# Patient Record
Sex: Female | Born: 1980 | Race: Black or African American | Hispanic: No | Marital: Single | Smoking: Current every day smoker
Health system: Southern US, Community
[De-identification: ages and names within clinical notes are randomized; demographics above are authoritative.]

## PROBLEM LIST (undated history)

## (undated) ENCOUNTER — Inpatient Hospital Stay (HOSPITAL_COMMUNITY): Payer: Self-pay

## (undated) DIAGNOSIS — A549 Gonococcal infection, unspecified: Secondary | ICD-10-CM

## (undated) DIAGNOSIS — D649 Anemia, unspecified: Secondary | ICD-10-CM

## (undated) DIAGNOSIS — F419 Anxiety disorder, unspecified: Secondary | ICD-10-CM

## (undated) DIAGNOSIS — F32A Depression, unspecified: Secondary | ICD-10-CM

## (undated) DIAGNOSIS — R0602 Shortness of breath: Secondary | ICD-10-CM

## (undated) DIAGNOSIS — L989 Disorder of the skin and subcutaneous tissue, unspecified: Secondary | ICD-10-CM

## (undated) DIAGNOSIS — K439 Ventral hernia without obstruction or gangrene: Secondary | ICD-10-CM

## (undated) DIAGNOSIS — K029 Dental caries, unspecified: Secondary | ICD-10-CM

## (undated) DIAGNOSIS — F329 Major depressive disorder, single episode, unspecified: Secondary | ICD-10-CM

## (undated) DIAGNOSIS — IMO0002 Reserved for concepts with insufficient information to code with codable children: Secondary | ICD-10-CM

## (undated) HISTORY — PX: DILATION AND CURETTAGE OF UTERUS: SHX78

## (undated) HISTORY — DX: Disorder of the skin and subcutaneous tissue, unspecified: L98.9

## (undated) HISTORY — PX: INDUCED ABORTION: SHX677

## (undated) HISTORY — DX: Dental caries, unspecified: K02.9

## (undated) HISTORY — PX: TUBAL LIGATION: SHX77

---

## 2011-02-01 NOTE — L&D Delivery Note (Signed)
Delivery Note At 8:10 AM a viable female was delivered via Vaginal, Spontaneous Delivery (Presentation: Left Occiput Anterior).  APGAR: 9, 9; weight 8 lb 6.9 oz (3825 g).   Placenta status: Intact, Spontaneous.  Cord: 3 vessels with the following complications: None.    Anesthesia: Epidural  Episiotomy: None  Lacerations: None Suture Repair: None Est. Blood Loss (mL):   Mom to postpartum.  Baby to stable and to mothers skin.Marland Kitchen  Kuneff, Renee 11/09/2011, 9:02 AM

## 2011-02-01 NOTE — L&D Delivery Note (Signed)
I have seen and examined this patient and I agree with the above. Elizabeth Mendoza 9:15 AM 11/11/2011

## 2011-03-15 ENCOUNTER — Encounter (HOSPITAL_BASED_OUTPATIENT_CLINIC_OR_DEPARTMENT_OTHER): Payer: Self-pay | Admitting: *Deleted

## 2011-03-15 ENCOUNTER — Emergency Department (HOSPITAL_BASED_OUTPATIENT_CLINIC_OR_DEPARTMENT_OTHER)
Admission: EM | Admit: 2011-03-15 | Discharge: 2011-03-15 | Disposition: A | Discharge status: Home / Self Care | Payer: Medicaid Other | Attending: Emergency Medicine | Admitting: Emergency Medicine

## 2011-03-15 DIAGNOSIS — F17200 Nicotine dependence, unspecified, uncomplicated: Secondary | ICD-10-CM | POA: Insufficient documentation

## 2011-03-15 DIAGNOSIS — O210 Mild hyperemesis gravidarum: Secondary | ICD-10-CM | POA: Insufficient documentation

## 2011-03-15 DIAGNOSIS — R109 Unspecified abdominal pain: Secondary | ICD-10-CM | POA: Insufficient documentation

## 2011-03-15 DIAGNOSIS — Z3201 Encounter for pregnancy test, result positive: Secondary | ICD-10-CM

## 2011-03-15 DIAGNOSIS — R110 Nausea: Secondary | ICD-10-CM

## 2011-03-15 LAB — URINALYSIS, ROUTINE W REFLEX MICROSCOPIC
Bilirubin Urine: NEGATIVE
Nitrite: NEGATIVE
Specific Gravity, Urine: 1.031 — ABNORMAL HIGH (ref 1.005–1.030)
Urobilinogen, UA: 1 mg/dL (ref 0.0–1.0)

## 2011-03-15 MED ORDER — PRENATAL COMPLETE 14-0.4 MG PO TABS: 1.0000 | ORAL_TABLET | Freq: Every morning | ORAL | Status: DC

## 2011-03-15 NOTE — ED Notes (Signed)
Karen Sofia PA-C at bedside 

## 2011-03-15 NOTE — ED Notes (Signed)
Pt c/o generalized abd pain x 2 weeks

## 2011-03-15 NOTE — Discharge Instructions (Signed)
Pregnancy If you are planning on getting pregnant, it is a good idea to make a preconception appointment with your care- giver to discuss having a healthy lifestyle before getting pregnant. Such as, diet, weight, exercise, taking prenatal vitamins especially folic acid (it helps prevent brain and spinal cord defects), avoiding alcohol, smoking and illegal drugs, medical problems (diabetes, convulsions), family history of genetic problems, working conditions and immunizations. It is better to have knowledge of these things and do something about them before getting pregnant. In your pregnancy, it is important to follow certain guidelines to have a healthy baby. It is very important to get good prenatal care and follow your caregiver's instructions. Prenatal care includes all the medical care you receive before your baby's birth. This helps to prevent problems during the pregnancy and childbirth. HOME CARE INSTRUCTIONS   Start your prenatal visits by the 12th week of pregnancy or before when possible. They are usually scheduled monthly at first. They are more often in the last 2 months before delivery. It is important that you keep your caregiver's appointments and follow your caregiver's instructions regarding medication use, exercise, and diet.   During pregnancy, you are providing food for you and your baby. Eat a regular, well-balanced diet. Choose foods such as meat, fish, milk and other dairy products, vegetables, fruits, whole-grain breads and cereals. Your caregiver will inform you of the ideal weight gain depending on your current height and weight. Drink lots of liquids. Try to drink 8 glasses of water a day.   Alcohol is associated with a number of birth defects including fetal alcohol syndrome. It is best to avoid alcohol completely. Smoking will cause low birth rate and prematurity. Use of alcohol and nicotine during your pregnancy also increases the chances that your child will be chemically  dependent later in their life and may contribute to SIDS (Sudden Infant Death Syndrome).   Do not use illegal drugs.   Only take prescription or over-the-counter medications that are recommended by your caregiver. Other medications can cause genetic and physical problems in the baby.   Morning sickness can often be helped by keeping soda crackers at the bedside. Eat a couple before arising in the morning.   A sexual relationship may be continued until near the end of pregnancy if there are no other problems such as early (premature) leaking of amniotic fluid from the membranes, vaginal bleeding, painful intercourse or belly (abdominal) pain.   Exercise regularly. Check with your caregiver if you are unsure of the safety of some of your exercises.   Do not use hot tubs, steam rooms or saunas. These increase the risk of fainting or passing out and hurting yourself and the baby. Swimming is OK for exercise. Get plenty of rest, including afternoon naps when possible especially in the third trimester.   Avoid toxic odors and chemicals.   Do not wear high heels. They may cause you to lose your balance and fall.   Do not lift over 5 pounds. If you do lift anything, lift with your legs and thighs, not your back.   Avoid long trips, especially in the third trimester.   If you have to travel out of the city or state, take a copy of your medical records with you.  SEEK IMMEDIATE MEDICAL CARE IF:   You develop an unexplained oral temperature above 102 F (38.9 C), or as your caregiver suggests.   You have leaking of fluid from the vagina. If leaking membranes are suspected, take   your temperature and inform your caregiver of this when you call.   There is vaginal spotting or bleeding. Notify your caregiver of the amount and how many pads are used.   You continue to feel sick to your stomach (nauseous) and have no relief from remedies suggested, or you throw up (vomit) blood or coffee ground like  materials.   You develop upper abdominal pain.   You have round ligament discomfort in the lower abdominal area. This still must be evaluated by your caregiver.   You feel contractions of the uterus.   You do not feel the baby move, or there is less movement than before.   You have painful urination.   You have abnormal vaginal discharge.   You have persistent diarrhea.   You get a severe headache.   You have problems with your vision.   You develop muscle weakness.   You feel dizzy and faint.   You develop shortness of breath.   You develop chest pain.   You have back pain that travels down to your leg and feet.   You feel irregular or a very fast heartbeat.   You develop excessive weight gain in a short period of time (5 pounds in 3 to 5 days).   You are involved with a domestic violence situation.  Document Released: 01/17/2005 Document Revised: 09/29/2010 Document Reviewed: 07/11/2008 [employer] Patient Information 2012 [employer]. 

## 2011-03-15 NOTE — ED Provider Notes (Signed)
Medical screening examination/treatment/procedure(s) were performed by non-physician practitioner and as supervising physician I was immediately available for consultation/collaboration.  Ethelda Chick, MD 03/15/11 [phone]

## 2011-03-15 NOTE — ED Provider Notes (Signed)
History     CSN: [id number]  Arrival date & time 03/15/11  1816   First MD Initiated Contact with Patient 03/15/11 1911      Chief Complaint  Patient presents with  . Abdominal Pain    (Consider location/radiation/quality/duration/timing/severity/associated sxs/prior treatment) Patient is a 31 y.o. female presenting with abdominal pain. The history is provided by the patient. No language interpreter was used.  Abdominal Pain The primary symptoms of the illness include abdominal pain and nausea. The primary symptoms of the illness do not include vomiting. Episode onset: 2 weeks. The onset of the illness was gradual. The problem has not changed since onset. Associated with: possible pregnancy. The patient states that she believes she is currently pregnant. The patient has not had a change in bowel habit. Symptoms associated with the illness do not include chills.  Pt reports she has been nauseated for 2 weeks.  Pt concerned that she could be pregnant.  Pt reports her boyfriend has been taking a supplement.  Pt concerned this could effect her.  History reviewed. No pertinent past medical history.  Past Surgical History  Procedure Date  . Induced abortion     History reviewed. No pertinent family history.  History  Substance Use Topics  . Smoking status: Current Everyday Smoker  . Smokeless tobacco: Not on file  . Alcohol Use: No    OB History    Grav Para Term Preterm Abortions TAB SAB Ect Mult Living                  Review of Systems  Constitutional: Negative for chills.  Gastrointestinal: Positive for nausea and abdominal pain. Negative for vomiting.  All other systems reviewed and are negative.    Allergies  Review of patient's allergies indicates no known allergies.  Home Medications  No current outpatient prescriptions on file.  BP 132/79  Pulse 101  Temp(Src) 98.7 F (37.1 C) (Oral)  Resp 16  Ht 5\' 4"  (1.626 m)  Wt 200 lb (90.719 kg)  BMI 34.33 kg/m2   SpO2 100%  LMP 02/15/2011  Physical Exam  Nursing note and vitals reviewed. Constitutional: She is oriented to person, place, and time. She appears well-developed and well-nourished.  HENT:  Head: Normocephalic and atraumatic.  Right Ear: External ear normal.  Left Ear: External ear normal.  Nose: Nose normal.  Mouth/Throat: Oropharynx is clear and moist.  Eyes: Conjunctivae and EOM are normal. Pupils are equal, round, and reactive to light.  Neck: Normal range of motion. Neck supple.  Cardiovascular: Normal rate and normal heart sounds.   Pulmonary/Chest: Effort normal and breath sounds normal.  Abdominal: Soft.  Musculoskeletal: Normal range of motion.  Neurological: She is alert and oriented to person, place, and time. She has normal reflexes.  Skin: Skin is warm.  Psychiatric: She has a normal mood and affect.    ED Course  Procedures (including critical care time)  Labs Reviewed  URINALYSIS, ROUTINE W REFLEX MICROSCOPIC - Abnormal; Notable for the following:    Specific Gravity, Urine 1.031 (*)    Ketones, ur 15 (*)    All other components within normal limits  PREGNANCY, URINE - Abnormal; Notable for the following:    Preg Test, Ur POSITIVE (*)    All other components within normal limits   No results found.   No diagnosis found.    MDM   Results for orders placed during the hospital encounter of 03/15/11  URINALYSIS, ROUTINE W REFLEX MICROSCOPIC  Component Value Range   Color, Urine YELLOW  YELLOW    APPearance CLEAR  CLEAR    Specific Gravity, Urine 1.031 (*) 1.005 - 1.030    pH 6.0  5.0 - 8.0    Glucose, UA NEGATIVE  NEGATIVE (mg/dL)   Hgb urine dipstick NEGATIVE  NEGATIVE    Bilirubin Urine NEGATIVE  NEGATIVE    Ketones, ur 15 (*) NEGATIVE (mg/dL)   Protein, ur NEGATIVE  NEGATIVE (mg/dL)   Urobilinogen, UA 1.0  0.0 - 1.0 (mg/dL)   Nitrite NEGATIVE  NEGATIVE    Leukocytes, UA NEGATIVE  NEGATIVE   PREGNANCY, URINE      Component Value Range    Preg Test, Ur POSITIVE (*) NEGATIVE    No results found.     Pt advised to go to [facility] hospital if any problems.   Pt started on prenatal vitamins    Langston Masker, [place] 03/15/11 1945  Langston Masker, [place] 03/15/11 [phone]

## 2011-04-11 ENCOUNTER — Emergency Department (HOSPITAL_COMMUNITY): Payer: Medicaid Other

## 2011-04-11 ENCOUNTER — Encounter (HOSPITAL_COMMUNITY): Payer: Self-pay | Admitting: *Deleted

## 2011-04-11 ENCOUNTER — Emergency Department (HOSPITAL_COMMUNITY)
Admission: EM | Admit: 2011-04-11 | Discharge: 2011-04-11 | Disposition: A | Discharge status: Home / Self Care | Payer: Medicaid Other | Attending: Emergency Medicine | Admitting: Emergency Medicine

## 2011-04-11 DIAGNOSIS — Z1389 Encounter for screening for other disorder: Secondary | ICD-10-CM | POA: Insufficient documentation

## 2011-04-11 DIAGNOSIS — N898 Other specified noninflammatory disorders of vagina: Secondary | ICD-10-CM | POA: Insufficient documentation

## 2011-04-11 DIAGNOSIS — Z3490 Encounter for supervision of normal pregnancy, unspecified, unspecified trimester: Secondary | ICD-10-CM

## 2011-04-11 DIAGNOSIS — N949 Unspecified condition associated with female genital organs and menstrual cycle: Secondary | ICD-10-CM | POA: Insufficient documentation

## 2011-04-11 DIAGNOSIS — R109 Unspecified abdominal pain: Secondary | ICD-10-CM | POA: Insufficient documentation

## 2011-04-11 DIAGNOSIS — Z363 Encounter for antenatal screening for malformations: Secondary | ICD-10-CM | POA: Insufficient documentation

## 2011-04-11 LAB — URINALYSIS, ROUTINE W REFLEX MICROSCOPIC
Bilirubin Urine: NEGATIVE
Glucose, UA: NEGATIVE mg/dL
Ketones, ur: NEGATIVE mg/dL
Leukocytes, UA: NEGATIVE
Protein, ur: NEGATIVE mg/dL

## 2011-04-11 LAB — WET PREP, GENITAL: Clue Cells Wet Prep HPF POC: NONE SEEN

## 2011-04-11 LAB — HCG, QUANTITATIVE, PREGNANCY: hCG, Beta Chain, Quant, S: 151329 m[IU]/mL — ABNORMAL HIGH (ref <5)

## 2011-04-11 NOTE — ED Notes (Signed)
Pt alert and oriented x4. Respirations even and unlabored, bilateral symmetrical rise and fall of chest. Skin warm and dry. In no acute distress. Denies needs.   

## 2011-04-11 NOTE — ED Notes (Addendum)
Pt reports abdominal pain that started approximately 1 week ago. States is pregnant, but has not seen OBGYN and unsure how far along she is. States LMP in January. Has had 7 pregnancies, 3 abortions, 3 living children. Reports foul smell/white vaginal discharge.

## 2011-04-11 NOTE — ED Provider Notes (Signed)
History     CSN: [id number]  Arrival date & time 04/11/11  1050   First MD Initiated Contact with Patient 04/11/11 1152      Chief Complaint  Patient presents with  . Abdominal Pain    (Consider location/radiation/quality/duration/timing/severity/associated sxs/prior treatment) HPI  Patient presents to ER complaining of lower abdominal cramping, leg cramping and pregnancy. Patient states she was seen in ER last month for nausea and "belly not feeling right" and was told she was pregnant. Patient states she is "currently working on getting set up with an ObGyn" but has not seen one for this pregnancy. Patient is [id number]. Patient states that last night she had acute onset lower abdominal cramping and cramping in thighs that lasted a few minutes and then resolved. Since onset mild intermittent return of lower abdominal cramping. She denies vaginal bleeding but states she has a thickened foul smelling vaginal d/c. Patient states her last "really normal" LMP was in December but that in January she did have 2 days of heavy bleeding on the 17th and 18th but states no period in February or March as of today's date.   History reviewed. No pertinent past medical history.  Past Surgical History  Procedure Date  . Induced abortion     No family history on file.  History  Substance Use Topics  . Smoking status: Former Smoker    Types: Cigarettes  . Smokeless tobacco: Not on file  . Alcohol Use: No    OB History    Grav Para Term Preterm Abortions TAB SAB Ect Mult Living   1               Review of Systems  All other systems reviewed and are negative.    Allergies  Review of patient's allergies indicates no known allergies.  Home Medications   Current Outpatient Rx  Name Route Sig Dispense Refill  . PRENATAL COMPLETE 14-0.4 MG PO TABS Oral Take 1 tablet by mouth every morning. 60 each 0    BP 105/56  Pulse 93  Temp(Src) 98 F (36.7 C) (Oral)  Resp 16  Ht 5\' 4"  (1.626  m)  Wt 216 lb (97.977 kg)  BMI 37.08 kg/m2  SpO2 100%  LMP 02/15/2011  Physical Exam  Nursing note and vitals reviewed. Constitutional: She is oriented to person, place, and time. She appears well-developed and well-nourished. No distress.  HENT:  Head: Normocephalic and atraumatic.  Eyes: Conjunctivae are normal.  Neck: Normal range of motion. Neck supple.  Cardiovascular: Normal rate, regular rhythm, normal heart sounds and intact distal pulses.  Exam reveals no gallop and no friction rub.   No murmur heard. Pulmonary/Chest: Effort normal and breath sounds normal. No respiratory distress. She has no wheezes. She has no rales. She exhibits no tenderness.  Abdominal: Soft. Bowel sounds are normal. She exhibits no distension and no mass. There is no tenderness. There is no rebound and no guarding.  Genitourinary: Vagina normal. There is no rash, tenderness or lesion on the right labia. There is no rash, tenderness or lesion on the left labia. Cervix exhibits discharge. Cervix exhibits no motion tenderness and no friability. Right adnexum displays no mass, no tenderness and no fullness. Left adnexum displays no mass, no tenderness and no fullness. No tenderness around the vagina.       Cervical os closed. Scant white d/c. Uterus non tender   Musculoskeletal: Normal range of motion. She exhibits no edema and no tenderness.  Neurological: She is alert  and oriented to person, place, and time.  Skin: Skin is warm and dry. No rash noted. She is not diaphoretic. No erythema.  Psychiatric: She has a normal mood and affect.    ED Course  Procedures (including critical care time)  Labs Reviewed  URINALYSIS, ROUTINE W REFLEX MICROSCOPIC - Abnormal; Notable for the following:    Color, Urine AMBER (*) BIOCHEMICALS MAY BE AFFECTED BY COLOR   APPearance CLOUDY (*)    All other components within normal limits  POCT PREGNANCY, URINE - Abnormal; Notable for the following:    Preg Test, Ur POSITIVE  (*)    All other components within normal limits  WET PREP, GENITAL  GC/CHLAMYDIA PROBE AMP, GENITAL  HCG, QUANTITATIVE, PREGNANCY   US Ob Comp Less 14 Wks  04/11/2011  *RADIOLOGY REPORT*  Clinical Data: Pelvic pain with positive pregnancy test.  OBSTETRIC <14 WK [place] AND TRANSVAGINAL OB US  Technique:  Both transabdominal and transvaginal ultrasound examinations were performed for complete evaluation of the gestation as well as the maternal uterus, adnexal regions, and pelvic cul-de-sac.  Transvaginal technique was performed to assess early pregnancy.  Comparison:  None.  Intrauterine gestational sac:  Single gestational sac. Visualized/normal in shape. Yolk sac: Visualized Embryo: Visualized Cardiac Activity: Visualized Heart Rate: 162 bpm  CRL: 17   mm  8   w  1   d          [place] EDC: 11/20/2011  Maternal uterus/adnexae: No evidence for subchorionic hemorrhage. Two posterior intramural fibroids are identified, measuring up to about the 4.5 cm in maximum diameter. Corpus luteum cyst identified in the right ovary. Left ovary is unremarkable.  IMPRESSION: Single living intrauterine gestation at estimated 8-week-1-day gestational age by crown-rump length.  Original Report Authenticated By: ERIC A. MANSELL, M.D.   US Ob Transvaginal  04/11/2011  *RADIOLOGY REPORT*  Clinical Data: Pelvic pain with positive pregnancy test.  OBSTETRIC <14 WK [place] AND TRANSVAGINAL OB US  Technique:  Both transabdominal and transvaginal ultrasound examinations were performed for complete evaluation of the gestation as well as the maternal uterus, adnexal regions, and pelvic cul-de-sac.  Transvaginal technique was performed to assess early pregnancy.  Comparison:  None.  Intrauterine gestational sac:  Single gestational sac. Visualized/normal in shape. Yolk sac: Visualized Embryo: Visualized Cardiac Activity: Visualized Heart Rate: 162 bpm  CRL: 17   mm  8   w  1   d          [place] EDC: 11/20/2011  Maternal uterus/adnexae: No evidence for  subchorionic hemorrhage. Two posterior intramural fibroids are identified, measuring up to about the 4.5 cm in maximum diameter. Corpus luteum cyst identified in the right ovary. Left ovary is unremarkable.  IMPRESSION: Single living intrauterine gestation at estimated 8-week-1-day gestational age by crown-rump length.  Original Report Authenticated By: ERIC A. MANSELL, M.D.     1. Normal IUP (intrauterine pregnancy) on prenatal ultrasound       MDM  Single living IUP at 8 weeks with abdomen soft and non tender and no CMT or adnexal TTP. Intermittent cramping to be followed up with prenatal visit with Ob[facility]yn at [facility] or acute changes with abdominal pain seen either at [facility] or [facility] ER. Patient is agreeable to plan.         Jenness Corner, [place] 04/11/11 1423

## 2011-04-11 NOTE — ED Provider Notes (Signed)
Medical screening examination/treatment/procedure(s) were performed by non-physician practitioner and as supervising physician I was immediately available for consultation/collaboration.   Glynn Octave, MD 04/11/11 1932

## 2011-04-11 NOTE — Discharge Instructions (Signed)
Start a pre natal vitamin. You may alternate between low dose ibuprofen and tylenol for abdominal cramping and pain. Call [facility] today to schedule follow up pre natal visits but go to [facility] hospital for increasing abdominal cramping for further evaluation.   Birth Defects, Prevention A birth defect is an abnormal condition present at birth (congenital). Only 2% to 3% of babies may have a serious birth defect. Some birth defects can be treated with medications. Others can be treated with diet or surgery. Many birth defects can be prevented but some cannot. Death from a birth defect is rare.  Many birth defects can be found through screening during pregnancy. However, screening is not 100% accurate:  A test may show positive results for a defect when the child is normal (false positive).   A test result may show there is no problem when there really is a defect (false negative).  Some of the screening tests include:  An ultrasound.   Blood tests for the mother.   A test of the chromosomes (chorionic villus sampling).   Amniotic fluid testing (amniocentesis).   A test of blood taken from the umbilical cord of the fetus (cordocentesis).  CAUSES  Most birth defects have unknown causes. However, some defects have been traced to specific causes, such as:  Genetic defects can be caused by abnormal genes or chromosomes. These include:   Cystic fibrosis.   Sickle cell disease.   Tay-Sachs Disease.   Fragile X syndrome.   Down syndrome.   Thalassemia.   Infectious diseases. These include:   [place] measles.   Cytomegalovirus.   Toxoplasmosis.   Parvovirus.   Chickenpox.   Syphilis.   Herpes virus.   Uncontrolled diabetes before and during pregnancy.   Reoccurring convulsions (epilepsy).   Chemical agents such as:   Alcohol, which can cause lifelong physical and mental disabilities in the baby. This is called fetal alcohol syndrome (FAS).   Mercury, which  is found in certain fish (shark, swordfish, and tilefish).   Lead, which is usually found in paint.   Illegal drugs.   Cigarette smoke.   Certain prescription, over-the-counter, and herbal medications. If you are taking a prescription medication, talk to your caregiver before stopping it.   Radiation.   Chemotherapy.   Lack of folic acid, a B vitamin, before and during pregnancy.   Lack of iodine before and during your pregnancy.  RISK FACTORS  Age of mother is 35 years or older.   Age of father is 50 years or older.   Mother with a history of diabetes.   Mother with a history of epilepsy.   A history of a birth defect in the parents.   A previous baby with a birth defect.   A family history of a birth defect.  HOME CARE INSTRUCTIONS AND PREVENTION  See your caregiver as soon as you think you are pregnant.   Do not drink alcohol before or during pregnancy.   Do not take illegal drugs.   Do not smoke before (when possible) and during the pregnancy.   Before getting pregnant, begin taking a multivitamin as directed by your caregiver.   Take folic acid (0.4 mg) before and during pregnancy. It helps prevent spina bifida and a condition where the brain is undeveloped (anencephaly). Multivitamins usually contain folic acid. It is also found in most breakfast cereals.   Follow the directions from your caregiver about taking iodine. Your caregiver may suggest that you take 150 micrograms of iodine  before and during your pregnancy. Multivitamins usually contain iodine.   Do not take more than 5,000 international units of vitamin A a day.   Eat a balanced and nutritious diet.   Avoid fish that contain mercury (shark, swordfish, and tilefish).   Avoid eating soft cheeses before and during the pregnancy.   Talk to your doctor before taking any prescription, over-the-counter, or herbal medications.   If there is a history of birth defects in the mother, father, previously  born children, or family members, you may wish to have genetic counseling before getting pregnant.   If you are diabetic, make sure your diabetes is controlled before and during your pregnancy.   Get immunizations before becoming pregnant. You may need to be immunized for [place] measles, mumps, red measles, chickenpox, hepatitis, and other diseases. Immunizations received when you are pregnant may be harmful to the baby.   If you are going to have a baby with a birth defect that will need treatment, try to have your baby at a hospital. That way, doctors, intensive care facilities, and equipment to treat and care for the baby's problem will be available.  MAKE SURE YOU:   Understand these instructions.   Will watch your condition.   Will get help right away if you are not doing well or get worse.  Document Released: 11/14/2006 Document Revised: 01/06/2011 Document Reviewed: 05/02/2008 [facility] Patient Information 2012 [place], [place].

## 2011-04-11 NOTE — ED Notes (Signed)
Ultrasound at bedside

## 2011-04-12 LAB — GC/CHLAMYDIA PROBE AMP, GENITAL
Chlamydia, DNA Probe: NEGATIVE
GC Probe Amp, Genital: NEGATIVE

## 2011-05-24 ENCOUNTER — Encounter (HOSPITAL_COMMUNITY): Payer: Self-pay | Admitting: *Deleted

## 2011-05-24 ENCOUNTER — Emergency Department (HOSPITAL_COMMUNITY)
Admission: EM | Admit: 2011-05-24 | Discharge: 2011-05-24 | Disposition: A | Discharge status: Home / Self Care | Payer: Medicaid Other | Attending: Emergency Medicine | Admitting: Emergency Medicine

## 2011-05-24 DIAGNOSIS — R109 Unspecified abdominal pain: Secondary | ICD-10-CM | POA: Insufficient documentation

## 2011-05-24 DIAGNOSIS — O26899 Other specified pregnancy related conditions, unspecified trimester: Secondary | ICD-10-CM

## 2011-05-24 DIAGNOSIS — M549 Dorsalgia, unspecified: Secondary | ICD-10-CM | POA: Insufficient documentation

## 2011-05-24 DIAGNOSIS — O99891 Other specified diseases and conditions complicating pregnancy: Secondary | ICD-10-CM | POA: Insufficient documentation

## 2011-05-24 DIAGNOSIS — N898 Other specified noninflammatory disorders of vagina: Secondary | ICD-10-CM | POA: Insufficient documentation

## 2011-05-24 DIAGNOSIS — R10819 Abdominal tenderness, unspecified site: Secondary | ICD-10-CM | POA: Insufficient documentation

## 2011-05-24 LAB — URINALYSIS, ROUTINE W REFLEX MICROSCOPIC
Bilirubin Urine: NEGATIVE
Nitrite: NEGATIVE
Protein, ur: NEGATIVE mg/dL
Specific Gravity, Urine: 1.025 (ref 1.005–1.030)
Urobilinogen, UA: 1 mg/dL (ref 0.0–1.0)

## 2011-05-24 LAB — WET PREP, GENITAL
Trich, Wet Prep: NONE SEEN
Yeast Wet Prep HPF POC: NONE SEEN

## 2011-05-24 MED ORDER — ACETAMINOPHEN 325 MG PO TABS
650.0000 mg | ORAL_TABLET | Freq: Once | ORAL | Status: AC
  Administered 2011-05-24: 650 mg via ORAL

## 2011-05-24 MED ORDER — ACETAMINOPHEN 325 MG PO TABS
ORAL_TABLET | ORAL | Status: AC
  Filled 2011-05-24: qty 2

## 2011-05-24 NOTE — ED Provider Notes (Signed)
History     CSN: [id number]  Arrival date & time 05/24/11  0714   First MD Initiated Contact with Patient 05/24/11 0719      Chief Complaint  Patient presents with  . Abdominal Pain    (Consider location/radiation/quality/duration/timing/severity/associated sxs/prior treatment) HPI Comments: Elizabeth Mendoza is a 31 y.o. female presents with 2 days of lower abdominal cramping with pain radiating to the back. She denies dysuria, urinary frequency, vaginal discharge or vaginal bleeding. She is approximately [redacted] weeks pregnant based on a ultrasound done in the emergency department on 04/11/11. She has not gotten prenatal care yet. She does take prenatal vitamins. She is gravida 7, with 3 living children, and 3 abortions-spontaneous. She denies fever, chills, nausea, vomiting.  The history is provided by the patient.    History reviewed. No pertinent past medical history.  Past Surgical History  Procedure Date  . Induced abortion     No family history on file.  History  Substance Use Topics  . Smoking status: Former Smoker    Types: Cigarettes  . Smokeless tobacco: Not on file  . Alcohol Use: No    OB History    Grav Para Term Preterm Abortions TAB SAB Ect Mult Living   1               Review of Systems  All other systems reviewed and are negative.    Allergies  Review of patient's allergies indicates no known allergies.  Home Medications   No current outpatient prescriptions on file.  BP 115/68  Pulse 93  Temp(Src) 98.4 F (36.9 C) (Oral)  Resp 18  SpO2 99%  LMP 02/15/2011  Physical Exam  Nursing note and vitals reviewed. Constitutional: She is oriented to person, place, and time. She appears well-developed and well-nourished.  HENT:  Head: Normocephalic and atraumatic.  Eyes: Conjunctivae and EOM are normal. Pupils are equal, round, and reactive to light.  Neck: Normal range of motion and phonation normal. Neck supple.  Cardiovascular: Normal rate, regular  rhythm and intact distal pulses.   Pulmonary/Chest: Effort normal and breath sounds normal. She exhibits no tenderness.  Abdominal: Soft. She exhibits no distension. There is tenderness (Mild suprapubic tenderness). There is no guarding.  Genitourinary:       Pelvic examination: Normal external female genitalia. No rash. Trivial vaginal discharge, white color. Cervix thick and closed. No cervicitis or cervical motion tenderness. Uterus gravid, consistent with an 18 week pregnancy. No uterine tenderness, adnexal mass or adnexal tenderness  Musculoskeletal: Normal range of motion.  Neurological: She is alert and oriented to person, place, and time. She has normal strength. She exhibits normal muscle tone.  Skin: Skin is warm and dry.  Psychiatric: She has a normal mood and affect. Her behavior is normal. Judgment and thought content normal.    ED Course  Procedures (including critical care time)  Patient complains of headache during ED course and was treated with Tylenol.   Labs Reviewed  URINALYSIS, ROUTINE W REFLEX MICROSCOPIC - Abnormal; Notable for the following:    APPearance CLOUDY (*)    Ketones, ur TRACE (*)    All other components within normal limits  WET PREP, GENITAL - Abnormal; Notable for the following:    Clue Cells Wet Prep HPF POC FEW (*)    WBC, Wet Prep HPF POC FEW (*)    All other components within normal limits  URINE CULTURE  GC/CHLAMYDIA PROBE AMP, GENITAL   No results found.   1. Abdominal  pain in pregnancy       MDM  Nonspecific pelvic pain with incidental pregnancy. No apparent pregnancy complication. Doubt UTI, metabolic instability or dehydration. She is stable for discharge with outpatient management. She will need referral for obstetric care.  Plan: Home Medications- oral vitamin; Home Treatments- Tylenol prn pain; Recommended follow up- OB asap        Flint Melter, MD 05/24/11 1110

## 2011-05-24 NOTE — ED Notes (Signed)
Pt refused I&O cath-states "im already in pain, im not gonna have that".  Explained to pt the reason for I&O cath.  Pt still refused.  Dr. Effie Shy notified.  Pt instructed to provide clean catch urine.  Verbalizes understanding of the importance of providing clean catch urine.  Pt ambulated to the BR with steady gait.  No difficulties.

## 2011-05-24 NOTE — ED Notes (Signed)
Pt reports lower abd pain x 2 days.  Pt is ~ 4 mos pregnant.  Pt reports at times pain radiates down to her legs.  Pt also reports lower back pain.  Pt denies any n/v/d or any prenatal care at this time.  Pt was seen here for vaginal discharge last month, was given rx for prenatal vitamins but states that she cannot take them d/t nausea.

## 2011-05-24 NOTE — Discharge Instructions (Signed)
Use the attached resource guide to help you find an obstetrician to see for pregnancy care. For pain. You can take Tylenol for pain. Start taking a daily multivitamin. Drink plenty of fluids. Your pregnancy is approximately 18 weeks today.  Abdominal Pain Abdominal pain can be caused by many things. Your caregiver decides the seriousness of your pain by an examination and possibly blood tests and X-rays. Many cases can be observed and treated at home. Most abdominal pain is not caused by a disease and will probably improve without treatment. However, in many cases, more time must pass before a clear cause of the pain can be found. Before that point, it may not be known if you need more testing, or if hospitalization or surgery is needed. HOME CARE INSTRUCTIONS   Do not take laxatives unless directed by your caregiver.   Take pain medicine only as directed by your caregiver.   Only take over-the-counter or prescription medicines for pain, discomfort, or fever as directed by your caregiver.   Try a clear liquid diet (broth, tea, or water) for as long as directed by your caregiver. Slowly move to a bland diet as tolerated.  SEEK IMMEDIATE MEDICAL CARE IF:   The pain does not go away.   You have a fever.   You keep throwing up (vomiting).   The pain is felt only in portions of the abdomen. Pain in the right side could possibly be appendicitis. In an adult, pain in the left lower portion of the abdomen could be colitis or diverticulitis.   You pass bloody or black tarry stools.  MAKE SURE YOU:   Understand these instructions.   Will watch your condition.   Will get help right away if you are not doing well or get worse.  Document Released: 10/27/2004 Document Revised: 01/06/2011 Document Reviewed: 09/05/2007 [facility] Patient Information 2012 [place], [place].Abdominal Pain During Pregnancy Belly (abdominal) pain is common during pregnancy. Most of the time, it is not a serious problem.  Other times, it can be a sign that something is wrong with the pregnancy. Always tell your doctor if you have belly pain. HOME CARE For mild pain:  Do not have sex (intercourse) or put anything in your vagina until you feel better.   Rest until your pain stops. If your pain lasts longer than 1 hour, call your doctor.   Drink clear fluids if you feel sick to your stomach (nauseous).   Do not eat solid food until you feel better.   Only take medicine as told by your doctor.   Keep all doctor visits as told.  GET HELP RIGHT AWAY IF:   You are bleeding, leaking fluid, or pieces of tissue come out of your vagina.   You have more pain or cramping.   You keep throwing up (vomiting).   You have pain when you pee (urinate) or have blood in your pee.   You have a fever.   You do not feel your baby moving as much.   You feel very weak or feel like passing out.   You have trouble breathing, with or without belly pain.   You have a very bad headache and belly pain.   You have fluid leaking from your vagina and belly pain.   You keep having watery poop (diarrhea).   Your belly pain does not go away after resting, or the pain gets worse.  MAKE SURE YOU:   Understand these instructions.   Will watch your condition.  Will get help right away if you are not doing well or get worse.  Document Released: 01/05/2009 Document Revised: 01/06/2011 Document Reviewed: 08/13/2010 [facility] Patient Information 2012 [place], [place].  RESOURCE GUIDE  Dental Problems  Patients with Medicaid: [facility] [phone] [address]                                           [phone] [address] Phone:  [phone]                                                  Phone:  [phone]  If unable to pay or uninsured, contact:  [facility] or [facility]. to become qualified for the adult dental clinic.  Chronic Pain Problems Contact  [facility]  [phone] Patients need to be referred by their primary care doctor.  Insufficient Money for Medicine Contact United Way:  call "211" or [facility] [phone].  No Primary Care Doctor Call [facility]  [phone] Other agencies that provide inexpensive medical care    [facility]  [phone]    [facility]  [phone]    [facility]  [phone]    [facility]  [phone]    [facility]  [phone]    [facility]  [phone]  Psychological Services [facility]  [phone] [facility] Services  [phone] [facility] Mental Health   [phone] (emergency services [phone])  Substance Abuse Resources [facility]  [phone] [facility] [phone] [facility] [phone] Floydene Flock [phone] Residential & Outpatient Substance Abuse Program  [phone]  Abuse/Neglect [facility] Child Abuse Hotline 207-862-3946 [facility] Child Abuse Hotline [phone] (After Hours)  Emergency Shelter [facility] 8720128548  Maternity Homes [facility] [place] [facility] [phone] [facility] Services [phone]  MRSA Hotline #:   310-350-8433    [facility] Resources  [facility]     [employer]                          [facility] Dept. [address] [place]                       [address]      [address]  [place]                                                Cristobal Goldmann Phone:  [phone]  Phone:  [phone]                 Phone:  [phone]  [facility] Mental Health Phone:  [phone]  [facility] Child Abuse Hotline 408-407-6341 [phone] (After Hours)

## 2011-05-25 LAB — URINE CULTURE

## 2011-05-25 LAB — GC/CHLAMYDIA PROBE AMP, GENITAL
Chlamydia, DNA Probe: NEGATIVE
GC Probe Amp, Genital: NEGATIVE

## 2011-06-15 ENCOUNTER — Encounter (HOSPITAL_COMMUNITY): Payer: Self-pay

## 2011-06-15 ENCOUNTER — Inpatient Hospital Stay (HOSPITAL_COMMUNITY)
Admission: AD | Admit: 2011-06-15 | Discharge: 2011-06-15 | Disposition: A | Discharge status: Home / Self Care | Payer: Medicaid Other | Source: Ambulatory Visit | Attending: Obstetrics & Gynecology | Admitting: Obstetrics & Gynecology

## 2011-06-15 DIAGNOSIS — M549 Dorsalgia, unspecified: Secondary | ICD-10-CM

## 2011-06-15 DIAGNOSIS — O99891 Other specified diseases and conditions complicating pregnancy: Secondary | ICD-10-CM | POA: Insufficient documentation

## 2011-06-15 DIAGNOSIS — IMO0002 Reserved for concepts with insufficient information to code with codable children: Secondary | ICD-10-CM | POA: Insufficient documentation

## 2011-06-15 DIAGNOSIS — O26899 Other specified pregnancy related conditions, unspecified trimester: Secondary | ICD-10-CM

## 2011-06-15 LAB — URINALYSIS, ROUTINE W REFLEX MICROSCOPIC
Glucose, UA: NEGATIVE mg/dL
Hgb urine dipstick: NEGATIVE
Ketones, ur: NEGATIVE mg/dL
Protein, ur: NEGATIVE mg/dL
pH: 6.5 (ref 5.0–8.0)

## 2011-06-15 NOTE — MAU Note (Signed)
Patient states she has had no prenatal care. Has been having right lower back pain for several days. Denies any bleeding or unusual vaginal discharge. Wants to know sex of baby.

## 2011-06-15 NOTE — Discharge Instructions (Signed)
Take Tylenol 325 mg 2 tablets by mouth every 4 hours if needed for pain. Can use an ice pack to your back for 10 minutes twice a day.  Do not place ice pack directly on your skin. Do not carry a heavy backpack.  Prenatal Care [facility]    [facility]  [facility]  [phone]     Phone: [phone]          [facility]                      [facility]  @[place]     Phone: [phone]  Phone: [phone]         [facility]     Phone: [phone]  Phone: [phone]           [facility] for [facility] @ [place]                hone: [phone]  Phone: [phone]         [facility] [address] Francoise Ceo      Phone: [phone]  Phone: [phone]         [facility] Dept.                Phone: [phone]  Women's Health   Phone:[phone]    Family [address] [place])          Phone: [phone] [facility] Physicians [facility] &Infertility   Phone: [phone]

## 2011-06-15 NOTE — MAU Provider Note (Signed)
History     CSN: [id number]  Arrival date and time: 06/15/11 0841   First Provider Initiated Contact with Patient 06/15/11 1020      Chief Complaint  Patient presents with  . Back Pain   HPI Elizabeth Mendoza 31 y.o. [redacted]w[redacted]d  Comes to MAU with right sided back pain and waking up sweating.  Is worried about the pregnancy.  Wants to know sex of the baby.  Has not yet started prenatal care.  OB History    Grav Para Term Preterm Abortions TAB SAB Ect Mult Living   7 3 3  0 3 3 0 0 0 3      Past Medical History  Diagnosis Date  . No pertinent past medical history     Past Surgical History  Procedure Date  . Induced abortion   . Dilation and curettage of uterus     Family History  Problem Relation Age of Onset  . Anesthesia problems Neg Hx     History  Substance Use Topics  . Smoking status: Former Smoker    Types: Cigarettes  . Smokeless tobacco: Not on file  . Alcohol Use: No    Allergies: No Known Allergies  Prescriptions prior to admission  Medication Sig Dispense Refill  . acetaminophen (TYLENOL) 325 MG tablet Take 650 mg by mouth every 6 (six) hours as needed. For migraine.      . hydrocortisone cream 0.5 % Apply 1 application topically daily. For eczema.      . Prenatal Vit-Fe Fumarate-FA (PRENATAL MULTIVITAMIN) TABS Take 1 tablet by mouth daily.        Review of Systems  Constitutional:       Awakens sweating  Gastrointestinal: Negative for nausea, vomiting and abdominal pain.  Genitourinary: Negative for dysuria.  Musculoskeletal: Positive for back pain.   Physical Exam   Blood pressure 110/68, pulse 96, temperature 97.5 F (36.4 C), temperature source Oral, resp. rate 20, height 5' 3.5" (1.613 m), weight 206 lb 9.6 oz (93.713 kg), last menstrual period 02/15/2011, SpO2 100.00%.  Physical Exam  Nursing note and vitals reviewed. Constitutional: She is oriented to person, place, and time. She appears well-developed and well-nourished.       Moves well  without assistance.  HENT:  Head: Normocephalic.  Eyes: EOM are normal.  Neck: Neck supple.  GI: Soft. There is no tenderness.       Fundus one FB below umbilicus FHT heard with doppler  Musculoskeletal: Normal range of motion.       Back pain in mid back on right side with palpation.  No pain in other areas.  Neurological: She is alert and oriented to person, place, and time.  Skin: Skin is warm and dry.  Psychiatric: She has a normal mood and affect.    MAU Course  Procedures  MDM Results for orders placed during the hospital encounter of 06/15/11 (from the past 24 hour(s))  URINALYSIS, ROUTINE W REFLEX MICROSCOPIC     Status: Abnormal   Collection Time   06/15/11  9:00 AM      Component Value Range   Color, Urine YELLOW  YELLOW    APPearance CLEAR  CLEAR    Specific Gravity, Urine 1.025  1.005 - 1.030    pH 6.5  5.0 - 8.0    Glucose, UA NEGATIVE  NEGATIVE (mg/dL)   Hgb urine dipstick NEGATIVE  NEGATIVE    Bilirubin Urine NEGATIVE  NEGATIVE    Ketones, ur NEGATIVE  NEGATIVE (mg/dL)  Protein, ur NEGATIVE  NEGATIVE (mg/dL)   Urobilinogen, UA 2.0 (*) 0.0 - 1.0 (mg/dL)   Nitrite NEGATIVE  NEGATIVE    Leukocytes, UA NEGATIVE  NEGATIVE    Discussed at length with client - not indicated for ultrasound today.  Likely back pain is due to heavy lifting.   Discussed getting a muscle relaxer, but client did not want prescription or medications today.  Will take tylenol and use an ice pack at home.  Ready for discharge.  Was worried about sweating when she wakes up.  Worried about low blood but did not want to have blood drawn today.  Taking a prenatal vitamin.   Assessment and Plan  Back strain in pregnancy  Plan Ice pack to back bid Take Tylenol 325 mg 2 tablets by mouth every 4 hours if needed for pain. Begin prenatal care as soon as possible.  Anticipates having medicaid later this week.   Eli Adami 06/15/2011, 10:21 AM

## 2011-06-15 NOTE — MAU Note (Signed)
Pt in c/o lower right sided back pain that started a few days ago.  States she goes to school and carries a heavy backpack on right side of back.  Reports an occasional sharp pain down right side worse with movement.  Denies any bleeding or discharge.

## 2011-11-09 ENCOUNTER — Inpatient Hospital Stay (HOSPITAL_COMMUNITY)
Admission: AD | Admit: 2011-11-09 | Discharge: 2011-11-10 | DRG: 775 | Disposition: A | Discharge status: Home / Self Care | Payer: Medicaid Other | Source: Ambulatory Visit | Attending: Obstetrics & Gynecology | Admitting: Obstetrics & Gynecology

## 2011-11-09 ENCOUNTER — Encounter (HOSPITAL_COMMUNITY): Payer: Self-pay

## 2011-11-09 ENCOUNTER — Inpatient Hospital Stay (HOSPITAL_COMMUNITY): Payer: Medicaid Other | Admitting: Anesthesiology

## 2011-11-09 ENCOUNTER — Encounter (HOSPITAL_COMMUNITY): Payer: Self-pay | Admitting: Anesthesiology

## 2011-11-09 DIAGNOSIS — IMO0001 Reserved for inherently not codable concepts without codable children: Secondary | ICD-10-CM

## 2011-11-09 HISTORY — DX: Shortness of breath: R06.02

## 2011-11-09 LAB — RPR: RPR Ser Ql: NONREACTIVE

## 2011-11-09 LAB — CBC
MCH: 29.5 pg (ref 26.0–34.0)
MCHC: 34.1 g/dL (ref 30.0–36.0)
MCV: 86.7 fL (ref 78.0–100.0)
Platelets: 285 10*3/uL (ref 150–400)
RBC: 3.08 MIL/uL — ABNORMAL LOW (ref 3.87–5.11)
RDW: 14.9 % (ref 11.5–15.5)

## 2011-11-09 LAB — OB RESULTS CONSOLE HIV ANTIBODY (ROUTINE TESTING): HIV: NONREACTIVE

## 2011-11-09 LAB — GROUP B STREP BY PCR: Group B strep by PCR: NEGATIVE

## 2011-11-09 MED ORDER — OXYTOCIN BOLUS FROM INFUSION
500.0000 mL | Freq: Once | INTRAVENOUS | Status: DC
  Filled 2011-11-09: qty 500

## 2011-11-09 MED ORDER — WITCH HAZEL-GLYCERIN EX PADS
1.0000 "application " | MEDICATED_PAD | CUTANEOUS | Status: DC | PRN
  Administered 2011-11-09: 1 via TOPICAL

## 2011-11-09 MED ORDER — EPHEDRINE 5 MG/ML INJ
10.0000 mg | INTRAVENOUS | Status: DC | PRN
  Filled 2011-11-09: qty 4

## 2011-11-09 MED ORDER — DIPHENHYDRAMINE HCL 50 MG/ML IJ SOLN: 12.5000 mg | INTRAMUSCULAR | Status: DC | PRN

## 2011-11-09 MED ORDER — FENTANYL 2.5 MCG/ML BUPIVACAINE 1/10 % EPIDURAL INFUSION (WH - ANES)
14.0000 mL/h | INTRAMUSCULAR | Status: DC
  Administered 2011-11-09: 14 mL/h via EPIDURAL
  Filled 2011-11-09: qty 125

## 2011-11-09 MED ORDER — ONDANSETRON HCL 4 MG/2ML IJ SOLN: 4.0000 mg | Freq: Four times a day (QID) | INTRAMUSCULAR | Status: DC | PRN

## 2011-11-09 MED ORDER — ONDANSETRON HCL 4 MG PO TABS: 4.0000 mg | ORAL_TABLET | ORAL | Status: DC | PRN

## 2011-11-09 MED ORDER — NALBUPHINE SYRINGE 5 MG/0.5 ML
5.0000 mg | INJECTION | INTRAMUSCULAR | Status: DC | PRN
  Filled 2011-11-09: qty 0.5

## 2011-11-09 MED ORDER — SENNOSIDES-DOCUSATE SODIUM 8.6-50 MG PO TABS
2.0000 | ORAL_TABLET | Freq: Every day | ORAL | Status: DC
  Administered 2011-11-09: 2 via ORAL

## 2011-11-09 MED ORDER — LACTATED RINGERS IV SOLN
500.0000 mL | Freq: Once | INTRAVENOUS | Status: AC
  Administered 2011-11-09: 500 mL via INTRAVENOUS

## 2011-11-09 MED ORDER — IBUPROFEN 600 MG PO TABS: 600.0000 mg | ORAL_TABLET | Freq: Four times a day (QID) | ORAL | Status: DC | PRN

## 2011-11-09 MED ORDER — LACTATED RINGERS IV SOLN
INTRAVENOUS | Status: DC
Start: 2011-11-09 — End: 2011-11-09
  Administered 2011-11-09 (×2): via INTRAVENOUS

## 2011-11-09 MED ORDER — ZOLPIDEM TARTRATE 5 MG PO TABS: 5.0000 mg | ORAL_TABLET | Freq: Every evening | ORAL | Status: DC | PRN

## 2011-11-09 MED ORDER — ONDANSETRON HCL 4 MG/2ML IJ SOLN: 4.0000 mg | INTRAMUSCULAR | Status: DC | PRN

## 2011-11-09 MED ORDER — BENZOCAINE-MENTHOL 20-0.5 % EX AERO
1.0000 "application " | INHALATION_SPRAY | CUTANEOUS | Status: DC | PRN
  Filled 2011-11-09: qty 56

## 2011-11-09 MED ORDER — LANOLIN HYDROUS EX OINT: TOPICAL_OINTMENT | CUTANEOUS | Status: DC | PRN

## 2011-11-09 MED ORDER — DIBUCAINE 1 % RE OINT
1.0000 "application " | TOPICAL_OINTMENT | RECTAL | Status: DC | PRN
  Administered 2011-11-09: 1 via RECTAL
  Filled 2011-11-09 (×2): qty 28

## 2011-11-09 MED ORDER — LACTATED RINGERS IV SOLN: 500.0000 mL | INTRAVENOUS | Status: DC | PRN

## 2011-11-09 MED ORDER — OXYCODONE-ACETAMINOPHEN 5-325 MG PO TABS
1.0000 | ORAL_TABLET | ORAL | Status: DC | PRN
  Administered 2011-11-09: 2 via ORAL
  Administered 2011-11-09: 1 via ORAL
  Filled 2011-11-09: qty 2
  Filled 2011-11-09: qty 1

## 2011-11-09 MED ORDER — CITRIC ACID-SODIUM CITRATE 334-500 MG/5ML PO SOLN: 30.0000 mL | ORAL | Status: DC | PRN

## 2011-11-09 MED ORDER — PRENATAL MULTIVITAMIN CH
1.0000 | ORAL_TABLET | Freq: Every day | ORAL | Status: DC
  Administered 2011-11-09 – 2011-11-10 (×2): 1 via ORAL
  Filled 2011-11-09 (×2): qty 1

## 2011-11-09 MED ORDER — PHENYLEPHRINE 40 MCG/ML (10ML) SYRINGE FOR IV PUSH (FOR BLOOD PRESSURE SUPPORT): 80.0000 ug | PREFILLED_SYRINGE | INTRAVENOUS | Status: DC | PRN

## 2011-11-09 MED ORDER — LIDOCAINE HCL (PF) 1 % IJ SOLN: 30.0000 mL | INTRAMUSCULAR | Status: DC | PRN

## 2011-11-09 MED ORDER — OXYTOCIN 40 UNITS IN LACTATED RINGERS INFUSION - SIMPLE MED
62.5000 mL/h | Freq: Once | INTRAVENOUS | Status: AC
  Administered 2011-11-09: 62.5 mL/h via INTRAVENOUS
  Filled 2011-11-09: qty 1000

## 2011-11-09 MED ORDER — PHENYLEPHRINE 40 MCG/ML (10ML) SYRINGE FOR IV PUSH (FOR BLOOD PRESSURE SUPPORT)
80.0000 ug | PREFILLED_SYRINGE | INTRAVENOUS | Status: DC | PRN
  Filled 2011-11-09: qty 5

## 2011-11-09 MED ORDER — LIDOCAINE HCL (PF) 1 % IJ SOLN
INTRAMUSCULAR | Status: DC | PRN
  Administered 2011-11-09: 4 mL
  Administered 2011-11-09: 2 mL
  Administered 2011-11-09 (×2): 4 mL

## 2011-11-09 MED ORDER — EPHEDRINE 5 MG/ML INJ: 10.0000 mg | INTRAVENOUS | Status: DC | PRN

## 2011-11-09 MED ORDER — IBUPROFEN 600 MG PO TABS
600.0000 mg | ORAL_TABLET | Freq: Four times a day (QID) | ORAL | Status: DC
  Administered 2011-11-09 – 2011-11-10 (×5): 600 mg via ORAL
  Filled 2011-11-09 (×5): qty 1

## 2011-11-09 MED ORDER — SIMETHICONE 80 MG PO CHEW: 80.0000 mg | CHEWABLE_TABLET | ORAL | Status: DC | PRN

## 2011-11-09 MED ORDER — OXYCODONE-ACETAMINOPHEN 5-325 MG PO TABS: 1.0000 | ORAL_TABLET | ORAL | Status: DC | PRN

## 2011-11-09 MED ORDER — TETANUS-DIPHTH-ACELL PERTUSSIS 5-2.5-18.5 LF-MCG/0.5 IM SUSP
0.5000 mL | Freq: Once | INTRAMUSCULAR | Status: AC
Start: 2011-11-10 — End: 2011-11-10
  Administered 2011-11-10: 0.5 mL via INTRAMUSCULAR
  Filled 2011-11-09: qty 0.5

## 2011-11-09 MED ORDER — DIPHENHYDRAMINE HCL 25 MG PO CAPS: 25.0000 mg | ORAL_CAPSULE | Freq: Four times a day (QID) | ORAL | Status: DC | PRN

## 2011-11-09 MED ORDER — ACETAMINOPHEN 325 MG PO TABS: 650.0000 mg | ORAL_TABLET | ORAL | Status: DC | PRN

## 2011-11-09 NOTE — Anesthesia Preprocedure Evaluation (Signed)
Anesthesia Evaluation  Patient identified by MRN, date of birth, ID band Patient awake    Reviewed: Allergy & Precautions, H&P , NPO status , Patient's Chart, lab work & pertinent test results, reviewed documented beta blocker date and time   History of Anesthesia Complications Negative for: history of anesthetic complications  Airway Mallampati: III TM Distance: >3 FB Neck ROM: full    Dental  (+) Teeth Intact   Pulmonary shortness of breath (always - no medical evaluation), Current Smoker,  breath sounds clear to auscultation        Cardiovascular negative cardio ROS  Rhythm:regular Rate:Normal     Neuro/Psych negative neurological ROS  negative psych ROS   GI/Hepatic negative GI ROS, Neg liver ROS,   Endo/Other  negative endocrine ROS  Renal/GU negative Renal ROS     Musculoskeletal   Abdominal   Peds  Hematology  (+) anemia ,   Anesthesia Other Findings   Reproductive/Obstetrics (+) Pregnancy                           Anesthesia Physical Anesthesia Plan  ASA: II  Anesthesia Plan: Epidural   Post-op Pain Management:    Induction:   Airway Management Planned:   Additional Equipment:   Intra-op Plan:   Post-operative Plan:   Informed Consent: I have reviewed the patients History and Physical, chart, labs and discussed the procedure including the risks, benefits and alternatives for the proposed anesthesia with the patient or authorized representative who has indicated his/her understanding and acceptance.     Plan Discussed with:   Anesthesia Plan Comments:         Anesthesia Quick Evaluation

## 2011-11-09 NOTE — H&P (Signed)
Elizabeth Mendoza is a 31 y.o. female [id number] [redacted]w[redacted]d  presenting for contractions. She is a patient of [facility] and claims she follow up prenatally with them; however she did not bring any of her records with her. She reports no leaking or gush of fluids, no vaginal bleeding. She has felt her baby today. She is feeling contractions every 5 minutes. She reports no complications with this pregnancy. It has been two years since her last delivery and her largest baby was about 8 pounds.  Maternal Medical History:  Reason for admission: Reason for Admission:   nausea  OB History    Grav Para Term Preterm Abortions TAB SAB Ect Mult Living   7 3 3  0 3 3 0 0 0 3     Past Medical History  Diagnosis Date  . No pertinent past medical history    Past Surgical History  Procedure Date  . Induced abortion   . Dilation and curettage of uterus    Family History: family history is negative for Anesthesia problems. Social History:  reports that she has quit smoking. Her smoking use included Cigarettes. She does not have any smokeless tobacco history on file. She reports that she does not drink alcohol or use illicit drugs.   Prenatal Transfer Tool  Maternal Diabetes: No Genetic Screening: Declined Maternal Ultrasounds/Referrals: Declined Fetal Ultrasounds or other Referrals:  None Maternal Substance Abuse:  No Significant Maternal Medications:  None Significant Maternal Lab Results:  None Other Comments:  [facility]; with out records  Review of Systems  Constitutional: Negative for fever and chills.  Eyes: Negative for blurred vision and double vision.  Respiratory: Negative for cough, shortness of breath and wheezing.   Cardiovascular: Negative for chest pain and palpitations.  Gastrointestinal: Negative for heartburn, nausea, vomiting, diarrhea and constipation.  Genitourinary: Negative for dysuria, urgency and frequency.  Musculoskeletal: Negative.   Skin: Negative for rash.    Neurological: Negative for dizziness, tingling, weakness and headaches.     Last menstrual period 02/15/2011. Maternal Exam:  Uterine Assessment: Contraction strength is firm.  Contraction duration is 5 minutes. Contraction frequency is irregular.   Abdomen: Fetal presentation: vertex  Introitus: Vagina is negative for discharge.    Fetal Exam Fetal Monitor Review: Mode: ultrasound.   Variability: moderate (6-25 bpm).   Pattern: accelerations present and no decelerations.    Fetal State Assessment: Category I - tracings are normal.    BP 110/78  Pulse 108  Temp 98.6 F (37 C) (Oral)  Resp 16  Ht 5\' 4"  (1.626 m)  Wt 97.977 kg (216 lb)  BMI 37.08 kg/m2  SpO2 99%  LMP 02/15/2011  Physical Exam  Constitutional: She is oriented to person, place, and time. She appears well-developed and well-nourished. No distress.  HENT:  Head: Normocephalic and atraumatic.  Nose: Nose normal.  Mouth/Throat: No oropharyngeal exudate.  Eyes: Conjunctivae normal and EOM are normal. Pupils are equal, round, and reactive to light. Right eye exhibits no discharge. Left eye exhibits no discharge. No scleral icterus.  Neck: Neck supple.  Cardiovascular: Normal rate and regular rhythm.   No murmur heard. Respiratory: Effort normal and breath sounds normal. No respiratory distress. She has no wheezes. She has no rales.  GI: Bowel sounds are normal.       Gravid   Genitourinary: Vagina normal and uterus normal. No vaginal discharge found.  Musculoskeletal: She exhibits no edema and no tenderness.  Neurological: She is alert and oriented to person, place, and  time.  Skin: Skin is warm and dry. No rash noted. She is not diaphoretic.  Psychiatric: She has a normal mood and affect.   Dilation: 7 Effacement (%): 90 Station: -1 Presentation: Vertex Exam by:: Dr. Claiborne Billings Results for orders placed during the hospital encounter of 11/09/11 (from the past 24 hour(s))  CBC     Status: Abnormal    Collection Time   11/09/11  2:20 AM      Component Value Range   WBC 10.3  4.0 - 10.5 K/uL   RBC 3.08 (*) 3.87 - 5.11 MIL/uL   Hemoglobin 9.1 (*) 12.0 - 15.0 g/dL   HCT 16.1 (*) 09.6 - 04.5 %   MCV 86.7  78.0 - 100.0 fL   MCH 29.5  26.0 - 34.0 pg   MCHC 34.1  30.0 - 36.0 g/dL   RDW 40.9  81.1 - 91.4 %   Platelets 285  150 - 400 K/uL  ABO/RH     Status: Normal (Preliminary result)   Collection Time   11/09/11  2:20 AM      Component Value Range   ABO/RH(D) B POS      Prenatal labs: Other labs pending ABO, Rh:  B POSITIVE Antibody:   Rubella:   RPR:    HBsAg:    HIV:    GBS:     Assessment/Plan: 1. Active labor at term 2. Transfer to LD 3. Prenatal labs drawn; GBS PCR 4. Anticipate SVD  Felix Pacini 11/09/2011, 1:59 AM  I have seen and examined this patient and I agree with the above. Cam Hai 7:58 AM 11/09/2011

## 2011-11-09 NOTE — Anesthesia Postprocedure Evaluation (Signed)
  Anesthesia Post-op Note  Patient: Elizabeth Mendoza  Procedure(s) Performed: * No procedures listed *  Patient Location: Mother/Baby  Anesthesia Type: Epidural  Level of Consciousness: awake  Airway and Oxygen Therapy: Patient Spontanous Breathing  Post-op Pain: mild  Post-op Assessment: Patient's Cardiovascular Status Stable and Respiratory Function Stable  Post-op Vital Signs: stable  Complications: No apparent anesthesia complications

## 2011-11-09 NOTE — MAU Note (Signed)
contractions 

## 2011-11-09 NOTE — Anesthesia Procedure Notes (Signed)
Epidural Patient location during procedure: OB Start time: 11/09/2011 2:56 AM  Staffing Performed by: anesthesiologist   Preanesthetic Checklist Completed: patient identified, site marked, surgical consent, pre-op evaluation, timeout performed, IV checked, risks and benefits discussed and monitors and equipment checked  Epidural Patient position: sitting Prep: site prepped and draped and DuraPrep Patient monitoring: continuous pulse ox and blood pressure Approach: midline Injection technique: LOR air  Needle:  Needle type: Tuohy  Needle gauge: 17 G Needle length: 9 cm and 9 Needle insertion depth: 6 cm Catheter type: closed end flexible Catheter size: 19 Gauge Catheter at skin depth: 11 cm Test dose: negative  Assessment Events: blood not aspirated, injection not painful, no injection resistance, negative IV test and no paresthesia  Additional Notes Discussed risk of headache, infection, bleeding, nerve injury and failed or incomplete block.  Patient voices understanding and wishes to proceed. Reason for block:procedure for pain

## 2011-11-10 ENCOUNTER — Ambulatory Visit (INDEPENDENT_AMBULATORY_CARE_PROVIDER_SITE_OTHER): Payer: Medicaid Other | Admitting: Medical

## 2011-11-10 VITALS — BP 127/84 | HR 107 | Resp 12

## 2011-11-10 DIAGNOSIS — Z309 Encounter for contraceptive management, unspecified: Secondary | ICD-10-CM

## 2011-11-10 DIAGNOSIS — Z3049 Encounter for surveillance of other contraceptives: Secondary | ICD-10-CM

## 2011-11-10 MED ORDER — IBUPROFEN 600 MG PO TABS: 600.0000 mg | ORAL_TABLET | Freq: Four times a day (QID) | ORAL | Status: DC

## 2011-11-10 MED ORDER — MEDROXYPROGESTERONE ACETATE 150 MG/ML IM SUSP
150.0000 mg | INTRAMUSCULAR | Status: DC
  Administered 2011-11-10: 150 mg via INTRAMUSCULAR

## 2011-11-10 MED ORDER — PNEUMOCOCCAL VAC POLYVALENT 25 MCG/0.5ML IJ INJ
0.5000 mL | INJECTION | INTRAMUSCULAR | Status: AC
  Administered 2011-11-10: 0.5 mL via INTRAMUSCULAR
  Filled 2011-11-10: qty 0.5

## 2011-11-10 NOTE — Progress Notes (Signed)
UR chart review completed.  

## 2011-11-10 NOTE — Discharge Summary (Signed)
Obstetric Discharge Summary Reason for Admission: onset of labor Prenatal Procedures: none, epidural Intrapartum Procedures: spontaneous vaginal delivery Postpartum Procedures: none Complications-Operative and Postpartum: none  Pt is a 31 y.o. [id number] who presented with spontaneous onset of labor at [redacted]w[redacted]d.  She went on to have a normal SVD on 11/09/11 with no complications.   Subjective: Patient reports that she is doing well. She is still having some abdominal cramping. She is ambulating, eating/drinking, voiding, +flatus, no BM. She has had no CP, SOB, LE edema.  Baby is being bottle fed with formula. She plans to have the circumcision as OP when she has enough money. She is planning to get Depo-provera for contraception.     Hemoglobin  Date Value Range Status  11/09/2011 9.1* 12.0 - 15.0 g/dL Final     HCT  Date Value Range Status  11/09/2011 26.7* 36.0 - 46.0 % Final    Physical Exam:  General: alert, cooperative and no distress Cardio: normal rate Pulm: normal rate, no distress Lochia: appropriate Uterine Fundus: firm DVT Evaluation: No evidence of DVT seen on physical exam. Negative Homan's sign. No cords or calf tenderness. No significant calf/ankle edema.  Discharge Diagnoses: Term Pregnancy-delivered  Discharge Information: Date: 11/10/2011 Activity: pelvic rest Diet: routine Medications: Ibuprofen and Colace Condition: stable Instructions: refer to practice specific booklet Discharge to: home Contraception: Depo-Provera Circumcision: To be done as OP   Newborn Data: Live born female  Birth Weight: 8 lb 6.9 oz (3825 g) APGAR: 9, 9  Home with mother.  Winfield Cunas 11/10/2011, 7:35 AM  I saw and examined patient and agree with above. She is to follow up at [facility] in 6 weeks for postpartum check. She was planning to go to [facility] on day of discharge for depoProvera shot.  Napoleon Form, MD

## 2011-11-13 LAB — TYPE AND SCREEN
ABO/RH(D): B POS
Antibody Screen: NEGATIVE
Unit division: 0

## 2011-11-15 ENCOUNTER — Encounter (HOSPITAL_BASED_OUTPATIENT_CLINIC_OR_DEPARTMENT_OTHER): Payer: Self-pay | Admitting: Family Medicine

## 2011-11-15 ENCOUNTER — Emergency Department (HOSPITAL_BASED_OUTPATIENT_CLINIC_OR_DEPARTMENT_OTHER)
Admission: EM | Admit: 2011-11-15 | Discharge: 2011-11-15 | Disposition: A | Discharge status: Home / Self Care | Payer: Medicaid Other | Attending: Emergency Medicine | Admitting: Emergency Medicine

## 2011-11-15 DIAGNOSIS — L02419 Cutaneous abscess of limb, unspecified: Secondary | ICD-10-CM

## 2011-11-15 DIAGNOSIS — Z87891 Personal history of nicotine dependence: Secondary | ICD-10-CM | POA: Insufficient documentation

## 2011-11-15 DIAGNOSIS — IMO0002 Reserved for concepts with insufficient information to code with codable children: Secondary | ICD-10-CM | POA: Insufficient documentation

## 2011-11-15 MED ORDER — OXYCODONE-ACETAMINOPHEN 5-325 MG PO TABS: 1.0000 | ORAL_TABLET | ORAL | Status: DC | PRN

## 2011-11-15 MED ORDER — DOXYCYCLINE HYCLATE 100 MG PO CAPS: 100.0000 mg | ORAL_CAPSULE | Freq: Two times a day (BID) | ORAL | Status: DC

## 2011-11-15 NOTE — ED Notes (Signed)
Pt c/o "knot under left arm" x 10 days. Pt denies drainage, fever.

## 2011-11-15 NOTE — ED Provider Notes (Signed)
History     CSN: [id number]  Arrival date & time 11/15/11  8413   First MD Initiated Contact with Patient 11/15/11 [phone]      Chief Complaint  Patient presents with  . Mass     Patient is a 31 y.o. female presenting with abscess. The history is provided by the patient.  Abscess  This is a recurrent problem. The current episode started more than one week ago. The onset was gradual. The problem occurs continuously. The problem has been gradually worsening. Affected Location: left axilla. The abscess first occurred at home. Pertinent negatives include no fever.  pt reports "mass" to left axilla for past 10 days.  No trauma.  No cp/sob.  No fever.  Reports h/o this in past and required drainage.  She is postpartum (delivered on 10/9) No complications.  She is not breastfeeding.  She had symptoms prior to delivery  Past Medical History  Diagnosis Date  . No pertinent past medical history   . Shortness of breath     Past Surgical History  Procedure Date  . Induced abortion   . Dilation and curettage of uterus     Family History  Problem Relation Age of Onset  . Anesthesia problems Neg Hx     History  Substance Use Topics  . Smoking status: Former Smoker    Types: Cigarettes  . Smokeless tobacco: Not on file  . Alcohol Use: No    OB History    Grav Para Term Preterm Abortions TAB SAB Ect Mult Living   7 4 4  0 3 3 0 0 0 4      Review of Systems  Constitutional: Negative for fever.  Neurological: Negative for weakness.    Allergies  Review of patient's allergies indicates no known allergies.  Home Medications   Current Outpatient Rx  Name Route Sig Dispense Refill  . DOXYCYCLINE HYCLATE 100 MG PO CAPS Oral Take 1 capsule (100 mg total) by mouth 2 (two) times daily. 14 capsule 0  . HYDROCORTISONE 0.5 % EX CREA Topical Apply 1 application topically daily. For eczema.    . IBUPROFEN 600 MG PO TABS Oral Take 1 tablet (600 mg total) by mouth every 6 (six) hours.  30 tablet 1  . OXYCODONE-ACETAMINOPHEN 5-325 MG PO TABS Oral Take 1 tablet by mouth every 4 (four) hours as needed for pain. 5 tablet 0  . PRENATAL MULTIVITAMIN CH Oral Take 1 tablet by mouth daily.      BP 128/85  Pulse 90  Temp 98.4 F (36.9 C) (Oral)  Resp 20  Ht 5\' 4"  (1.626 m)  Wt 216 lb (97.977 kg)  BMI 37.08 kg/m2  SpO2 99%  Breastfeeding? No  Physical Exam CONSTITUTIONAL: Well developed/well nourished HEAD AND FACE: Normocephalic/atraumatic EYES: EOMI ENMT: Mucous membranes moist NECK: supple no meningeal signs CV: S1/S2 noted, no murmurs/rubs/gallops noted LUNGS: Lungs are clear to auscultation bilaterally, no apparent distress ABDOMEN: soft, nontender, no rebound or guarding GU:no cva tenderness NEURO: Pt is awake/alert, moves all extremitiesx4 EXTREMITIES: pulses normal, full ROM.  Induration and mild erythema noted to left axilla.  No crepitance/drainage/fluctuance.  No masses noted.  Chaperone present SKIN: warm, color normal PSYCH: no abnormalities of mood noted  ED Course  Procedures   1. Axillary abscess    Pt with likely early abscess not amenable to drainage, will defer drainage, start abx, and referred to surgery as she has had this previously.  She is well appearing/nontoxic and stable for d/c  MDM  Nursing notes including past medical history and social history reviewed and considered in documentation Previous records reviewed and considered         Joya Gaskins, MD 11/15/11 1012

## 2011-12-31 ENCOUNTER — Encounter (HOSPITAL_COMMUNITY): Payer: Self-pay | Admitting: Emergency Medicine

## 2011-12-31 ENCOUNTER — Emergency Department (HOSPITAL_COMMUNITY)
Admission: EM | Admit: 2011-12-31 | Discharge: 2011-12-31 | Disposition: A | Discharge status: Home / Self Care | Payer: Medicaid Other | Attending: Emergency Medicine | Admitting: Emergency Medicine

## 2011-12-31 DIAGNOSIS — Z8709 Personal history of other diseases of the respiratory system: Secondary | ICD-10-CM | POA: Insufficient documentation

## 2011-12-31 DIAGNOSIS — Z8711 Personal history of peptic ulcer disease: Secondary | ICD-10-CM | POA: Insufficient documentation

## 2011-12-31 DIAGNOSIS — R109 Unspecified abdominal pain: Secondary | ICD-10-CM | POA: Insufficient documentation

## 2011-12-31 DIAGNOSIS — Z87891 Personal history of nicotine dependence: Secondary | ICD-10-CM | POA: Insufficient documentation

## 2011-12-31 DIAGNOSIS — Z3202 Encounter for pregnancy test, result negative: Secondary | ICD-10-CM | POA: Insufficient documentation

## 2011-12-31 HISTORY — DX: Reserved for concepts with insufficient information to code with codable children: IMO0002

## 2011-12-31 LAB — CBC WITH DIFFERENTIAL/PLATELET
Basophils Absolute: 0 10*3/uL (ref 0.0–0.1)
Basophils Relative: 0 % (ref 0–1)
Eosinophils Absolute: 0.1 10*3/uL (ref 0.0–0.7)
Eosinophils Relative: 1 % (ref 0–5)
Lymphs Abs: 2.7 10*3/uL (ref 0.7–4.0)
MCH: 28.7 pg (ref 26.0–34.0)
MCHC: 34.1 g/dL (ref 30.0–36.0)
MCV: 84.3 fL (ref 78.0–100.0)
Neutrophils Relative %: 59 % (ref 43–77)
Platelets: 315 10*3/uL (ref 150–400)
RDW: 14.3 % (ref 11.5–15.5)

## 2011-12-31 LAB — COMPREHENSIVE METABOLIC PANEL
ALT: 15 U/L (ref 0–35)
Alkaline Phosphatase: 83 U/L (ref 39–117)
BUN: 11 mg/dL (ref 6–23)
CO2: 24 mEq/L (ref 19–32)
Calcium: 9.3 mg/dL (ref 8.4–10.5)
GFR calc Af Amer: >90 mL/min (ref >90)
GFR calc non Af Amer: >90 mL/min (ref >90)
Glucose, Bld: 99 mg/dL (ref 70–99)
Potassium: 3.8 mEq/L (ref 3.5–5.1)
Sodium: 137 mEq/L (ref 135–145)
Total Protein: 7.2 g/dL (ref 6.0–8.3)

## 2011-12-31 LAB — POCT PREGNANCY, URINE: Preg Test, Ur: NEGATIVE

## 2011-12-31 LAB — URINALYSIS, ROUTINE W REFLEX MICROSCOPIC
Bilirubin Urine: NEGATIVE
Ketones, ur: NEGATIVE mg/dL
Nitrite: NEGATIVE
Protein, ur: NEGATIVE mg/dL
Urobilinogen, UA: 1 mg/dL (ref 0.0–1.0)

## 2011-12-31 LAB — LIPASE, BLOOD: Lipase: 34 U/L (ref 11–59)

## 2011-12-31 MED ORDER — OMEPRAZOLE 40 MG PO CPDR: 40.0000 mg | DELAYED_RELEASE_CAPSULE | Freq: Every day | ORAL | Status: DC

## 2011-12-31 NOTE — ED Provider Notes (Signed)
History    CSN: [id number] Arrival date & time 12/31/11  7846 First MD Initiated Contact with Patient 12/31/11 [phone]    Chief Complaint  Patient presents with  . Abdominal Pain    HPI The patient presents emergent with complaints of a burning type abdominal pain ongoing for the last 2 weeks. She feels like it is a burning sensation around her umbilical area and in her upper abdomen. It is associated with eating certain foods. She has not had any trouble with nausea or vomiting. Her appetite has been unaffected. The patient does have a history of a recent delivery in the early part of October. She has not noted any significant complications since that time but has not followed up with her OB doctor because her skin is very busy. She has noticed over the last few days that she started having a small amount of vaginal spotting.  She did get the double shot after her pregnancy. She has never had that medication before.  She denies any chest pain or shortness of breath. She's not having any vaginal discharge otherwise. She denies any lower abdominal tenderness. Patient states her symptoms feels similar to when she had ulcer type problems before. Past Medical History  Diagnosis Date  . No pertinent past medical history   . Shortness of breath   . Ulcer     Past Surgical History  Procedure Date  . Induced abortion   . Dilation and curettage of uterus     Family History  Problem Relation Age of Onset  . Anesthesia problems Neg Hx     History  Substance Use Topics  . Smoking status: Former Smoker    Types: Cigarettes  . Smokeless tobacco: Not on file  . Alcohol Use: Yes    OB History    Grav Para Term Preterm Abortions TAB SAB Ect Mult Living   7 4 4  0 3 3 0 0 0 4      Review of Systems  All other systems reviewed and are negative.    Allergies  Review of patient's allergies indicates no known allergies.  Home Medications   Current Outpatient Rx  Name  Route  Sig   Dispense  Refill  . DOXYCYCLINE HYCLATE 100 MG PO CAPS   Oral   Take 1 capsule (100 mg total) by mouth 2 (two) times daily.   14 capsule   0   . HYDROCORTISONE 0.5 % EX CREA   Topical   Apply 1 application topically daily. For eczema.         . IBUPROFEN 600 MG PO TABS   Oral   Take 1 tablet (600 mg total) by mouth every 6 (six) hours.   30 tablet   1   . OXYCODONE-ACETAMINOPHEN 5-325 MG PO TABS   Oral   Take 1 tablet by mouth every 4 (four) hours as needed for pain.   5 tablet   0   . PRENATAL MULTIVITAMIN CH   Oral   Take 1 tablet by mouth daily.           BP 128/85  Pulse 90  Temp 97.5 F (36.4 C) (Oral)  Resp 18  LMP 12/08/2011  Breastfeeding? No  Physical Exam  Nursing note and vitals reviewed. Constitutional: She appears well-developed and well-nourished. No distress.  HENT:  Head: Normocephalic and atraumatic.  Right Ear: External ear normal.  Left Ear: External ear normal.  Eyes: Conjunctivae normal are normal. Right eye exhibits no discharge. Left  eye exhibits no discharge. No scleral icterus.  Neck: Neck supple. No tracheal deviation present.  Cardiovascular: Normal rate, regular rhythm and intact distal pulses.   Pulmonary/Chest: Effort normal and breath sounds normal. No stridor. No respiratory distress. She has no wheezes. She has no rales.  Abdominal: Soft. Bowel sounds are normal. She exhibits no distension. There is no tenderness. There is no rebound and no guarding.  Musculoskeletal: She exhibits no edema and no tenderness.  Neurological: She is alert. She has normal strength. No sensory deficit. Cranial nerve deficit:  no gross defecits noted. She exhibits normal muscle tone. She displays no seizure activity. Coordination normal.  Skin: Skin is warm and dry. No rash noted.  Psychiatric: She has a normal mood and affect.    ED Course  Procedures (including critical care time)  Labs Reviewed  COMPREHENSIVE METABOLIC PANEL - Abnormal;  Notable for the following:    Albumin 3.4 (*)     All other components within normal limits  URINALYSIS, ROUTINE W REFLEX MICROSCOPIC - Abnormal; Notable for the following:    APPearance CLOUDY (*)     Hgb urine dipstick LARGE (*)     All other components within normal limits  CBC WITH DIFFERENTIAL - Abnormal; Notable for the following:    RBC 3.62 (*)     Hemoglobin 10.4 (*)     HCT 30.5 (*)     All other components within normal limits  LIPASE, BLOOD  POCT PREGNANCY, URINE  URINE MICROSCOPIC-ADD ON   No results found.   1. Abdominal pain       MDM  Abdominal pain Symptoms suggest possible GERD, gastritis.  She has noticed a burning sensation.  Doubt biliary colic, pancreatitis, lower abdominal etiology.  Will try oral antacids.  Vaginal bleeding Pt has had some mild spotting on depo.  Explained to her this is to be expected with this medication.  Encouraged her to follow up with her Gynecologist to be rechecked.  Doubt UTI.  Clean catch specimen was obtained        Celene Kras, MD 12/31/11 1116

## 2011-12-31 NOTE — ED Notes (Signed)
Pt states that she has been having abdominal pain times 2 weeks and c/o diarrhea times 1 week. Pt states she had a baby in October  Got the depo shot and had vaginal bleeding from beginning of Nov through Nov 27 that started off spotting then got heavy.

## 2012-01-30 ENCOUNTER — Ambulatory Visit: Payer: Medicaid Other

## 2012-02-17 ENCOUNTER — Encounter (HOSPITAL_COMMUNITY): Payer: Self-pay | Admitting: Emergency Medicine

## 2012-02-17 ENCOUNTER — Emergency Department (HOSPITAL_COMMUNITY)
Admission: EM | Admit: 2012-02-17 | Discharge: 2012-02-17 | Disposition: A | Discharge status: Home / Self Care | Payer: Medicaid Other | Attending: Emergency Medicine | Admitting: Emergency Medicine

## 2012-02-17 ENCOUNTER — Emergency Department (HOSPITAL_COMMUNITY): Payer: Medicaid Other

## 2012-02-17 DIAGNOSIS — Z3202 Encounter for pregnancy test, result negative: Secondary | ICD-10-CM | POA: Insufficient documentation

## 2012-02-17 DIAGNOSIS — R109 Unspecified abdominal pain: Secondary | ICD-10-CM

## 2012-02-17 DIAGNOSIS — R110 Nausea: Secondary | ICD-10-CM | POA: Insufficient documentation

## 2012-02-17 DIAGNOSIS — Z87891 Personal history of nicotine dependence: Secondary | ICD-10-CM | POA: Insufficient documentation

## 2012-02-17 DIAGNOSIS — R1013 Epigastric pain: Secondary | ICD-10-CM | POA: Insufficient documentation

## 2012-02-17 DIAGNOSIS — Z79899 Other long term (current) drug therapy: Secondary | ICD-10-CM | POA: Insufficient documentation

## 2012-02-17 DIAGNOSIS — R238 Other skin changes: Secondary | ICD-10-CM | POA: Insufficient documentation

## 2012-02-17 DIAGNOSIS — Z8719 Personal history of other diseases of the digestive system: Secondary | ICD-10-CM | POA: Insufficient documentation

## 2012-02-17 LAB — URINALYSIS, ROUTINE W REFLEX MICROSCOPIC
Glucose, UA: NEGATIVE mg/dL
Hgb urine dipstick: NEGATIVE
Ketones, ur: NEGATIVE mg/dL
Leukocytes, UA: NEGATIVE
pH: 6 (ref 5.0–8.0)

## 2012-02-17 LAB — CBC
MCH: 28.9 pg (ref 26.0–34.0)
MCHC: 33.6 g/dL (ref 30.0–36.0)
Platelets: 344 10*3/uL (ref 150–400)
RBC: 3.57 MIL/uL — ABNORMAL LOW (ref 3.87–5.11)

## 2012-02-17 LAB — COMPREHENSIVE METABOLIC PANEL
ALT: 15 U/L (ref 0–35)
AST: 13 U/L (ref 0–37)
CO2: 27 mEq/L (ref 19–32)
Chloride: 103 mEq/L (ref 96–112)
Creatinine, Ser: 0.65 mg/dL (ref 0.50–1.10)
GFR calc non Af Amer: >90 mL/min (ref >90)
Glucose, Bld: 89 mg/dL (ref 70–99)
Total Bilirubin: 0.3 mg/dL (ref 0.3–1.2)

## 2012-02-17 LAB — LIPASE, BLOOD: Lipase: 21 U/L (ref 11–59)

## 2012-02-17 MED ORDER — DOXYCYCLINE HYCLATE 100 MG PO CAPS: 100.0000 mg | ORAL_CAPSULE | Freq: Two times a day (BID) | ORAL | Status: DC

## 2012-02-17 MED ORDER — PANTOPRAZOLE SODIUM 40 MG PO TBEC: 40.0000 mg | DELAYED_RELEASE_TABLET | Freq: Every day | ORAL | Status: DC

## 2012-02-17 MED ORDER — FAMOTIDINE 20 MG PO TABS
20.0000 mg | ORAL_TABLET | Freq: Once | ORAL | Status: AC
  Administered 2012-02-17: 20 mg via ORAL
  Filled 2012-02-17: qty 1

## 2012-02-17 MED ORDER — GI COCKTAIL ~~LOC~~
30.0000 mL | Freq: Once | ORAL | Status: AC
  Administered 2012-02-17: 30 mL via ORAL
  Filled 2012-02-17: qty 30

## 2012-02-17 NOTE — ED Notes (Signed)
Pt complains of abdominal pain x 1 month with nausea at this time.

## 2012-02-17 NOTE — ED Notes (Signed)
MD at bedside. 

## 2012-02-17 NOTE — ED Notes (Signed)
[place] in room, test complete, pt reports that she has to leave, informed pt that it would be AMA. Pt also asked to speak with Dr. Denton Lank whom was made aware of pt's request.

## 2012-02-17 NOTE — ED Provider Notes (Addendum)
History     CSN: [id number]  Arrival date & time 02/17/12  1010   First MD Initiated Contact with Patient 02/17/12 1031      Chief Complaint  Patient presents with  . Abdominal Pain    (Consider location/radiation/quality/duration/timing/severity/associated sxs/prior treatment) Patient is a 32 y.o. female presenting with abdominal pain. The history is provided by the patient.  Abdominal Pain The primary symptoms of the illness include abdominal pain. The primary symptoms of the illness do not include fever, shortness of breath, vomiting or diarrhea.  Symptoms associated with the illness do not include chills, constipation or back pain.  pt c/o epigastric pain intermittently for past couple months. Occasionally worse w eating, no other exacerbating or alleviating factors. No fever or chills. No nv. No abd distension or prior surgery. Having normal bms. No acute or abrupt change in pain today. Denies hx pud, gallstones or pancreatitis. No cp or sob. No back or flank pain. No gu c/o. No mid to lower abd or pelvic pain.     Past Medical History  Diagnosis Date  . No pertinent past medical history   . Shortness of breath   . Ulcer     Past Surgical History  Procedure Date  . Induced abortion   . Dilation and curettage of uterus     Family History  Problem Relation Age of Onset  . Anesthesia problems Neg Hx     History  Substance Use Topics  . Smoking status: Former Smoker    Types: Cigarettes  . Smokeless tobacco: Not on file  . Alcohol Use: Yes    OB History    Grav Para Term Preterm Abortions TAB SAB Ect Mult Living   7 4 4  0 3 3 0 0 0 4      Review of Systems  Constitutional: Negative for fever and chills.  HENT: Negative for neck pain.   Eyes: Negative for redness.  Respiratory: Negative for shortness of breath.   Cardiovascular: Negative for chest pain.  Gastrointestinal: Positive for abdominal pain. Negative for vomiting, diarrhea and constipation.    Genitourinary: Negative for flank pain and pelvic pain.  Musculoskeletal: Negative for back pain.       Pt notes sore areas under bil axilla. Hx boils?hidradenitis.  No single/discrete abscess.  Skin: Negative for rash.  Neurological: Negative for headaches.  Hematological: Does not bruise/bleed easily.  Psychiatric/Behavioral: Negative for confusion.    Allergies  Review of patient's allergies indicates no known allergies.  Home Medications   Current Outpatient Rx  Name  Route  Sig  Dispense  Refill  . MEDROXYPROGESTERONE ACETATE 150 MG/ML IM SUSP   Intramuscular   Inject 150 mg into the muscle every 3 (three) months.         . OMEPRAZOLE 40 MG PO CPDR   Oral   Take 1 capsule (40 mg total) by mouth daily.   14 capsule   1     BP 128/85  Pulse 83  Temp 97.6 F (36.4 C) (Oral)  Resp 18  SpO2 100%  LMP 12/11/2011  Breastfeeding? No  Physical Exam  Nursing note and vitals reviewed. Constitutional: She appears well-developed and well-nourished. No distress.  Eyes: Conjunctivae normal are normal. No scleral icterus.  Neck: Neck supple. No tracheal deviation present.  Cardiovascular: Normal rate, regular rhythm, normal heart sounds and intact distal pulses.   Pulmonary/Chest: Effort normal and breath sounds normal. No respiratory distress.  Abdominal: Soft. Normal appearance and bowel sounds are normal.  She exhibits no distension and no mass. There is tenderness. There is no rebound and no guarding.  Genitourinary:       No cva tenderness  Musculoskeletal: She exhibits no edema and no tenderness.  Neurological: She is alert.  Skin: Skin is warm and dry. No rash noted.       Induration/tenderness to skin bil axilla. No discrete abscess/fluctuance noted. ?early infection.   Psychiatric: She has a normal mood and affect.    ED Course  Procedures (including critical care time)   Results for orders placed during the hospital encounter of 02/17/12  URINALYSIS,  ROUTINE W REFLEX MICROSCOPIC      Component Value Range   Color, Urine YELLOW  YELLOW   APPearance CLEAR  CLEAR   Specific Gravity, Urine 1.031 (*) 1.005 - 1.030   pH 6.0  5.0 - 8.0   Glucose, UA NEGATIVE  NEGATIVE mg/dL   Hgb urine dipstick NEGATIVE  NEGATIVE   Bilirubin Urine NEGATIVE  NEGATIVE   Ketones, ur NEGATIVE  NEGATIVE mg/dL   Protein, ur NEGATIVE  NEGATIVE mg/dL   Urobilinogen, UA 1.0  0.0 - 1.0 mg/dL   Nitrite NEGATIVE  NEGATIVE   Leukocytes, UA NEGATIVE  NEGATIVE  COMPREHENSIVE METABOLIC PANEL      Component Value Range   Sodium 140  135 - 145 mEq/L   Potassium 4.1  3.5 - 5.1 mEq/L   Chloride 103  96 - 112 mEq/L   CO2 27  19 - 32 mEq/L   Glucose, Bld 89  70 - 99 mg/dL   BUN 13  6 - 23 mg/dL   Creatinine, Ser 2.13  0.50 - 1.10 mg/dL   Calcium 9.4  8.4 - 08.6 mg/dL   Total Protein 7.3  6.0 - 8.3 g/dL   Albumin 3.5  3.5 - 5.2 g/dL   AST 13  0 - 37 U/L   ALT 15  0 - 35 U/L   Alkaline Phosphatase 87  39 - 117 U/L   Total Bilirubin 0.3  0.3 - 1.2 mg/dL   GFR calc non Af Amer >90  >90 mL/min   GFR calc Af Amer >90  >90 mL/min  CBC      Component Value Range   WBC 7.1  4.0 - 10.5 K/uL   RBC 3.57 (*) 3.87 - 5.11 MIL/uL   Hemoglobin 10.3 (*) 12.0 - 15.0 g/dL   HCT 57.8 (*) 46.9 - 62.9 %   MCV 86.0  78.0 - 100.0 fL   MCH 28.9  26.0 - 34.0 pg   MCHC 33.6  30.0 - 36.0 g/dL   RDW 52.8  41.3 - 24.4 %   Platelets 344  150 - 400 K/uL  LIPASE, BLOOD      Component Value Range   Lipase 21  11 - 59 U/L  POCT PREGNANCY, URINE      Component Value Range   Preg Test, Ur NEGATIVE  NEGATIVE   US Abdomen Complete  02/17/2012  *RADIOLOGY REPORT*  Clinical Data:  Pain.  Question gallstones.  COMPLETE ABDOMINAL ULTRASOUND  Comparison:  None.  Findings:  Gallbladder:  No gallstones, gallbladder wall thickening, or pericholecystic fluid.  Common bile duct:   Normal caliber, 3 mm.  Liver:  No focal lesion identified.  Within normal limits in parenchymal echogenicity.  IVC:  Appears  normal.  Pancreas:  No focal abnormality seen.  Spleen:  Within normal limits in size and echotexture.  Right Kidney:   Normal in size and  parenchymal echogenicity.  No evidence of mass or hydronephrosis.  Left Kidney:  Normal in size and parenchymal echogenicity.  No evidence of mass or hydronephrosis.  Abdominal aorta:  No aneurysm identified.  IMPRESSION: Negative abdominal ultrasound.   Original Report Authenticated By: Charlett Nose, M.D.      MDM  Labs. Ultrasound.  Reviewed nursing notes and prior charts for additional history.   Recheck pt comfortable. abd soft nt.  Pt stable for d/c. Will rx protonix.    Pt requests rx for axilla pain ?early infection rx doxy       Suzi Roots, MD 02/17/12 1304  Suzi Roots, MD 02/17/12 [phone]

## 2012-04-27 ENCOUNTER — Encounter (HOSPITAL_COMMUNITY): Payer: Self-pay | Admitting: Emergency Medicine

## 2012-04-27 ENCOUNTER — Emergency Department (HOSPITAL_COMMUNITY): Payer: Medicaid Other

## 2012-04-27 ENCOUNTER — Emergency Department (HOSPITAL_COMMUNITY)
Admission: EM | Admit: 2012-04-27 | Discharge: 2012-04-27 | Disposition: A | Discharge status: Home / Self Care | Payer: Medicaid Other | Attending: Emergency Medicine | Admitting: Emergency Medicine

## 2012-04-27 DIAGNOSIS — R5381 Other malaise: Secondary | ICD-10-CM | POA: Insufficient documentation

## 2012-04-27 DIAGNOSIS — G479 Sleep disorder, unspecified: Secondary | ICD-10-CM | POA: Insufficient documentation

## 2012-04-27 DIAGNOSIS — R0789 Other chest pain: Secondary | ICD-10-CM

## 2012-04-27 DIAGNOSIS — R5383 Other fatigue: Secondary | ICD-10-CM

## 2012-04-27 DIAGNOSIS — Z872 Personal history of diseases of the skin and subcutaneous tissue: Secondary | ICD-10-CM | POA: Insufficient documentation

## 2012-04-27 DIAGNOSIS — R071 Chest pain on breathing: Secondary | ICD-10-CM | POA: Insufficient documentation

## 2012-04-27 DIAGNOSIS — Z3202 Encounter for pregnancy test, result negative: Secondary | ICD-10-CM | POA: Insufficient documentation

## 2012-04-27 DIAGNOSIS — Z8709 Personal history of other diseases of the respiratory system: Secondary | ICD-10-CM | POA: Insufficient documentation

## 2012-04-27 DIAGNOSIS — E669 Obesity, unspecified: Secondary | ICD-10-CM | POA: Insufficient documentation

## 2012-04-27 DIAGNOSIS — Z87891 Personal history of nicotine dependence: Secondary | ICD-10-CM | POA: Insufficient documentation

## 2012-04-27 LAB — LIPASE, BLOOD: Lipase: 28 U/L (ref 11–59)

## 2012-04-27 LAB — CBC
HCT: 31.9 % — ABNORMAL LOW (ref 36.0–46.0)
Hemoglobin: 10.8 g/dL — ABNORMAL LOW (ref 12.0–15.0)
MCH: 28.6 pg (ref 26.0–34.0)
MCHC: 33.9 g/dL (ref 30.0–36.0)
MCV: 84.6 fL (ref 78.0–100.0)
Platelets: 361 K/uL (ref 150–400)
RBC: 3.77 MIL/uL — ABNORMAL LOW (ref 3.87–5.11)
RDW: 13.9 % (ref 11.5–15.5)
WBC: 6.5 10*3/uL (ref 4.0–10.5)

## 2012-04-27 LAB — BASIC METABOLIC PANEL WITH GFR
BUN: 14 mg/dL (ref 6–23)
CO2: 28 meq/L (ref 19–32)
Calcium: 9.2 mg/dL (ref 8.4–10.5)
Chloride: 102 meq/L (ref 96–112)
Creatinine, Ser: 0.54 mg/dL (ref 0.50–1.10)

## 2012-04-27 LAB — BASIC METABOLIC PANEL
GFR calc Af Amer: >90 mL/min (ref >90)
GFR calc non Af Amer: >90 mL/min (ref >90)
Glucose, Bld: 94 mg/dL (ref 70–99)
Potassium: 3.9 mEq/L (ref 3.5–5.1)
Sodium: 138 mEq/L (ref 135–145)

## 2012-04-27 LAB — URINALYSIS, ROUTINE W REFLEX MICROSCOPIC
Leukocytes, UA: NEGATIVE
Protein, ur: NEGATIVE mg/dL
Urobilinogen, UA: 1 mg/dL (ref 0.0–1.0)

## 2012-04-27 LAB — POCT I-STAT TROPONIN I: Troponin i, poc: 0 ng/mL (ref 0.00–0.08)

## 2012-04-27 LAB — POCT PREGNANCY, URINE: Preg Test, Ur: NEGATIVE

## 2012-04-27 MED ORDER — KETOROLAC TROMETHAMINE 30 MG/ML IJ SOLN
30.0000 mg | Freq: Once | INTRAMUSCULAR | Status: AC
  Administered 2012-04-27: 30 mg via INTRAVENOUS
  Filled 2012-04-27: qty 1

## 2012-04-27 MED ORDER — NAPROXEN 500 MG PO TABS: 500.0000 mg | ORAL_TABLET | Freq: Two times a day (BID) | ORAL | Status: DC

## 2012-04-27 MED ORDER — SODIUM CHLORIDE 0.9 % IV BOLUS (SEPSIS)
1000.0000 mL | Freq: Once | INTRAVENOUS | Status: AC
  Administered 2012-04-27: 1000 mL via INTRAVENOUS

## 2012-04-27 NOTE — ED Notes (Signed)
Per patient, feels like her body is sick-feels like she has an infection-states she has had a cold-has abcess under left arm-states epigastric pain-like gas in chest

## 2012-04-27 NOTE — ED Provider Notes (Signed)
History     CSN: [id number]  Arrival date & time 04/27/12  1052   First MD Initiated Contact with Patient 04/27/12 1247      Chief Complaint  Patient presents with  . Chest Pain    (Consider location/radiation/quality/duration/timing/severity/associated sxs/prior treatment) HPI Comments: Elizabeth Mendoza is a 32 y.o. female with no significant past medical history presents emergency department complaining of "generalized sickness."  Patient reports that she just recently recovered from an upper respiratory type infection and since recovery has been feeling fatigued.  In addition patient states that she's been having difficulty sleeping at night and her spouse reports snoring.  Lastly patient states that she's had chest tightness ever sense for cold resolved but denies any significant shortness of breath, chest pain radiation, pleurisy, leg swelling, cough, hemoptysis, fever, night sweats, chills.  No other complaints this time.  The history is provided by the patient.    Past Medical History  Diagnosis Date  . No pertinent past medical history   . Shortness of breath   . Ulcer     Past Surgical History  Procedure Laterality Date  . Induced abortion    . Dilation and curettage of uterus      Family History  Problem Relation Age of Onset  . Anesthesia problems Neg Hx     History  Substance Use Topics  . Smoking status: Former Smoker    Types: Cigarettes  . Smokeless tobacco: Not on file  . Alcohol Use: Yes    OB History   Grav Para Term Preterm Abortions TAB SAB Ect Mult Living   7 4 4  0 3 3 0 0 0 4      Review of Systems  All other systems reviewed and are negative.    Allergies  Review of patient's allergies indicates no known allergies.  Home Medications  No current outpatient prescriptions on file.  BP 113/82  Pulse 93  Temp(Src) 97.5 F (36.4 C) (Oral)  Resp 14  SpO2 99%  LMP 04/20/2012  Physical Exam  Nursing note and vitals  reviewed. Constitutional: She is oriented to person, place, and time. She appears well-developed and well-nourished. No distress.  HENT:  Head: Normocephalic and atraumatic.  Eyes: Conjunctivae and EOM are normal.  Neck: Normal range of motion.  No nuchal rigidity  Cardiovascular:  Regular rate rhythm.  Intact distal pulses.  No pitting edema.  Pulmonary/Chest: Effort normal.  Lungs good auscultation bilaterally.  Normal breathing effort.  No chest wall tenderness.  Abdominal:  Soft obese abdomen without tenderness or peritoneal signs  Musculoskeletal: Normal range of motion.  Neurological: She is alert and oriented to person, place, and time.  Skin: Skin is warm and dry. No rash noted. She is not diaphoretic.  Psychiatric: She has a normal mood and affect. Her behavior is normal.    ED Course  Procedures (including critical care time)  Labs Reviewed  CBC - Abnormal; Notable for the following:    RBC 3.77 (*)    Hemoglobin 10.8 (*)    HCT 31.9 (*)    All other components within normal limits  BASIC METABOLIC PANEL  LIPASE, BLOOD  URINALYSIS, ROUTINE W REFLEX MICROSCOPIC  POCT I-STAT TROPONIN I  POCT PREGNANCY, URINE   Dg Chest 2 View  04/27/2012  *RADIOLOGY REPORT*  Clinical Data: Chest pain, shortness of breath  CHEST - 2 VIEW  Comparison:  None.  Findings:  The heart size and mediastinal contours are within normal limits.  Both lungs are  clear.  The visualized skeletal structures are unremarkable.  IMPRESSION: No active cardiopulmonary disease.   Original Report Authenticated By: Judie Petit. Miles Costain, M.D.     Date: 04/27/2012  Rate: 87  Rhythm: normal sinus rhythm  QRS Axis: normal  Intervals: normal  ST/T Wave abnormalities: normal  Conduction Disutrbances: none  Narrative Interpretation:   Old EKG Reviewed: No significant changes noted   No diagnosis found.  MDM  Patient emergency department complaining of fatigue, decreased sleep, snoring and general sickness.  Patient  was in no acute distress with normal vital signs on arrival.  Labs and imaging reviewed without acute abnormality seen.  Recommend follow up with PCP for sleep apnea study which was discussed in depth with patient who verbalizes understanding.  Low concern for cardiac or pulmonary etiology of chest pain as patient had normal EKG, chest x-ray and negative troponin x1.  Discuss possible addition of proton pump inhibitor for acid reflux type symptoms.  The patient will be given a resource guide in his portable care act information.        Jaci Carrel, [place] 04/27/12 1435

## 2012-04-27 NOTE — ED Notes (Addendum)
Pt states she has had chest pain for 3 weeks. Pt states she has been feeling lightheaded and having night sweats. Pt has open abcess left axillary since Nov. States "my body just feels really sick."

## 2012-04-27 NOTE — ED Provider Notes (Signed)
Medical screening examination/treatment/procedure(s) were performed by non-physician practitioner and as supervising physician I was immediately available for consultation/collaboration.   Elizabeth Mendoza. Destyn Schuyler, MD 04/27/12 1515

## 2012-09-03 ENCOUNTER — Ambulatory Visit (HOSPITAL_BASED_OUTPATIENT_CLINIC_OR_DEPARTMENT_OTHER): Payer: Medicaid Other | Attending: Internal Medicine

## 2012-10-30 ENCOUNTER — Emergency Department (INDEPENDENT_AMBULATORY_CARE_PROVIDER_SITE_OTHER)
Admission: EM | Admit: 2012-10-30 | Discharge: 2012-10-30 | Disposition: A | Discharge status: Home / Self Care | Payer: Medicaid Other | Source: Home / Self Care

## 2012-10-30 ENCOUNTER — Encounter (HOSPITAL_COMMUNITY): Payer: Self-pay | Admitting: *Deleted

## 2012-10-30 ENCOUNTER — Other Ambulatory Visit (HOSPITAL_COMMUNITY)
Admission: RE | Admit: 2012-10-30 | Discharge: 2012-10-30 | Disposition: A | Discharge status: Home / Self Care | Payer: Medicaid Other | Source: Ambulatory Visit | Attending: Family Medicine | Admitting: Family Medicine

## 2012-10-30 DIAGNOSIS — N760 Acute vaginitis: Secondary | ICD-10-CM | POA: Insufficient documentation

## 2012-10-30 DIAGNOSIS — N730 Acute parametritis and pelvic cellulitis: Secondary | ICD-10-CM

## 2012-10-30 DIAGNOSIS — Z113 Encounter for screening for infections with a predominantly sexual mode of transmission: Secondary | ICD-10-CM | POA: Insufficient documentation

## 2012-10-30 LAB — POCT URINALYSIS DIP (DEVICE)
Hgb urine dipstick: NEGATIVE
Nitrite: NEGATIVE
Urobilinogen, UA: 0.2 mg/dL (ref 0.0–1.0)
pH: 6 (ref 5.0–8.0)

## 2012-10-30 MED ORDER — AZITHROMYCIN 250 MG PO TABS
1000.0000 mg | ORAL_TABLET | Freq: Once | ORAL | Status: AC
  Administered 2012-10-30: 1000 mg via ORAL

## 2012-10-30 MED ORDER — CEFTRIAXONE SODIUM 1 G IJ SOLR
1.0000 g | Freq: Once | INTRAMUSCULAR | Status: AC
  Administered 2012-10-30: 1 g via INTRAMUSCULAR

## 2012-10-30 MED ORDER — METRONIDAZOLE 500 MG PO TABS: 500.0000 mg | ORAL_TABLET | Freq: Two times a day (BID) | ORAL | Status: DC

## 2012-10-30 NOTE — ED Provider Notes (Addendum)
CSN: [id number]     Arrival date & time 10/30/12  1120 History   None    Chief Complaint  Patient presents with  . Vaginal Discharge   (Consider location/radiation/quality/duration/timing/severity/associated sxs/prior Treatment) Patient is a 32 y.o. female presenting with vaginal discharge. The history is provided by the patient.  Vaginal Discharge Quality:  Malodorous Severity:  Mild Duration:  2 weeks Progression:  Worsening Chronicity:  New Associated symptoms: abdominal pain and dyspareunia   Associated symptoms: no dysuria, no fever, no nausea and no vomiting     Past Medical History  Diagnosis Date  . No pertinent past medical history   . Shortness of breath   . Ulcer    Past Surgical History  Procedure Laterality Date  . Induced abortion    . Dilation and curettage of uterus     Family History  Problem Relation Age of Onset  . Anesthesia problems Neg Hx    History  Substance Use Topics  . Smoking status: Former Smoker    Types: Cigarettes  . Smokeless tobacco: Not on file  . Alcohol Use: Yes   OB History   Grav Para Term Preterm Abortions TAB SAB Ect Mult Living   7 4 4  0 3 3 0 0 0 4     Review of Systems  Constitutional: Negative for fever.  Gastrointestinal: Positive for abdominal pain. Negative for nausea and vomiting.  Genitourinary: Positive for vaginal discharge, pelvic pain and dyspareunia. Negative for dysuria, flank pain and menstrual problem.    Allergies  Review of patient's allergies indicates no known allergies.  Home Medications   Current Outpatient Rx  Name  Route  Sig  Dispense  Refill  . metroNIDAZOLE (FLAGYL) 500 MG tablet   Oral   Take 1 tablet (500 mg total) by mouth 2 (two) times daily.   14 tablet   0   . naproxen (NAPROSYN) 500 MG tablet   Oral   Take 1 tablet (500 mg total) by mouth 2 (two) times daily.   30 tablet   0    BP 111/79  Pulse 81  Temp(Src) 98.4 F (36.9 C) (Oral)  Resp 18  SpO2 99%  LMP  10/10/2012 Physical Exam  Nursing note and vitals reviewed. Constitutional: She appears well-developed and well-nourished.  Abdominal: Soft. Bowel sounds are normal. She exhibits no distension and no mass. There is no tenderness. There is no rebound and no guarding.  Genitourinary: Uterus normal. There is no lesion on the right labia. There is no lesion on the left labia. Cervix exhibits motion tenderness and discharge. Cervix exhibits no friability. Right adnexum displays no mass and no tenderness. Left adnexum displays no mass and no tenderness. There is erythema around the vagina. No tenderness or bleeding around the vagina. Vaginal discharge found.  Skin: Skin is warm and dry.    ED Course  Procedures (including critical care time) Labs Review Labs Reviewed  POCT URINALYSIS DIP (DEVICE) - Abnormal; Notable for the following:    Leukocytes, UA TRACE (*)    All other components within normal limits  CERVICOVAGINAL ANCILLARY ONLY   Imaging Review No results found.  MDM      Linna Hoff, MD 10/30/12 1231  Linna Hoff, MD 10/30/12 [phone]

## 2012-10-30 NOTE — ED Notes (Signed)
Pt  Reports  symptpms  Of low  abd  Pain  With a  Vaginal  Discharge       And  A  Foul  Smelling  Odor   For  About 1  Week   She  Ambulated  To  Room with a  Slow  Steady gait        She  Is  Sitting  Upright on  Exam table  With a  Slow  Steady gait

## 2012-11-02 ENCOUNTER — Telehealth (HOSPITAL_COMMUNITY): Payer: Self-pay | Admitting: Emergency Medicine

## 2012-11-02 MED ORDER — FLUCONAZOLE 150 MG PO TABS: 150.0000 mg | ORAL_TABLET | Freq: Once | ORAL | Status: DC

## 2012-11-02 NOTE — Telephone Encounter (Signed)
Message copied by Reuben Likes on Fri Nov 02, 2012  7:56 PM ------      Message from: Vassie Moselle      Created: Fri Nov 02, 2012  7:35 PM      Regarding: lab       Gardnerella and Candida pos. Rest of labs neg.  Pt. of Dr. Artis Flock.  He treated with Flagyl. I sent him a message yesterday and asked him if he wanted to treat the Candida.  He did not answer.      Vassie Moselle      11/02/2012       ------

## 2012-11-02 NOTE — ED Notes (Signed)
Her DNA probe came back positive for Gardnerella and Candida. She was treated for Gardnerella but not candida. We'll send in a prescription for Diflucan 150 mg, one tablet, one time only to her pharmacy.  Reuben Likes, MD 11/02/12 [phone]

## 2012-11-05 ENCOUNTER — Telehealth (HOSPITAL_COMMUNITY): Payer: Self-pay | Admitting: *Deleted

## 2012-11-05 NOTE — ED Notes (Signed)
GC/Chlamydia neg., Affirm: Candida and Gardnerella pos. and Trich neg.  Pt. adequately treated with Flagyl.  Message sent to Dr. Artis Flock 10/2 and Dr. Lorenz Coaster 10/3.  Dr. Lorenz Coaster e-prescribed Diflucan to [facility] on [address].  I called but pt.'s VM is not set up. Unable to leave a message.  I called contact and left a message for pt. to call. Vassie Moselle 11/05/2012

## 2012-11-06 ENCOUNTER — Telehealth (HOSPITAL_COMMUNITY): Payer: Self-pay | Admitting: *Deleted

## 2012-11-06 NOTE — ED Notes (Signed)
Pt. called back on VM yesterday.  I called her back today but VM not set up. Pt. called back.  Pt. verified x 2 and given results.  Pt. told she was adequately treated for bacterial vaginosis with Flagyl and needs Diflucan for yeast infection. Pt. told where to pick up Rx. Pt. states she tried Monistat but it makes her burn inside.  She prefers the pill.  Vassie Moselle 11/06/2012

## 2013-06-18 ENCOUNTER — Ambulatory Visit (INDEPENDENT_AMBULATORY_CARE_PROVIDER_SITE_OTHER): Payer: Medicaid Other

## 2013-06-18 DIAGNOSIS — N926 Irregular menstruation, unspecified: Secondary | ICD-10-CM

## 2013-06-18 DIAGNOSIS — Z3201 Encounter for pregnancy test, result positive: Secondary | ICD-10-CM

## 2013-06-18 LAB — POCT PREGNANCY, URINE: PREG TEST UR: POSITIVE — AB

## 2013-06-18 MED ORDER — PRENATAL VITAMINS PLUS 27-1 MG PO TABS: 1.0000 | ORAL_TABLET | Freq: Every day | ORAL | Status: DC

## 2013-06-18 NOTE — Progress Notes (Signed)
Pt. Here today for pregnancy test. Reports having regular periods and LMP 05/08/2013. Pregnancy test positive. Patient is 3056w0d today based on LMP EDD 02/12/2014. Patient would like to begin care here. New OB labs to be drawn today. Ultrasound to be scheduled for growth and anatomy: scheduled for 09/24/13 at 0915.  Letter of pregnancy verification given. Patient to schedule new OB appointment for 5-6 weeks from today. List of approved OTC medications given to patient-- patient reports occasional nausea. Advised patient to eat several small meals throughout the day versus 3 large meals and stay well hydrated. Advised patient to call clinic with any questions and to go to [facility] for any heavy bleeding/fluid, cramping or pain. Pt. Verbalized understanding. No questions or concerns.

## 2013-06-19 ENCOUNTER — Telehealth: Payer: Self-pay | Admitting: *Deleted

## 2013-06-19 LAB — OBSTETRIC PANEL
Antibody Screen: NEGATIVE
BASOS ABS: 0 10*3/uL (ref 0.0–0.1)
BASOS PCT: 0 % (ref 0–1)
Eosinophils Absolute: 0.1 10*3/uL (ref 0.0–0.7)
Eosinophils Relative: 1 % (ref 0–5)
HEMATOCRIT: 30.8 % — AB (ref 36.0–46.0)
HEMOGLOBIN: 10.3 g/dL — AB (ref 12.0–15.0)
HEP B S AG: NEGATIVE
LYMPHS PCT: 33 % (ref 12–46)
Lymphs Abs: 2 10*3/uL (ref 0.7–4.0)
MCH: 27.8 pg (ref 26.0–34.0)
MCHC: 33.4 g/dL (ref 30.0–36.0)
MCV: 83.2 fL (ref 78.0–100.0)
MONO ABS: 0.4 10*3/uL (ref 0.1–1.0)
MONOS PCT: 6 % (ref 3–12)
NEUTROS ABS: 3.6 10*3/uL (ref 1.7–7.7)
NEUTROS PCT: 60 % (ref 43–77)
Platelets: 318 10*3/uL (ref 150–400)
RBC: 3.7 MIL/uL — AB (ref 3.87–5.11)
RDW: 17.1 % — ABNORMAL HIGH (ref 11.5–15.5)
RH TYPE: POSITIVE
RUBELLA: 2.86 {index} — AB (ref <0.90)
WBC: 6 10*3/uL (ref 4.0–10.5)

## 2013-06-19 LAB — HIV ANTIBODY (ROUTINE TESTING W REFLEX): HIV: NONREACTIVE

## 2013-06-19 NOTE — Telephone Encounter (Signed)
Pt left message that her Rx for prenatal vitamins was sent to the wrong pharmacy. She needs it sent to [facility]/[address] location. I called pt and stated that the Rx was sent yesterday to the location she requested.  I will call and speak to the pharmacist.  After speaking with the pharmacy staff I was informed that Medicaid will not pay for the rx because they are saying that pt has recently received PNV's at another pharmacy. When I told this to the pt she stated that she has not received any PNV since her last child was born a year ago. I recommended that she call [facility] and discuss the situation with their staff again. I also stated that either she or [facility] staff will probably need to call and speak with a Medicaid representative. Pt voiced understanding.

## 2013-06-20 LAB — HEMOGLOBINOPATHY EVALUATION
HGB F QUANT: 1.6 % (ref 0.0–2.0)
HGB S QUANTITAION: 0 %
Hemoglobin Other: 0 %
Hgb A2 Quant: 2.9 % (ref 2.2–3.2)
Hgb A: 95.5 % — ABNORMAL LOW (ref 96.8–97.8)

## 2013-06-27 ENCOUNTER — Other Ambulatory Visit: Payer: Self-pay | Admitting: Internal Medicine

## 2013-06-27 DIAGNOSIS — R070 Pain in throat: Secondary | ICD-10-CM

## 2013-07-03 ENCOUNTER — Other Ambulatory Visit: Payer: Medicaid Other

## 2013-07-04 ENCOUNTER — Other Ambulatory Visit: Payer: Medicaid Other

## 2013-07-08 ENCOUNTER — Other Ambulatory Visit: Payer: Medicaid Other

## 2013-07-09 ENCOUNTER — Other Ambulatory Visit: Payer: Medicaid Other

## 2013-07-24 ENCOUNTER — Encounter: Payer: Medicaid Other | Admitting: Family

## 2013-07-26 ENCOUNTER — Ambulatory Visit
Admission: RE | Admit: 2013-07-26 | Discharge: 2013-07-26 | Disposition: A | Discharge status: Home / Self Care | Payer: Medicaid Other | Source: Ambulatory Visit | Attending: Internal Medicine | Admitting: Internal Medicine

## 2013-07-26 DIAGNOSIS — R070 Pain in throat: Secondary | ICD-10-CM

## 2013-08-27 ENCOUNTER — Ambulatory Visit (INDEPENDENT_AMBULATORY_CARE_PROVIDER_SITE_OTHER): Payer: Medicaid Other | Admitting: Obstetrics & Gynecology

## 2013-08-27 ENCOUNTER — Encounter: Payer: Self-pay | Admitting: Obstetrics & Gynecology

## 2013-08-27 ENCOUNTER — Other Ambulatory Visit (HOSPITAL_COMMUNITY)
Admission: RE | Admit: 2013-08-27 | Discharge: 2013-08-27 | Disposition: A | Discharge status: Home / Self Care | Payer: Medicaid Other | Source: Ambulatory Visit | Attending: Obstetrics & Gynecology | Admitting: Obstetrics & Gynecology

## 2013-08-27 VITALS — BP 118/64 | HR 89 | Temp 97.9°F | Wt 207.4 lb

## 2013-08-27 DIAGNOSIS — Z1151 Encounter for screening for human papillomavirus (HPV): Secondary | ICD-10-CM | POA: Insufficient documentation

## 2013-08-27 DIAGNOSIS — Z113 Encounter for screening for infections with a predominantly sexual mode of transmission: Secondary | ICD-10-CM | POA: Insufficient documentation

## 2013-08-27 DIAGNOSIS — Z01419 Encounter for gynecological examination (general) (routine) without abnormal findings: Secondary | ICD-10-CM | POA: Insufficient documentation

## 2013-08-27 DIAGNOSIS — Z3480 Encounter for supervision of other normal pregnancy, unspecified trimester: Secondary | ICD-10-CM | POA: Insufficient documentation

## 2013-08-27 DIAGNOSIS — E669 Obesity, unspecified: Secondary | ICD-10-CM | POA: Insufficient documentation

## 2013-08-27 DIAGNOSIS — Z3482 Encounter for supervision of other normal pregnancy, second trimester: Secondary | ICD-10-CM

## 2013-08-27 DIAGNOSIS — O99210 Obesity complicating pregnancy, unspecified trimester: Secondary | ICD-10-CM | POA: Insufficient documentation

## 2013-08-27 LAB — POCT URINALYSIS DIP (DEVICE)
Bilirubin Urine: NEGATIVE
Glucose, UA: NEGATIVE mg/dL
Hgb urine dipstick: NEGATIVE
Ketones, ur: NEGATIVE mg/dL
Nitrite: NEGATIVE
Protein, ur: NEGATIVE mg/dL
Specific Gravity, Urine: 1.025 (ref 1.005–1.030)
Urobilinogen, UA: 1 mg/dL (ref 0.0–1.0)
pH: 6 (ref 5.0–8.0)

## 2013-08-27 LAB — GLUCOSE TOLERANCE, 1 HOUR (50G) W/O FASTING: Glucose, 1 Hour GTT: 118 mg/dL (ref 70–140)

## 2013-08-27 LAB — OB RESULTS CONSOLE GBS: STREP GROUP B AG: NEGATIVE

## 2013-08-27 NOTE — Progress Notes (Signed)
Reports occasional edema in feet causing some mild pain. Also reports having a knot in her breast-- no longer there-- but concerned because it left a mark.  Discussed appropriate weight gain (11-20lb)- patient verbalized understanding.  Early 1hr gtt today to be drawn at 953. and pap.  New OB labs already done.  New OB packet given.

## 2013-08-27 NOTE — Progress Notes (Signed)
Subjective:    Elizabeth Mendoza is a [id number] 4262w6d being seen today for her first obstetrical visit.  Her obstetrical history is significant for obesity. Patient does not intend to breast feed. Pregnancy history fully reviewed.  Patient reports no complaints.  Filed Vitals:   08/27/13 0848  BP: 118/64  Pulse: 89  Temp: 97.9 F (36.6 C)  Weight: 207 lb 6.4 oz (94.076 kg)    HISTORY: OB History  Gravida Para Term Preterm AB SAB TAB Ectopic Multiple Living  8 4 4  0 3 0 3 0 0 4    # Outcome Date GA Lbr Len/2nd Weight Sex Delivery Anes PTL Lv  8 CUR           7 TRM 11/09/11 235w3d 31:30 / 00:40 8 lb 6.9 oz (3.825 kg) M SVD EPI  Y  6 TRM 07/16/08 2133w0d  7 lb 2 oz (3.232 kg) F SVD   Y     Comments: No complications  5 TRM 10/27/06 133w0d  7 lb 2 oz (3.232 kg) M SVD   Y     Comments: No complications  4 TRM 01/04/04 6733w0d  8 lb 7 oz (3.827 kg) F SVD   Y     Comments: No complications  3 TAB           2 TAB           1 TAB              Past Medical History  Diagnosis Date  . No pertinent past medical history   . Shortness of breath   . Ulcer    Past Surgical History  Procedure Laterality Date  . Induced abortion    . Dilation and curettage of uterus     Family History  Problem Relation Age of Onset  . Anesthesia problems Neg Hx   . Diabetes Father      Exam    Uterus:     Pelvic Exam:    Perineum: No Hemorrhoids   Vulva: normal   Vagina:  normal mucosa   pH:    Cervix: anteverted   Adnexa: normal adnexa   Bony Pelvis: android  System: Breast:  normal appearance, no masses or tenderness   Skin: normal coloration and turgor, no rashes    Neurologic: oriented   Extremities: normal strength, tone, and muscle mass   HEENT PERRLA   Mouth/Teeth mucous membranes moist, pharynx normal without lesions   Neck supple   Cardiovascular: regular rate and rhythm   Respiratory:  appears well, vitals normal, no respiratory distress, acyanotic, normal RR, ear and throat  exam is normal, neck free of mass or lymphadenopathy, chest clear, no wheezing, crepitations, rhonchi, normal symmetric air entry   Abdomen: soft, non-tender; bowel sounds normal; no masses,  no organomegaly   Urinary: urethral meatus normal      Assessment:    Pregnancy: [id number] Patient Active Problem List   Diagnosis Date Noted  . Supervision of other normal pregnancy 08/27/2013  . Obesity complicating pregnancy, childbirth, or the puerperium, antepartum condition or complication(649.13) 08/27/2013  . Active labor 11/09/2011  . Normal vaginal delivery 11/09/2011        Plan:     Initial labs drawn. Prenatal vitamins. Problem list reviewed and updated. Genetic Screening discussed Quad Screen: requested. And will be drawn today.  Ultrasound discussed; fetal survey: ordered and is already scheduled for around [redacted] weeks EGA.  Follow up in 4 weeks.    Paxton Kanaan  C. 08/27/2013

## 2013-08-29 LAB — PRESCRIPTION MONITORING PROFILE (19 PANEL)
Amphetamine/Meth: NEGATIVE ng/mL
BARBITURATE SCREEN, URINE: NEGATIVE ng/mL
Benzodiazepine Screen, Urine: NEGATIVE ng/mL
Buprenorphine, Urine: NEGATIVE ng/mL
COCAINE METABOLITES: NEGATIVE ng/mL
Cannabinoid Scrn, Ur: NEGATIVE ng/mL
Carisoprodol, Urine: NEGATIVE ng/mL
Creatinine, Urine: 227.95 mg/dL (ref >20.0)
ECSTASY: NEGATIVE ng/mL
FENTANYL URINE: NEGATIVE ng/mL
MEPERIDINE UR: NEGATIVE ng/mL
Methadone Screen, Urine: NEGATIVE ng/mL
Methaqualone: NEGATIVE ng/mL
NITRITES URINE, INITIAL: NEGATIVE ug/mL
OXYCODONE SCRN UR: NEGATIVE ng/mL
Opiate Screen, Urine: NEGATIVE ng/mL
PH URINE, INITIAL: 6.7 pH (ref 4.5–8.9)
Phencyclidine, Ur: NEGATIVE ng/mL
Propoxyphene: NEGATIVE ng/mL
TRAMADOL UR: NEGATIVE ng/mL
Tapentadol, urine: NEGATIVE ng/mL
Zolpidem, Urine: NEGATIVE ng/mL

## 2013-08-29 LAB — CULTURE, OB URINE: Colony Count: >=100000

## 2013-08-29 LAB — CYTOLOGY - PAP

## 2013-09-11 ENCOUNTER — Telehealth: Payer: Self-pay

## 2013-09-11 NOTE — Telephone Encounter (Signed)
Called patient's pharmacy and spoke to pharmacist who informed me they filled PNV RX 5/21 but not since then. She stated she would refill it now and it will be available for pick up in 1.5hours. Called patient and informed her that RX will be available and that she has 9 refills left. Patient verbalized understanding and gratitude. No further questions or concerns.

## 2013-09-11 NOTE — Telephone Encounter (Signed)
Patient called stating her pharmacy ([facility] [address]) states they did not receive PNV RX. Per chart review, receipt confirms pharmacy did receive the RX. Attempted to call pharmacy but it was closed.

## 2013-09-18 ENCOUNTER — Ambulatory Visit (HOSPITAL_BASED_OUTPATIENT_CLINIC_OR_DEPARTMENT_OTHER): Payer: Medicaid Other

## 2013-09-22 ENCOUNTER — Encounter (HOSPITAL_BASED_OUTPATIENT_CLINIC_OR_DEPARTMENT_OTHER): Payer: Medicaid Other

## 2013-09-24 ENCOUNTER — Ambulatory Visit (INDEPENDENT_AMBULATORY_CARE_PROVIDER_SITE_OTHER): Payer: Medicaid Other | Admitting: Family

## 2013-09-24 ENCOUNTER — Ambulatory Visit (HOSPITAL_COMMUNITY)
Admission: RE | Admit: 2013-09-24 | Discharge: 2013-09-24 | Disposition: A | Discharge status: Home / Self Care | Payer: Medicaid Other | Source: Ambulatory Visit | Attending: Obstetrics & Gynecology | Admitting: Obstetrics & Gynecology

## 2013-09-24 VITALS — BP 109/65 | HR 92 | Temp 98.0°F | Wt 212.2 lb

## 2013-09-24 DIAGNOSIS — Z363 Encounter for antenatal screening for malformations: Secondary | ICD-10-CM | POA: Insufficient documentation

## 2013-09-24 DIAGNOSIS — Z1389 Encounter for screening for other disorder: Secondary | ICD-10-CM

## 2013-09-24 DIAGNOSIS — L309 Dermatitis, unspecified: Secondary | ICD-10-CM

## 2013-09-24 DIAGNOSIS — L259 Unspecified contact dermatitis, unspecified cause: Secondary | ICD-10-CM

## 2013-09-24 DIAGNOSIS — Z3482 Encounter for supervision of other normal pregnancy, second trimester: Secondary | ICD-10-CM

## 2013-09-24 DIAGNOSIS — Z3201 Encounter for pregnancy test, result positive: Secondary | ICD-10-CM

## 2013-09-24 DIAGNOSIS — Z3480 Encounter for supervision of other normal pregnancy, unspecified trimester: Secondary | ICD-10-CM

## 2013-09-24 LAB — POCT URINALYSIS DIP (DEVICE)
Bilirubin Urine: NEGATIVE
Glucose, UA: NEGATIVE mg/dL
HGB URINE DIPSTICK: NEGATIVE
KETONES UR: 15 mg/dL — AB
Leukocytes, UA: NEGATIVE
Nitrite: NEGATIVE
PH: 6 (ref 5.0–8.0)
PROTEIN: NEGATIVE mg/dL
SPECIFIC GRAVITY, URINE: 1.025 (ref 1.005–1.030)
Urobilinogen, UA: 1 mg/dL (ref 0.0–1.0)

## 2013-09-24 MED ORDER — DESONIDE 0.05 % EX CREA: TOPICAL_CREAM | Freq: Two times a day (BID) | CUTANEOUS | Status: DC

## 2013-09-24 NOTE — Progress Notes (Signed)
Pt has concerns about skin rash

## 2013-09-24 NOTE — Progress Notes (Signed)
[id number] ordered referral to dermatology however patient has [facility], she will need to get referral from PCP.

## 2013-09-24 NOTE — Progress Notes (Signed)
Pt with report of increased rash on face (history of eczema); moved here recently, does not have dermatologist in town.  RX Desonide, refer to dermatology.  OB ultrasound scheduled for today.

## 2013-10-19 ENCOUNTER — Encounter (HOSPITAL_COMMUNITY): Payer: Self-pay | Admitting: Emergency Medicine

## 2013-10-19 ENCOUNTER — Emergency Department (HOSPITAL_COMMUNITY)
Admission: EM | Admit: 2013-10-19 | Discharge: 2013-10-19 | Disposition: A | Discharge status: Home / Self Care | Payer: Medicaid Other | Attending: Emergency Medicine | Admitting: Emergency Medicine

## 2013-10-19 DIAGNOSIS — Z87891 Personal history of nicotine dependence: Secondary | ICD-10-CM | POA: Diagnosis not present

## 2013-10-19 DIAGNOSIS — K0381 Cracked tooth: Secondary | ICD-10-CM | POA: Diagnosis not present

## 2013-10-19 DIAGNOSIS — K089 Disorder of teeth and supporting structures, unspecified: Secondary | ICD-10-CM | POA: Diagnosis not present

## 2013-10-19 DIAGNOSIS — K029 Dental caries, unspecified: Secondary | ICD-10-CM

## 2013-10-19 DIAGNOSIS — Z79899 Other long term (current) drug therapy: Secondary | ICD-10-CM | POA: Insufficient documentation

## 2013-10-19 DIAGNOSIS — Z791 Long term (current) use of non-steroidal anti-inflammatories (NSAID): Secondary | ICD-10-CM | POA: Insufficient documentation

## 2013-10-19 DIAGNOSIS — Z872 Personal history of diseases of the skin and subcutaneous tissue: Secondary | ICD-10-CM | POA: Insufficient documentation

## 2013-10-19 DIAGNOSIS — IMO0002 Reserved for concepts with insufficient information to code with codable children: Secondary | ICD-10-CM | POA: Insufficient documentation

## 2013-10-19 DIAGNOSIS — O9989 Other specified diseases and conditions complicating pregnancy, childbirth and the puerperium: Secondary | ICD-10-CM | POA: Insufficient documentation

## 2013-10-19 MED ORDER — PENICILLIN V POTASSIUM 250 MG PO TABS
500.0000 mg | ORAL_TABLET | Freq: Once | ORAL | Status: AC
  Administered 2013-10-19: 500 mg via ORAL
  Filled 2013-10-19: qty 2

## 2013-10-19 MED ORDER — ACETAMINOPHEN 500 MG PO TABS
1000.0000 mg | ORAL_TABLET | Freq: Once | ORAL | Status: AC
  Administered 2013-10-19: 1000 mg via ORAL
  Filled 2013-10-19: qty 2

## 2013-10-19 MED ORDER — PENICILLIN V POTASSIUM 500 MG PO TABS: 500.0000 mg | ORAL_TABLET | Freq: Four times a day (QID) | ORAL | Status: AC

## 2013-10-19 NOTE — ED Notes (Signed)
Pt. reports progressing right upper and lower molars pain onset this week unrelieved by OTC pain medication . Pt. stated she is 5 months pregnant.

## 2013-10-19 NOTE — ED Provider Notes (Signed)
TIME SEEN: 9:10 PM  CHIEF COMPLAINT: Dental pain  HPI: Patient is a 33 y.o. F who is 5 months pregnant who presents emergency department with bilateral upper and right lower molar pain that started this week. No facial swelling, erythema or warmth, drainage along her gum line, trismus or drooling. No fevers or chills. No nausea, vomiting or diarrhea. She is feeling her baby move regularly. No contractions, vaginal bleeding or discharge, leaking fluid. 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  ROS: See HPI Constitutional: no fever  Eyes: no drainage  ENT: no runny nose   Cardiovascular:  no chest pain  Resp: no SOB  GI: no vomiting GU: no dysuria Integumentary: no rash  Allergy: no hives  Musculoskeletal: no leg swelling  Neurological: no slurred speech ROS otherwise negative  PAST MEDICAL HISTORY/PAST SURGICAL HISTORY:  Past Medical History  Diagnosis Date  . No pertinent past medical history   . Shortness of breath   . Ulcer     MEDICATIONS:  Prior to Admission medications   Medication Sig Start Date End Date Taking? Authorizing Provider  desonide (DESOWEN) 0.05 % cream Apply topically 2 (two) times daily. 09/24/13   Marlis Edelson, CNM  naproxen (NAPROSYN) 500 MG tablet Take 1 tablet (500 mg total) by mouth 2 (two) times daily. 04/27/12   Lisette Paz, PA-C  Prenatal Vit-Fe Fumarate-FA (PRENATAL VITAMINS PLUS) 27-1 MG TABS Take 1 tablet by mouth daily. 06/18/13   Allie Bossier, MD    ALLERGIES:  No Known Allergies  SOCIAL HISTORY:  History  Substance Use Topics  . Smoking status: Former Smoker    Types: Cigarettes  . Smokeless tobacco: Never Used  . Alcohol Use: No    FAMILY HISTORY: Family History  Problem Relation Age of Onset  . Anesthesia problems Neg Hx   . Diabetes Father     EXAM: BP 118/81  Pulse 85  Temp(Src) 97.6 F (36.4 C) (Oral)  Resp 18  Ht  (1.626 m)  Wt 206 lb (93.441 kg)  BMI 35.34 kg/m2  SpO2 100%  LMP 05/08/2013 CONSTITUTIONAL: Alert and  oriented and responds appropriately to questions. Well-appearing; well-nourished HEAD: Normocephalic EYES: Conjunctivae clear, PERRL ENT: normal nose; no rhinorrhea; moist mucous membranes; pharynx without lesions noted, multiple fractured teeth without signs of abscess, no gingival irritation or erythema or swelling, no tonsillar hypertrophy or exudate, nasal deviation, no trismus or drooling, no stridor NECK: Supple, no meningismus, no LAD  CARD: RRR; S1 and S2 appreciated; no murmurs, no clicks, no rubs, no gallops RESP: Normal chest excursion without splinting or tachypnea; breath sounds clear and equal bilaterally; no wheezes, no rhonchi, no rales,  ABD/GI: Normal bowel sounds; non-distended; soft, non-tender, no rebound, no guarding, gravid uterus BACK:  The back appears normal and is non-tender to palpation, there is no CVA tenderness EXT: Normal ROM in all joints; non-tender to palpation; no edema; normal capillary refill; no cyanosis    SKIN: Normal color for age and race; warm NEURO: Moves all extremities equally PSYCH: The patient's mood and manner are appropriate. Grooming and personal hygiene are appropriate.  MEDICAL DECISION MAKING: Patient here with dental pain from fractured teeth. No sign of abscess but will treat prophylactically with penicillin. Have advised patient that given she is pregnant the only medication is a for pain if Tylenol. She agrees that she does not want anything other than Tylenol for pain. She will followup with her dentist as an outpatient. We'll give another digital information for followup  in case she is unable to get an appointment with her dentist soon. Have given her strict return precautions. She verbalized understanding and is comfortable with this plan.     Layla Maw Kristan Votta, DO 10/19/13 2116

## 2013-10-19 NOTE — Discharge Instructions (Signed)
You may take Tylenol 1000 mg every 6 hours as needed for pain. Please do not take more than 4000 mg of Tylenol in a 24-hour period.   Dental Caries Dental caries (also called tooth decay) is the most common oral disease. It can occur at any age but is more common in children and young adults.  HOW DENTAL CARIES DEVELOPS  The process of decay begins when bacteria and foods (particularly sugars and starches) combine in your mouth to produce plaque. Plaque is a substance that sticks to the hard, outer surface of a tooth (enamel). The bacteria in plaque produce acids that attack enamel. These acids may also attack the root surface of a tooth (cementum) if it is exposed. Repeated attacks dissolve these surfaces and create holes in the tooth (cavities). If left untreated, the acids destroy the other layers of the tooth.  RISK FACTORS  Frequent sipping of sugary beverages.   Frequent snacking on sugary and starchy foods, especially those that easily get stuck in the teeth.   Poor oral hygiene.   Dry mouth.   Substance abuse such as methamphetamine abuse.   Broken or poor-fitting dental restorations.   Eating disorders.   Gastroesophageal reflux disease (GERD).   Certain radiation treatments to the head and neck. SYMPTOMS In the early stages of dental caries, symptoms are seldom present. Sometimes white, chalky areas may be seen on the enamel or other tooth layers. In later stages, symptoms may include:  Pits and holes on the enamel.  Toothache after sweet, hot, or cold foods or drinks are consumed.  Pain around the tooth.  Swelling around the tooth. DIAGNOSIS  Most of the time, dental caries is detected during a regular dental checkup. A diagnosis is made after a thorough medical and dental history is taken and the surfaces of your teeth are checked for signs of dental caries. Sometimes special instruments, such as lasers, are used to check for dental caries. Dental X-ray exams  may be taken so that areas not visible to the eye (such as between the contact areas of the teeth) can be checked for cavities.  TREATMENT  If dental caries is in its early stages, it may be reversed with a fluoride treatment or an application of a remineralizing agent at the dental office. Thorough brushing and flossing at home is needed to aid these treatments. If it is in its later stages, treatment depends on the location and extent of tooth destruction:   If a small area of the tooth has been destroyed, the destroyed area will be removed and cavities will be filled with a material such as gold, silver amalgam, or composite resin.   If a large area of the tooth has been destroyed, the destroyed area will be removed and a cap (crown) will be fitted over the remaining tooth structure.   If the center part of the tooth (pulp) is affected, a procedure called a root canal will be needed before a filling or crown can be placed.   If most of the tooth has been destroyed, the tooth may need to be pulled (extracted). HOME CARE INSTRUCTIONS You can prevent, stop, or reverse dental caries at home by practicing good oral hygiene. Good oral hygiene includes:  Thoroughly cleaning your teeth at least twice a day with a toothbrush and dental floss.   Using a fluoride toothpaste. A fluoride mouth rinse may also be used if recommended by your dentist or health care provider.   Restricting the amount of  sugary and starchy foods and sugary liquids you consume.   Avoiding frequent snacking on these foods and sipping of these liquids.   Keeping regular visits with a dentist for checkups and cleanings. PREVENTION   Practice good oral hygiene.  Consider a dental sealant. A dental sealant is a coating material that is applied by your dentist to the pits and grooves of teeth. The sealant prevents food from being trapped in them. It may protect the teeth for several years.  Ask about fluoride  supplements if you live in a community without fluorinated water or with water that has a low fluoride content. Use fluoride supplements as directed by your dentist or health care provider.  Allow fluoride varnish applications to teeth if directed by your dentist or health care provider. Document Released: 10/09/2001 Document Revised: 06/03/2013 Document Reviewed: 01/20/2012 [facility] Patient Information 2015 [place], [place]. This information is not intended to replace advice given to you by your health care provider. Make sure you discuss any questions you have with your health care provider.  Dental Care and Dentist Visits Dental care supports good overall health. Regular dental visits can also help you avoid dental pain, bleeding, infection, and other more serious health problems in the future. It is important to keep the mouth healthy because diseases in the teeth, gums, and other oral tissues can spread to other areas of the body. Some problems, such as diabetes, heart disease, and pre-term labor have been associated with poor oral health.  See your dentist every 6 months. If you experience emergency problems such as a toothache or broken tooth, go to the dentist right away. If you see your dentist regularly, you may catch problems early. It is easier to be treated for problems in the early stages.  WHAT TO EXPECT AT A DENTIST VISIT  Your dentist will look for many common oral health problems and recommend proper treatment. At your regular dental visit, you can expect:  Gentle cleaning of the teeth and gums. This includes scraping and polishing. This helps to remove the sticky substance around the teeth and gums (plaque). Plaque forms in the mouth shortly after eating. Over time, plaque hardens on the teeth as tartar. If tartar is not removed regularly, it can cause problems. Cleaning also helps remove stains.  Periodic X-rays. These pictures of the teeth and supporting bone will help your dentist  assess the health of your teeth.  Periodic fluoride treatments. Fluoride is a natural mineral shown to help strengthen teeth. Fluoride treatmentinvolves applying a fluoride gel or varnish to the teeth. It is most commonly done in children.  Examination of the mouth, tongue, jaws, teeth, and gums to look for any oral health problems, such as:  Cavities (dental caries). This is decay on the tooth caused by plaque, sugar, and acid in the mouth. It is best to catch a cavity when it is small.  Inflammation of the gums caused by plaque buildup (gingivitis).  Problems with the mouth or malformed or misaligned teeth.  Oral cancer or other diseases of the soft tissues or jaws. KEEP YOUR TEETH AND GUMS HEALTHY For healthy teeth and gums, follow these general guidelines as well as your dentist's specific advice:  Have your teeth professionally cleaned at the dentist every 6 months.  Brush twice daily with a fluoride toothpaste.  Floss your teeth daily.  Ask your dentist if you need fluoride supplements, treatments, or fluoride toothpaste.  Eat a healthy diet. Reduce foods and drinks with added sugar.  Avoid smoking. TREATMENT FOR ORAL HEALTH PROBLEMS If you have oral health problems, treatment varies depending on the conditions present in your teeth and gums.  Your caregiver will most likely recommend good oral hygiene at each visit.  For cavities, gingivitis, or other oral health disease, your caregiver will perform a procedure to treat the problem. This is typically done at a separate appointment. Sometimes your caregiver will refer you to another dental specialist for specific tooth problems or for surgery. SEEK IMMEDIATE DENTAL CARE IF:  You have pain, bleeding, or soreness in the gum, tooth, jaw, or mouth area.  A permanent tooth becomes loose or separated from the gum socket.  You experience a blow or injury to the mouth or jaw area. Document Released: 09/29/2010 Document  Revised: 04/11/2011 Document Reviewed: 09/29/2010 [facility] Patient Information 2015 [place], [place]. This information is not intended to replace advice given to you by your health care provider. Make sure you discuss any questions you have with your health care provider.    Emergency Department Resource Guide 1) Find a Doctor and Pay Out of Pocket Although you won't have to find out who is covered by your insurance plan, it is a good idea to ask around and get recommendations. You will then need to call the office and see if the doctor you have chosen will accept you as a new patient and what types of options they offer for patients who are self-pay. Some doctors offer discounts or will set up payment plans for their patients who do not have insurance, but you will need to ask so you aren't surprised when you get to your appointment.  2) Contact Your Local Health Department Not all health departments have doctors that can see patients for sick visits, but many do, so it is worth a call to see if yours does. If you don't know where your local health department is, you can check in your phone book. The CDC also has a tool to help you locate your state's health department, and many state websites also have listings of all of their local health departments.  3) Find a Walk-in Clinic If your illness is not likely to be very severe or complicated, you may want to try a walk in clinic. These are popping up all over the country in pharmacies, drugstores, and shopping centers. They're usually staffed by nurse practitioners or physician assistants that have been trained to treat common illnesses and complaints. They're usually fairly quick and inexpensive. However, if you have serious medical issues or chronic medical problems, these are probably not your best option.  No Primary Care Doctor: - Call Health Connect at  [phone] - they can help you locate a primary care doctor that  accepts your insurance,  provides certain services, etc. - Physician Referral Service- [phone]  Chronic Pain Problems: Organization         Address  Phone   Notes  [facility]  [phone] Patients need to be referred by their primary care doctor.   Medication Assistance: Organization         Address  Phone   Notes  [facility] Medication [place] [address] [place], [place] [address] [phone] --Must be a resident of [facility] -- Must have NO insurance coverage whatsoever (no Medicaid/ Medicare, etc.) -- The pt. MUST have a primary care doctor that directs their care regularly and follows them in the community   [facility]  [phone]  [employer]  [phone]    Agencies that provide inexpensive medical care: Organization         Address  Phone   Notes  [facility]  [phone]   [facility]    [phone]   [facility] [address] [place], [place] [address] [phone]   [facility] [address], [place] [phone]   Planned Parenthood    [phone]   [facility]    [phone]   Community Health and [facility]  [address], [place] Phone:  [phone], Fax:  [phone] Hours of Operation:  9 am - 6 pm, M-F.  Also accepts Medicaid/Medicare and self-pay.  [facility]  [address], [place] Phone: [phone], Fax: [phone]. Hours of Operation:  8:30 am - 5:30 pm, M-F.  Also accepts Medicaid and self-pay.  [facility] [place] [address], [place] Phone: [phone]   [facility] [address] Natasha Bence [place], [place] [phone], Ext. [phone] Mondays & Thursdays: 7-9 AM.  First 15 patients are seen on a first come, first serve basis.    Medicaid-accepting [facility] Providers:  Organization         Address  Phone    Notes  [facility] [address],  [phone] Also accepts self-pay patients.  [facility] [address] Laurell Josephs [place], [place]  [phone]   [facility] [address], [place] [phone]   [facility] [address], [place] [phone]   Renaye Rakers [address], [place]   [phone] Only accepts [place] Access [place] patients after they have their name applied to their card.   Self-Pay (no insurance) in [facility]:  Organization         Address  Phone   Notes  Sickle Cell Patients, [facility] [address] [place], [place] [phone]   [facility] [address] [place], [place] [phone]   [facility] [place]  [address], [place] [phone]   [facility]/Dr. Osei-Bonsu  [address], [place] or [address], [place] [phone] Phone number for both [place] and [place] locations is the same.  Urgent Medical and [facility] [address], [place] [phone]   [facility] [address] [place] or [address] [phone]   [facility] [address], [place] [phone], phone; [phone], fax Sees patients 1st and 3rd Saturday of every month.  Must not qualify for public or private insurance (i.e. Medicaid, Medicare, [place] Health Choice, Veterans' Benefits)  Household income should be no more than 200% of the poverty level The clinic cannot treat you if you are pregnant or think you are pregnant  Sexually transmitted diseases are not treated at the clinic.    Dental Care: Organization         Address  Phone  Notes  [facility] of [facility] [address] [place], [place] [phone] Accepts children up to age 47 who are enrolled in [place] or [place] Health Choice; pregnant women with a Medicaid card;  and children who have applied for Medicaid or [place] Health Choice, but were declined, whose parents can pay a reduced fee at time of service.  [facility] Department of [facility]  [address], [place] [phone] Accepts children up to age 43 who are enrolled in [place] or [place] Health Choice; pregnant women with a Medicaid card; and children who have applied for Medicaid or [place] Health Choice, but were declined, whose parents can pay a reduced fee at time of service.  [facility]  [address] [place], [place] [phone] Patients are seen by appointment only. Walk-ins are not accepted. [facility] will see patients 29 years of age and older. Monday - Tuesday (8am-5pm) Most Wednesdays (8:30-5pm) $30 per visit, cash only  [facility] Adult [facility] Access PRO[facility]  [address], [facility] [phone] Patients are seen by appointment only. Walk-ins are not accepted. [facility] will see patients 82 years of age and older. One Wednesday Evening (Monthly: Volunteer Based).  $30 per visit, cash only  [employer]  [phone] for adults; Children under age 46, call Graduate Pediatric Dentistry at [phone]. Children aged 63-14, please call [phone] to request a pediatric application.  Dental services are provided in all areas of dental care including fillings, crowns and bridges, complete and partial dentures, implants, gum treatment, root canals, and extractions. Preventive care is also provided. Treatment is provided to both adults and children. Patients are selected via a lottery and there is often a waiting list.   [facility], [place] [address], [place]  [phone] [email or web address]   [facility] [address] [place], [place] [phone], Ext.  [phone] Second and Fourth Thursday of each month, opens at 6:30 AM; Clinic ends at 9 AM.  Patients are seen on a first-come first-served basis, and a limited number are seen during each clinic.   [facility]  [address] Ether Griffins [place], [place] [phone]   Eligibility Requirements You must have lived in [place], [place], or [place] counties for at least the last three months.   You cannot be eligible for state or federal sponsored [place], including [employer], [place], or [employer].   You generally cannot be eligible for healthcare insurance through your employer.    How to apply: Eligibility screenings are held every Tuesday and Wednesday afternoon from 1:00 pm until 4:00 pm. You do not need an appointment for the interview!  [facility] [address], [place], [place] [phone]   [facility]  [phone]   [facility]  [phone]   [facility]  [phone]    Behavioral Health Resources in the Community: Intensive Outpatient Programs Organization         Address  Phone  Notes  [facility] [address] [place], [place] [phone]   [facility] [address] [place], [place] [phone]   [facility] [address] [place], [place]  [phone]   [facility] Mental Health [address]  [place], [place] [phone] or [phone]   Substance Abuse Resources Organization         Address  Phone  Notes  [facility] Drug Services  [phone]   [facility]  [phone]   [facility] [place]  [phone]   [facility]  [phone]   Residential &  Outpatient Substance Abuse Program  [phone]   Psychological Services Organization         Address  Phone  Notes  [facility]  [phone]   [facility] Services  [phone]   [facility]  Mental Health [phone] [address], [place] [phone] or [phone]    Mobile Crisis Teams Organization         Address  Phone  Notes  [facility], Mobile Crisis Care Unit  [phone]   [facility]  [address] [place], [place] [phone]   Doristine Locks [address] [place] [phone]    Self-Help/Support Groups Organization         Address  Phone             Notes  Mental Health Assoc. of  - variety of support groups  336- I7437963 Call for more information  Narcotics Anonymous (NA), [facility] [address], [employer] [place]  2 meetings at this location   Statistician         Address  Phone  Notes  [facility] [address],    [place]  [phone]   [facility]  [address], [place] [address], [place], [place] [phone]   [facility] [address] [place], [place] [phone] Admissions: 8am-3pm M-F  [facility] [address],    [place], [place] [phone]   [facility] [address] [place], [place], [place] [phone]   [facility] [address],  [place], [place] [phone]   [facility] - Intensive Outpatient [address] Laurell Josephs [address], [place], [place] [phone]   [facility] ([facility] [address] [place]  [place], [place] [phone] or [phone]   [facility] ([facility]) [address] [place], [place] [phone] Accepts Medicaid  [facility] [address]  [place] [phone] Substance Abuse/Addiction Treatment   [facility] Organization         Address  Phone  Notes  [facility]  [phone]   Angie Fava, PhD [address] Ervin Knack [place], [place]   [phone] or [phone]   [facility] Behavioral   [address] [place], [place] [phone]   [facility] [address], [place], [place] [phone] Insurance/Medicaid/sponsorship through [facility] and Families [address]                                    [place], [place] [phone] Therapy/tele-psych/case  [facility] [address], [place] [phone]    Dr. Lolly Mustache  [phone]   [facility]  [employer] [facility] Dept. 1) [address] [place] 2) [address] [place] 3)  [address], [place] [phone]  [phone]   [facility] Child Abuse Hotline [phone] or [phone] (After Hours)

## 2013-10-22 ENCOUNTER — Encounter: Payer: Self-pay | Admitting: Obstetrics and Gynecology

## 2013-10-22 ENCOUNTER — Encounter: Payer: Medicaid Other | Admitting: Obstetrics and Gynecology

## 2013-10-30 ENCOUNTER — Telehealth: Payer: Self-pay | Admitting: *Deleted

## 2013-10-30 NOTE — Telephone Encounter (Signed)
Kelvin called and left a message that she has been having migraines for 2 weeks. States she is taking tylenol  And they are still coming.  Request a call. Per chart review saw that she went to ER 10/19/13 for molar pain.   [facility] that it is [facility] pain.  States it is headache pain=6-7 that has been unrelieved by extra strength tylenol.  C/o blurry vision at times. Denies edema of feet/ legs/ ankles, etc. States she had migraines when she was about 33 years old and none since then.  Denies any other migraine symptoms.  Denies history of blood pressure issues. Advised Kaisey to come to [facility] for evaluation to rule out preeclampsia and explained to her it is important to come for evaluation as it could be blood pressure which could be serious for her and her baby.  She states she doesn't have a reliable ride but she will try. I emphasized importance of coming for evaluation; especially if symptoms worsen.

## 2013-11-12 ENCOUNTER — Ambulatory Visit (INDEPENDENT_AMBULATORY_CARE_PROVIDER_SITE_OTHER): Payer: Medicaid Other | Admitting: Family

## 2013-11-12 VITALS — BP 97/64 | HR 95 | Wt 211.5 lb

## 2013-11-12 DIAGNOSIS — Z3482 Encounter for supervision of other normal pregnancy, second trimester: Secondary | ICD-10-CM

## 2013-11-12 LAB — CBC
HCT: 27.7 % — ABNORMAL LOW (ref 36.0–46.0)
Hemoglobin: 9.5 g/dL — ABNORMAL LOW (ref 12.0–15.0)
MCH: 29.1 pg (ref 26.0–34.0)
MCHC: 34.3 g/dL (ref 30.0–36.0)
MCV: 85 fL (ref 78.0–100.0)
Platelets: 280 10*3/uL (ref 150–400)
RBC: 3.26 MIL/uL — ABNORMAL LOW (ref 3.87–5.11)
RDW: 15.2 % (ref 11.5–15.5)
WBC: 8.6 10*3/uL (ref 4.0–10.5)

## 2013-11-12 LAB — POCT URINALYSIS DIP (DEVICE)
Bilirubin Urine: NEGATIVE
GLUCOSE, UA: NEGATIVE mg/dL
HGB URINE DIPSTICK: NEGATIVE
KETONES UR: NEGATIVE mg/dL
Nitrite: NEGATIVE
Protein, ur: 30 mg/dL — AB
SPECIFIC GRAVITY, URINE: 1.02 (ref 1.005–1.030)
Urobilinogen, UA: 2 mg/dL — ABNORMAL HIGH (ref 0.0–1.0)
pH: 7.5 (ref 5.0–8.0)

## 2013-11-12 MED ORDER — TETANUS-DIPHTH-ACELL PERTUSSIS 5-2.5-18.5 LF-MCG/0.5 IM SUSP: 0.5000 mL | Freq: Once | INTRAMUSCULAR | Status: DC

## 2013-11-12 NOTE — Progress Notes (Signed)
I examined pt and agree with documentation above and nurse midwife student plan of care. Azlan Hanway N Karim, CNM  

## 2013-11-12 NOTE — Progress Notes (Signed)
Doing well.  Reports that she is not able to eat what she usually eats, but denies any nausea/vomitting.  Completed 1 hour glucose challenge today.  Desires to have Flu and Tdap vaccines at next visit.  Denies vaginal bleeding or LOF. Positive fetal movement.

## 2013-11-12 NOTE — Progress Notes (Signed)
C/o headaches on and off, takes tylenol with relief. C/o not having a good appetite.

## 2013-11-13 ENCOUNTER — Encounter (HOSPITAL_BASED_OUTPATIENT_CLINIC_OR_DEPARTMENT_OTHER): Payer: Medicaid Other

## 2013-11-13 ENCOUNTER — Encounter: Payer: Self-pay | Admitting: Family

## 2013-11-13 ENCOUNTER — Encounter (HOSPITAL_COMMUNITY): Payer: Self-pay | Admitting: Emergency Medicine

## 2013-11-13 ENCOUNTER — Emergency Department (HOSPITAL_COMMUNITY)
Admission: EM | Admit: 2013-11-13 | Discharge: 2013-11-13 | Disposition: A | Discharge status: Home / Self Care | Payer: Medicaid Other | Attending: Emergency Medicine | Admitting: Emergency Medicine

## 2013-11-13 ENCOUNTER — Other Ambulatory Visit: Payer: Self-pay | Admitting: Family

## 2013-11-13 DIAGNOSIS — Z872 Personal history of diseases of the skin and subcutaneous tissue: Secondary | ICD-10-CM | POA: Diagnosis not present

## 2013-11-13 DIAGNOSIS — H9202 Otalgia, left ear: Secondary | ICD-10-CM | POA: Insufficient documentation

## 2013-11-13 DIAGNOSIS — K088 Other specified disorders of teeth and supporting structures: Secondary | ICD-10-CM | POA: Diagnosis present

## 2013-11-13 DIAGNOSIS — Z79899 Other long term (current) drug therapy: Secondary | ICD-10-CM | POA: Diagnosis not present

## 2013-11-13 DIAGNOSIS — Z3201 Encounter for pregnancy test, result positive: Secondary | ICD-10-CM

## 2013-11-13 DIAGNOSIS — Z87891 Personal history of nicotine dependence: Secondary | ICD-10-CM | POA: Diagnosis not present

## 2013-11-13 DIAGNOSIS — K0889 Other specified disorders of teeth and supporting structures: Secondary | ICD-10-CM

## 2013-11-13 LAB — HIV ANTIBODY (ROUTINE TESTING W REFLEX): HIV: NONREACTIVE

## 2013-11-13 LAB — GLUCOSE TOLERANCE, 1 HOUR (50G) W/O FASTING: Glucose, 1 Hour GTT: 106 mg/dL (ref 70–140)

## 2013-11-13 LAB — RPR

## 2013-11-13 MED ORDER — ACETAMINOPHEN 500 MG PO TABS: 1000.0000 mg | ORAL_TABLET | Freq: Four times a day (QID) | ORAL | Status: DC | PRN

## 2013-11-13 MED ORDER — PENICILLIN V POTASSIUM 500 MG PO TABS: 500.0000 mg | ORAL_TABLET | Freq: Four times a day (QID) | ORAL | Status: DC

## 2013-11-13 MED ORDER — PRENATAL VITAMINS PLUS 27-1 MG PO TABS: 1.0000 | ORAL_TABLET | Freq: Every day | ORAL | Status: DC

## 2013-11-13 MED ORDER — INTEGRA F 125-1 MG PO CAPS: 1.0000 | ORAL_CAPSULE | Freq: Every day | ORAL | Status: DC

## 2013-11-13 MED ORDER — DOCUSATE SODIUM 100 MG PO CAPS: 100.0000 mg | ORAL_CAPSULE | Freq: Two times a day (BID) | ORAL | Status: DC

## 2013-11-13 NOTE — Progress Notes (Signed)
Pt notified regarding decreased hgb and RX for Integra sent to pharmacy.

## 2013-11-13 NOTE — ED Notes (Signed)
Pt states that she slept over a there friends house last night and the have bugs and felt something in her right ear and now its sore.  Pt also c/o dental pain and states that she has lost her antibiotic that she is supposed to be taking for it.

## 2013-11-13 NOTE — Discharge Instructions (Signed)
Dental Pain A tooth ache may be caused by cavities (tooth decay). Cavities expose the nerve of the tooth to air and hot or cold temperatures. It may come from an infection or abscess (also called a boil or furuncle) around your tooth. It is also often caused by dental caries (tooth decay). This causes the pain you are having. DIAGNOSIS  Your caregiver can diagnose this problem by exam. TREATMENT   If caused by an infection, it may be treated with medications which kill germs (antibiotics) and pain medications as prescribed by your caregiver. Take medications as directed.  Only take over-the-counter or prescription medicines for pain, discomfort, or fever as directed by your caregiver.  Whether the tooth ache today is caused by infection or dental disease, you should see your dentist as soon as possible for further care. SEEK MEDICAL CARE IF: The exam and treatment you received today has been provided on an emergency basis only. This is not a substitute for complete medical or dental care. If your problem worsens or new problems (symptoms) appear, and you are unable to meet with your dentist, call or return to this location. SEEK IMMEDIATE MEDICAL CARE IF:   You have a fever.  You develop redness and swelling of your face, jaw, or neck.  You are unable to open your mouth.  You have severe pain uncontrolled by pain medicine. MAKE SURE YOU:   Understand these instructions.  Will watch your condition.  Will get help right away if you are not doing well or get worse. Document Released: 01/17/2005 Document Revised: 04/11/2011 Document Reviewed: 09/05/2007 [facility] Patient Information 2015 [place]. This information is not intended to replace advice given to you by your health care provider. Make sure you discuss any questions you have with your health care provider.  Otalgia The most common reason for this in children is an infection of the middle ear. Pain from the middle ear is  usually caused by a build-up of fluid and pressure behind the eardrum. Pain from an earache can be sharp, dull, or burning. The pain may be temporary or constant. The middle ear is connected to the nasal passages by a short narrow tube called the Eustachian tube. The Eustachian tube allows fluid to drain out of the middle ear, and helps keep the pressure in your ear equalized. CAUSES  A cold or allergy can block the Eustachian tube with inflammation and the build-up of secretions. This is especially likely in small children, because their Eustachian tube is shorter and more horizontal. When the Eustachian tube closes, the normal flow of fluid from the middle ear is stopped. Fluid can accumulate and cause stuffiness, pain, hearing loss, and an ear infection if germs start growing in this area. SYMPTOMS  The symptoms of an ear infection may include fever, ear pain, fussiness, increased crying, and irritability. Many children will have temporary and minor hearing loss during and right after an ear infection. Permanent hearing loss is rare, but the risk increases the more infections a child has. Other causes of ear pain include retained water in the outer ear canal from swimming and bathing. Ear pain in adults is less likely to be from an ear infection. Ear pain may be referred from other locations. Referred pain may be from the joint between your jaw and the skull. It may also come from a tooth problem or problems in the neck. Other causes of ear pain include:  A foreign body in the ear.  Outer ear infection.  Sinus infections.  Impacted ear wax.  Ear injury.  Arthritis of the jaw or TMJ problems.  Middle ear infection.  Tooth infections.  Sore throat with pain to the ears. DIAGNOSIS  Your caregiver can usually make the diagnosis by examining you. Sometimes other special studies, including x-rays and lab work may be necessary. TREATMENT   If antibiotics were prescribed, use them as directed  and finish them even if you or your child's symptoms seem to be improved.  Sometimes PE tubes are needed in children. These are little plastic tubes which are put into the eardrum during a simple surgical procedure. They allow fluid to drain easier and allow the pressure in the middle ear to equalize. This helps relieve the ear pain caused by pressure changes. HOME CARE INSTRUCTIONS   Only take over-the-counter or prescription medicines for pain, discomfort, or fever as directed by your caregiver. DO NOT GIVE CHILDREN ASPIRIN because of the association of Reye's Syndrome in children taking aspirin.  Use a cold pack applied to the outer ear for 15-20 minutes, 03-04 times per day or as needed may reduce pain. Do not apply ice directly to the skin. You may cause frost bite.  Over-the-counter ear drops used as directed may be effective. Your caregiver may sometimes prescribe ear drops.  Resting in an upright position may help reduce pressure in the middle ear and relieve pain.  Ear pain caused by rapidly descending from high altitudes can be relieved by swallowing or chewing gum. Allowing infants to suck on a bottle during airplane travel can help.  Do not smoke in the house or near children. If you are unable to quit smoking, smoke outside.  Control allergies. SEEK IMMEDIATE MEDICAL CARE IF:   You or your child are becoming sicker.  Pain or fever relief is not obtained with medicine.  You or your child's symptoms (pain, fever, or irritability) do not improve within 24 to 48 hours or as instructed.  Severe pain suddenly stops hurting. This may indicate a ruptured eardrum.  You or your children develop new problems such as severe headaches, stiff neck, difficulty swallowing, or swelling of the face or around the ear. Document Released: 09/04/2003 Document Revised: 04/11/2011 Document Reviewed: 01/09/2008 [facility] Patient Information 2015 [place], [place]. This information is not intended to  replace advice given to you by your health care provider. Make sure you discuss any questions you have with your health care provider.    Emergency Department Resource Guide 1) Find a Doctor and Pay Out of Pocket Although you won't have to find out who is covered by your insurance plan, it is a good idea to ask around and get recommendations. You will then need to call the office and see if the doctor you have chosen will accept you as a new patient and what types of options they offer for patients who are self-pay. Some doctors offer discounts or will set up payment plans for their patients who do not have insurance, but you will need to ask so you aren't surprised when you get to your appointment.  2) Contact Your Local Health Department Not all health departments have doctors that can see patients for sick visits, but many do, so it is worth a call to see if yours does. If you don't know where your local health department is, you can check in your phone book. The CDC also has a tool to help you locate your state's health department, and many state websites also have listings  of all of their local health departments.  3) Find a Walk-in Clinic If your illness is not likely to be very severe or complicated, you may want to try a walk in clinic. These are popping up all over the country in pharmacies, drugstores, and shopping centers. They're usually staffed by nurse practitioners or physician assistants that have been trained to treat common illnesses and complaints. They're usually fairly quick and inexpensive. However, if you have serious medical issues or chronic medical problems, these are probably not your best option.  No Primary Care Doctor: - Call Health Connect at  [phone] - they can help you locate a primary care doctor that  accepts your insurance, provides certain services, etc. - Physician Referral Service- [phone]  Chronic Pain Problems: Organization         Address  Phone    Notes  [facility]  [phone] Patients need to be referred by their primary care doctor.   Medication Assistance: Organization         Address  Phone   Notes  [facility] [address] [place] [place], [place] [address] [phone] --Must be a resident of [facility] -- Must have NO insurance coverage whatsoever (no Medicaid/ Medicare, etc.) -- The pt. MUST have a primary care doctor that directs their care regularly and follows them in the community   [facility]ist  [phone]   [employer]  [phone]    Agencies that provide inexpensive medical care: Organization         Address  Phone   Notes  [facility]  [phone]   [facility]    [phone]   [facility] [address] [place], [place] [address] [phone]   [facility] [address] [place], [place] [phone]   Planned Parenthood    [phone]   [facility]    [phone]   Community Health and [facility]  [address], [place] Phone:  [phone], Fax:  [phone] Hours of Operation:  9 am - 6 pm, M-F.  Also accepts Medicaid/Medicare and self-pay.  [facility]  [address], [place] Phone: [phone], Fax: [phone]. Hours of Operation:  8:30 am - 5:30 pm, M-F.  Also accepts Medicaid and self-pay.  [facility] [place] [address], [place] Phone: [phone]   [facility] [address] Natasha Bence [place], [place] [phone], Ext. [phone] Mondays & Thursdays: 7-9 AM.  First 15 patients are seen on a first come, first serve basis.    Medicaid-accepting [facility] Providers:  Organization         Address  Phone   Notes  [facility] [address], [place] [phone] Also accepts self-pay patients.  [facility]  [address] Laurell Josephs [place], [place]  [phone]   [facility] [address], [place] [phone]   [facility] [address] [place] [phone]   Renaye Rakers [address], [place]   [phone] Only accepts [place] patients after they have their name applied to their card.   Self-Pay (no insurance) in [facility]:  Retail buyer   Notes  Sickle Cell Patients, [facility] [address] [place], [place] [phone]   [facility] [address] [place], [place] [phone]   [facility] [place]  [address], [place] [phone]   [facility]/Dr. Osei-Bonsu  [address], [place] or [address], [place] [phone] Phone number for both [place] and [place] locations is the same.  [facility]gent [facility] and [facility] [address] [place] [phone]   [facility] [address], [place] or [address] [phone]   [facility] [address] [place] [phone], phone; [phone], fax Sees patients 1st and 3rd Saturday of every month.  Must not qualify for public or private insurance (i.e. Medicaid, Medicare, [place] Health Choice, Veterans' Benefits)  Household income should be no more than 200% of the poverty level The clinic cannot treat you if you are pregnant or think you are pregnant  Sexually transmitted diseases are not treated at the clinic.    Dental Care: Organization         Address  Phone  Notes  [facility] Department of [facility] [address] [place], [place] [phone] Accepts children up to age 46 who are enrolled in [place] or [place] Health Choice; pregnant women with a Medicaid card; and children who have  applied for Medicaid or [place] Health Choice, but were declined, whose parents can pay a reduced fee at time of service.  [facility] of [facility]  [address], [place] [phone] Accepts children up to age 52 who are enrolled in [place] or [place] Health Choice; pregnant women with a Medicaid card; and children who have applied for Medicaid or  Health Choice, but were declined, whose parents can pay a reduced fee at time of service.  [facility]  [address] [place], [place] [phone] Patients are seen by appointment only. Walk-ins are not accepted. [facility] will see patients 50 years of age and older. Monday - Tuesday (8am-5pm) Most Wednesdays (8:30-5pm) $30 per visit, cash only  [employer] Adult Dental Access [facility]  [address], [facility] [phone] Patients are seen by appointment only. Walk-ins are not accepted. [facility] will see patients 70 years of age and older. One Wednesday Evening (Monthly: Volunteer Based).  $30 per visit, cash only  [employer]  [phone] for adults; Children under age 48, call Graduate Pediatric Dentistry at [phone]. Children aged 52-14, please call [phone] to request a pediatric application.  Dental services are provided in all areas of dental care including fillings, crowns and bridges, complete and partial dentures, implants, gum treatment, root canals, and extractions. Preventive care is also provided. Treatment is provided to both adults and children. Patients are selected via a lottery and there is often a waiting list.   [facility] [address] [place]  [phone] [email or web address]   [facility] [address] [place], [place] [phone], Ext. [phone] Second and Fourth Thursday of each month, opens at 6:30 AM; Clinic ends at 9 AM.  Patients are seen on a first-come first-served basis, and a  limited number are seen during each clinic.   [facility]  [address] Ether Griffins [place], [place] [phone]   Eligibility  Requirements You must have lived in [place], [place], or [place] counties for at least the last three months.   You cannot be eligible for state or federal sponsored [place]healthcare insurance, including CIG[employer]eterans Administration, [place], or [employer]icare.   You generally cannot be eligible for healthcare insurance through your employer.    How to apply: Eligibility screenings are held every Tuesday and Wednesday afternoon from 1:00 pm until 4:00 pm. You do not need an appointment for the interview!  [facility] [address], [place], [place] [phone]   [facility]  [phone]   [facility]  [phone]   [facility]  [phone]    Behavioral Health Resources in the Community: Intensive Outpatient Programs Organization         Address  Phone  Notes  [facility] [address], [place], [place] [phone]   [facility] [address], [place], [place] [phone]   [facility] [address], [place], [place]  [phone]   [facility] [address],  [place], [place] [phone] or [phone]   Substance Abuse Resources Organization         Address  Phone  Notes  [facility]  [phone]   [facility]  [phone]   [facility]  [phone]   [facility]  [phone]   Residential & Outpatient Substance Abuse Program  [phone]   Psychological Services Organization         Address  Phone  Notes  [facility]  [phone]- [phone]   [facility]  [phone]   [facility] [address], [place] [phone] or [phone]    Mobile Crisis Teams Organization          Address  Phone  Notes  [facility], Mobile Crisis Care Unit  [phone]   [facility]  [address] [place], [place] [phone]   Doristine LocksSharon DeEsch [address] [place] [phone]    Self-Help/Support Groups Organization         Address  Phone             Notes  [facility] [place] - variety of support groups  [phone] Call for more information  Narcotics Anonymous (NA), [facility] [address], [place]  2 meetings at this location   Statisticianesidential Treatment Programs Organization         Address  Phone  Notes  [facility] [address],    [place]  [phone]   [facility]  [address], [place] [phone], [place], [place] [phone]   [facility] [address], [place] [phone] Admissions: 8am-3pm M-F  [facility] [address]    [place], [place] [phone]   [facility] [address], [place], [place] [phone]   [facility] [address]  [place], [place] [phone]   [facility] - Intensive Outpatient [address], [place], [place] [phone]   [facility] ([facility] [address]  [place], [place] [phone] or [phone]   [facility] ([facility]) [address] [place], [place] [phone] Accepts Medicaid  [facility] [address]  [place] [phone] Substance Abuse/Addiction Treatment   [facility] Resources Organization  Address  Phone  Notes  [facility]  [phone]   Domenic Schwab, PhD [address] Arlis Porta [place], [place]   [phone] or [phone]   [place], [place] [phone]   [address], [place], [place] [phone] Insurance/Medicaid/sponsorship  through [facility] and Families [address]                                    [place], [place] [phone] [place] [address], [place] [phone]    Dr. Adele Schilder  [phone]   [facility] 1) [address], [place] 2) [place] 3)  [place] [address], [place] [phone]  [phone]   [place] [phone] or [phone] (After Hours)

## 2013-11-13 NOTE — ED Provider Notes (Signed)
TIME SEEN: 9:25 AM  CHIEF COMPLAINT: Left ear pain, dental pain  HPI: Patient is a 33 y.o. F [id number] who is [redacted] weeks pregnant who presents to the emergency department complaints of left ear pain and dental pain. She reports she has multiple fractured teeth and has a dentist who is older she needs an extraction but given she is pregnant they do not want to perform this at this time as they feel that she will need sedation. She denies any facial swelling, trismus or drooling, fevers or chills. She states that last night she stated a friend's house and felt like something crawled into her left ear and has had pain since. She states she put a Q-tip in her ear trying to get something out without relief. No hearing loss or tinnitus. No drainage. She is feeling her fetus move, no contractions or abdominal pain, no vaginal bleeding or leaking fluid. She was seen on 10/19/13 for dental pain and was given penicillin prophylactically and she states she lost his prescription and never had it filled.  ROS: See HPI Constitutional: no fever  Eyes: no drainage  ENT: no runny nose   Cardiovascular:  no chest pain  Resp: no SOB  GI: no vomiting GU: no dysuria Integumentary: no rash  Allergy: no hives  Musculoskeletal: no leg swelling  Neurological: no slurred speech ROS otherwise negative  PAST MEDICAL HISTORY/PAST SURGICAL HISTORY:  Past Medical History  Diagnosis Date  . No pertinent past medical history   . Shortness of breath   . Ulcer     MEDICATIONS:  Prior to Admission medications   Medication Sig Start Date End Date Taking? Authorizing Provider  acetaminophen (TYLENOL) 500 MG tablet Take 500 mg by mouth every 6 (six) hours as needed for mild pain or moderate pain.    Yes Historical Provider, MD  desonide (DESOWEN) 0.05 % cream Apply topically 2 (two) times daily. 09/24/13  Yes Marlis EdelsonWalidah N Karim, CNM  Prenatal Vit-Fe Fumarate-FA (PRENATAL VITAMINS PLUS) 27-1 MG TABS Take 1 tablet by mouth daily.  06/18/13  Yes Allie BossierMyra C Dove, MD    ALLERGIES:  No Known Allergies  SOCIAL HISTORY:  History  Substance Use Topics  . Smoking status: Former Smoker    Types: Cigarettes  . Smokeless tobacco: Never Used  . Alcohol Use: No    FAMILY HISTORY: Family History  Problem Relation Age of Onset  . Anesthesia problems Neg Hx   . Diabetes Father     EXAM: BP 137/72  Pulse 87  Temp(Src) 98.3 F (36.8 C) (Oral)  Resp 19  SpO2 99%  LMP 05/08/2013 CONSTITUTIONAL: Alert and oriented and responds appropriately to questions. Well-appearing; well-nourished, smiling, pleasant, no distress HEAD: Normocephalic EYES: Conjunctivae clear, PERRL ENT: normal nose; no rhinorrhea; moist mucous membranes; pharynx without lesions noted; multiple fractured teeth, no sign of dental abscess, no gingival erythema or swelling or fluctuance, no trismus or drooling, no muffled voice, no uvular deviation, no tonsillar hypertrophy or exudate, her TMs are clear bilaterally, there is a small abrasion in the left external auditory canal, no drainage or ears, no redness or tenderness or warmth over the mastoid processes bilaterally NECK: Supple, no meningismus, no LAD  CARD: RRR; S1 and S2 appreciated; no murmurs, no clicks, no rubs, no gallops RESP: Normal chest excursion without splinting or tachypnea; breath sounds clear and equal bilaterally; no wheezes, no rhonchi, no rales,  ABD/GI: Normal bowel sounds; non-distended; soft, non-tender, no rebound, no guarding, gravid uterus BACK:  The  back appears normal and is non-tender to palpation, there is no CVA tenderness EXT: Normal ROM in all joints; non-tender to palpation; no edema; normal capillary refill; no cyanosis    SKIN: Normal color for age and race; warm NEURO: Moves all extremities equally PSYCH: The patient's mood and manner are appropriate. Grooming and personal hygiene are appropriate.  MEDICAL DECISION MAKING: Patient here with complaints of dental pain  secondary to dental fractures. No sign of infection on exam. Will treat with penicillin prophylactically. She states she has a dentist for followup but states they have told her she needs multiple teeth extracted but they do not want to do this at this time she is pregnant. She reports she will take Tylenol for pain. She is also complaining of left ear pain and has a small abrasion in the external auditory canal but no sign of otitis media, foreign body in the left ear, mastoiditis. Discussed return precautions. Have advised her not to put anything else into her ear as her Q-tip may have caused this abrasion. There is no sign of TM perforation. She is well-appearing, nontoxic, pleasant. We will discharge home.  Discussed return precautions.       Layla MawKristen N Ward, DO 11/13/13 1000

## 2013-11-13 NOTE — Progress Notes (Signed)
  CARE MANAGEMENT ED NOTE 11/13/2013  Patient:  Elizabeth Mendoza,Elizabeth Mendoza   Account Number:  [id number]  Date Initiated:  11/13/2013  Documentation initiated by:  Edd ArbourGIBBS,KIMBERLY  Subjective/Objective Assessment:   33 [place],  states that she slept over a there friends house last night and the have bugs and felt something in her right ear and now its sore. Pt also c/o dental pain and states that she has lost her ABX     Subjective/Objective Assessment Detail:   [place] States she wants to change to another medicaid provider Pt with 2 ED visits in last 6 months     Action/Plan:   confirmed pcp is dr Concepcion Elkavbuere Provided with [place]   Action/Plan Detail:   Anticipated DC Date:  11/13/2013     Status Recommendation to Physician:   Result of Recommendation:    Other ED Services  Consult Working Plan    DC Planning Services  Other  Outpatient Services - Pt will follow up  PCP issues    Choice offered to / List presented to:            Status of service:  Completed, signed off  ED Comments:   ED Comments Detail:  Pt encouraged to Please contact the Dept of Social services case worker to have a local medicaid accepting primary care doctor entered pror to going to one from list

## 2013-11-26 ENCOUNTER — Encounter: Payer: Medicaid Other | Admitting: Family

## 2013-11-26 ENCOUNTER — Encounter: Payer: Self-pay | Admitting: Family Medicine

## 2013-11-29 ENCOUNTER — Encounter: Payer: Self-pay | Admitting: General Practice

## 2013-12-02 ENCOUNTER — Encounter (HOSPITAL_COMMUNITY): Payer: Self-pay | Admitting: Emergency Medicine

## 2013-12-20 ENCOUNTER — Ambulatory Visit (INDEPENDENT_AMBULATORY_CARE_PROVIDER_SITE_OTHER): Payer: Medicaid Other | Admitting: Advanced Practice Midwife

## 2013-12-20 ENCOUNTER — Encounter: Payer: Self-pay | Admitting: Advanced Practice Midwife

## 2013-12-20 VITALS — BP 102/69 | HR 97 | Temp 98.1°F | Wt 215.2 lb

## 2013-12-20 DIAGNOSIS — L309 Dermatitis, unspecified: Secondary | ICD-10-CM | POA: Insufficient documentation

## 2013-12-20 DIAGNOSIS — Z3483 Encounter for supervision of other normal pregnancy, third trimester: Secondary | ICD-10-CM

## 2013-12-20 DIAGNOSIS — Z3493 Encounter for supervision of normal pregnancy, unspecified, third trimester: Secondary | ICD-10-CM

## 2013-12-20 LAB — POCT URINALYSIS DIP (DEVICE)
BILIRUBIN URINE: NEGATIVE
Glucose, UA: NEGATIVE mg/dL
HGB URINE DIPSTICK: NEGATIVE
Ketones, ur: NEGATIVE mg/dL
NITRITE: NEGATIVE
Protein, ur: NEGATIVE mg/dL
Specific Gravity, Urine: 1.025 (ref 1.005–1.030)
Urobilinogen, UA: 1 mg/dL (ref 0.0–1.0)
pH: 7 (ref 5.0–8.0)

## 2013-12-20 MED ORDER — DESONIDE 0.05 % EX CREA: TOPICAL_CREAM | Freq: Two times a day (BID) | CUTANEOUS | Status: DC

## 2013-12-20 NOTE — Progress Notes (Signed)
Patient reports pelvic and rectal pressure; also reports that she feels like the baby is moving less because she is not able to eat or drink that much and doesn't really have an appetite

## 2013-12-20 NOTE — Progress Notes (Signed)
Reviewed normal glucola. Has not signed BTL papers yet. Will get them signed today. States has trouble drinking milk. Discussed she does not have to drink that much, can get calcium from other sources (discussed). More important to drink water.

## 2013-12-20 NOTE — Patient Instructions (Signed)
Third Trimester of Pregnancy The third trimester is from week 29 through week 42, months 7 through 9. The third trimester is a time when the fetus is growing rapidly. At the end of the ninth month, the fetus is about 20 inches in length and weighs 6-10 pounds.  BODY CHANGES Your body goes through many changes during pregnancy. The changes vary from woman to woman.   Your weight will continue to increase. You can expect to gain 25-35 pounds (11-16 kg) by the end of the pregnancy.  You may begin to get stretch marks on your hips, abdomen, and breasts.  You may urinate more often because the fetus is moving lower into your pelvis and pressing on your bladder.  You may develop or continue to have heartburn as a result of your pregnancy.  You may develop constipation because certain hormones are causing the muscles that push waste through your intestines to slow down.  You may develop hemorrhoids or swollen, bulging veins (varicose veins).  You may have pelvic pain because of the weight gain and pregnancy hormones relaxing your joints between the bones in your pelvis. Backaches may result from overexertion of the muscles supporting your posture.  You may have changes in your hair. These can include thickening of your hair, rapid growth, and changes in texture. Some women also have hair loss during or after pregnancy, or hair that feels dry or thin. Your hair will most likely return to normal after your baby is born.  Your breasts will continue to grow and be tender. A yellow discharge may leak from your breasts called colostrum.  Your belly button may stick out.  You may feel short of breath because of your expanding uterus.  You may notice the fetus "dropping," or moving lower in your abdomen.  You may have a bloody mucus discharge. This usually occurs a few days to a week before labor begins.  Your cervix becomes thin and soft (effaced) near your due date. WHAT TO EXPECT AT YOUR PRENATAL  EXAMS  You will have prenatal exams every 2 weeks until week 36. Then, you will have weekly prenatal exams. During a routine prenatal visit:  You will be weighed to make sure you and the fetus are growing normally.  Your blood pressure is taken.  Your abdomen will be measured to track your baby's growth.  The fetal heartbeat will be listened to.  Any test results from the previous visit will be discussed.  You may have a cervical check near your due date to see if you have effaced. At around 36 weeks, your caregiver will check your cervix. At the same time, your caregiver will also perform a test on the secretions of the vaginal tissue. This test is to determine if a type of bacteria, Group B streptococcus, is present. Your caregiver will explain this further. Your caregiver may ask you:  What your birth plan is.  How you are feeling.  If you are feeling the baby move.  If you have had any abnormal symptoms, such as leaking fluid, bleeding, severe headaches, or abdominal cramping.  If you have any questions. Other tests or screenings that may be performed during your third trimester include:  Blood tests that check for low iron levels (anemia).  Fetal testing to check the health, activity level, and growth of the fetus. Testing is done if you have certain medical conditions or if there are problems during the pregnancy. FALSE LABOR You may feel small, irregular contractions that   eventually go away. These are called Braxton Hicks contractions, or false labor. Contractions may last for hours, days, or even weeks before true labor sets in. If contractions come at regular intervals, intensify, or become painful, it is best to be seen by your caregiver.  SIGNS OF LABOR   Menstrual-like cramps.  Contractions that are 5 minutes apart or less.  Contractions that start on the top of the uterus and spread down to the lower abdomen and back.  A sense of increased pelvic pressure or back  pain.  A watery or bloody mucus discharge that comes from the vagina. If you have any of these signs before the 37th week of pregnancy, call your caregiver right away. You need to go to the hospital to get checked immediately. HOME CARE INSTRUCTIONS   Avoid all smoking, herbs, alcohol, and unprescribed drugs. These chemicals affect the formation and growth of the baby.  Follow your caregiver's instructions regarding medicine use. There are medicines that are either safe or unsafe to take during pregnancy.  Exercise only as directed by your caregiver. Experiencing uterine cramps is a good sign to stop exercising.  Continue to eat regular, healthy meals.  Wear a good support bra for breast tenderness.  Do not use hot tubs, steam rooms, or saunas.  Wear your seat belt at all times when driving.  Avoid raw meat, uncooked cheese, cat litter boxes, and soil used by cats. These carry germs that can cause birth defects in the baby.  Take your prenatal vitamins.  Try taking a stool softener (if your caregiver approves) if you develop constipation. Eat more high-fiber foods, such as fresh vegetables or fruit and whole grains. Drink plenty of fluids to keep your urine clear or pale yellow.  Take warm sitz baths to soothe any pain or discomfort caused by hemorrhoids. Use hemorrhoid cream if your caregiver approves.  If you develop varicose veins, wear support hose. Elevate your feet for 15 minutes, 3-4 times a day. Limit salt in your diet.  Avoid heavy lifting, wear low heal shoes, and practice good posture.  Rest a lot with your legs elevated if you have leg cramps or low back pain.  Visit your dentist if you have not gone during your pregnancy. Use a soft toothbrush to brush your teeth and be gentle when you floss.  A sexual relationship may be continued unless your caregiver directs you otherwise.  Do not travel far distances unless it is absolutely necessary and only with the approval  of your caregiver.  Take prenatal classes to understand, practice, and ask questions about the labor and delivery.  Make a trial run to the hospital.  Pack your hospital bag.  Prepare the baby's nursery.  Continue to go to all your prenatal visits as directed by your caregiver. SEEK MEDICAL CARE IF:  You are unsure if you are in labor or if your water has broken.  You have dizziness.  You have mild pelvic cramps, pelvic pressure, or nagging pain in your abdominal area.  You have persistent nausea, vomiting, or diarrhea.  You have a bad smelling vaginal discharge.  You have pain with urination. SEEK IMMEDIATE MEDICAL CARE IF:   You have a fever.  You are leaking fluid from your vagina.  You have spotting or bleeding from your vagina.  You have severe abdominal cramping or pain.  You have rapid weight loss or gain.  You have shortness of breath with chest pain.  You notice sudden or extreme swelling   of your face, hands, ankles, feet, or legs.  You have not felt your baby move in over an hour.  You have severe headaches that do not go away with medicine.  You have vision changes. Document Released: 01/11/2001 Document Revised: 01/22/2013 Document Reviewed: 03/20/2012 [employer] Patient Information 2015 [employer]. This information is not intended to replace advice given to you by your health care provider. Make sure you discuss any questions you have with your health care provider.  

## 2013-12-20 NOTE — Progress Notes (Signed)
BTL papers signed. Copy given to patient and original in scan folder.

## 2014-01-01 ENCOUNTER — Encounter: Payer: Self-pay | Admitting: *Deleted

## 2014-01-07 ENCOUNTER — Ambulatory Visit (INDEPENDENT_AMBULATORY_CARE_PROVIDER_SITE_OTHER): Payer: Medicaid Other | Admitting: Obstetrics & Gynecology

## 2014-01-07 VITALS — BP 106/66 | HR 110 | Temp 97.6°F | Wt 221.5 lb

## 2014-01-07 DIAGNOSIS — Z3483 Encounter for supervision of other normal pregnancy, third trimester: Secondary | ICD-10-CM

## 2014-01-07 LAB — POCT URINALYSIS DIP (DEVICE)
BILIRUBIN URINE: NEGATIVE
GLUCOSE, UA: NEGATIVE mg/dL
Hgb urine dipstick: NEGATIVE
Ketones, ur: NEGATIVE mg/dL
Nitrite: NEGATIVE
PH: 6 (ref 5.0–8.0)
Protein, ur: NEGATIVE mg/dL
Specific Gravity, Urine: 1.02 (ref 1.005–1.030)
Urobilinogen, UA: 2 mg/dL — ABNORMAL HIGH (ref 0.0–1.0)

## 2014-01-07 NOTE — Patient Instructions (Signed)
Third Trimester of Pregnancy The third trimester is from week 29 through week 42, months 7 through 9. The third trimester is a time when the fetus is growing rapidly. At the end of the ninth month, the fetus is about 20 inches in length and weighs 6-10 pounds.  BODY CHANGES Your body goes through many changes during pregnancy. The changes vary from woman to woman.   Your weight will continue to increase. You can expect to gain 25-35 pounds (11-16 kg) by the end of the pregnancy.  You may begin to get stretch marks on your hips, abdomen, and breasts.  You may urinate more often because the fetus is moving lower into your pelvis and pressing on your bladder.  You may develop or continue to have heartburn as a result of your pregnancy.  You may develop constipation because certain hormones are causing the muscles that push waste through your intestines to slow down.  You may develop hemorrhoids or swollen, bulging veins (varicose veins).  You may have pelvic pain because of the weight gain and pregnancy hormones relaxing your joints between the bones in your pelvis. Backaches may result from overexertion of the muscles supporting your posture.  You may have changes in your hair. These can include thickening of your hair, rapid growth, and changes in texture. Some women also have hair loss during or after pregnancy, or hair that feels dry or thin. Your hair will most likely return to normal after your baby is born.  Your breasts will continue to grow and be tender. A yellow discharge may leak from your breasts called colostrum.  Your belly button may stick out.  You may feel short of breath because of your expanding uterus.  You may notice the fetus "dropping," or moving lower in your abdomen.  You may have a bloody mucus discharge. This usually occurs a few days to a week before labor begins.  Your cervix becomes thin and soft (effaced) near your due date. WHAT TO EXPECT AT YOUR PRENATAL  EXAMS  You will have prenatal exams every 2 weeks until week 36. Then, you will have weekly prenatal exams. During a routine prenatal visit:  You will be weighed to make sure you and the fetus are growing normally.  Your blood pressure is taken.  Your abdomen will be measured to track your baby's growth.  The fetal heartbeat will be listened to.  Any test results from the previous visit will be discussed.  You may have a cervical check near your due date to see if you have effaced. At around 36 weeks, your caregiver will check your cervix. At the same time, your caregiver will also perform a test on the secretions of the vaginal tissue. This test is to determine if a type of bacteria, Group B streptococcus, is present. Your caregiver will explain this further. Your caregiver may ask you:  What your birth plan is.  How you are feeling.  If you are feeling the baby move.  If you have had any abnormal symptoms, such as leaking fluid, bleeding, severe headaches, or abdominal cramping.  If you have any questions. Other tests or screenings that may be performed during your third trimester include:  Blood tests that check for low iron levels (anemia).  Fetal testing to check the health, activity level, and growth of the fetus. Testing is done if you have certain medical conditions or if there are problems during the pregnancy. FALSE LABOR You may feel small, irregular contractions that   eventually go away. These are called Braxton Hicks contractions, or false labor. Contractions may last for hours, days, or even weeks before true labor sets in. If contractions come at regular intervals, intensify, or become painful, it is best to be seen by your caregiver.  SIGNS OF LABOR   Menstrual-like cramps.  Contractions that are 5 minutes apart or less.  Contractions that start on the top of the uterus and spread down to the lower abdomen and back.  A sense of increased pelvic pressure or back  pain.  A watery or bloody mucus discharge that comes from the vagina. If you have any of these signs before the 37th week of pregnancy, call your caregiver right away. You need to go to the hospital to get checked immediately. HOME CARE INSTRUCTIONS   Avoid all smoking, herbs, alcohol, and unprescribed drugs. These chemicals affect the formation and growth of the baby.  Follow your caregiver's instructions regarding medicine use. There are medicines that are either safe or unsafe to take during pregnancy.  Exercise only as directed by your caregiver. Experiencing uterine cramps is a good sign to stop exercising.  Continue to eat regular, healthy meals.  Wear a good support bra for breast tenderness.  Do not use hot tubs, steam rooms, or saunas.  Wear your seat belt at all times when driving.  Avoid raw meat, uncooked cheese, cat litter boxes, and soil used by cats. These carry germs that can cause birth defects in the baby.  Take your prenatal vitamins.  Try taking a stool softener (if your caregiver approves) if you develop constipation. Eat more high-fiber foods, such as fresh vegetables or fruit and whole grains. Drink plenty of fluids to keep your urine clear or pale yellow.  Take warm sitz baths to soothe any pain or discomfort caused by hemorrhoids. Use hemorrhoid cream if your caregiver approves.  If you develop varicose veins, wear support hose. Elevate your feet for 15 minutes, 3-4 times a day. Limit salt in your diet.  Avoid heavy lifting, wear low heal shoes, and practice good posture.  Rest a lot with your legs elevated if you have leg cramps or low back pain.  Visit your dentist if you have not gone during your pregnancy. Use a soft toothbrush to brush your teeth and be gentle when you floss.  A sexual relationship may be continued unless your caregiver directs you otherwise.  Do not travel far distances unless it is absolutely necessary and only with the approval  of your caregiver.  Take prenatal classes to understand, practice, and ask questions about the labor and delivery.  Make a trial run to the hospital.  Pack your hospital bag.  Prepare the baby's nursery.  Continue to go to all your prenatal visits as directed by your caregiver. SEEK MEDICAL CARE IF:  You are unsure if you are in labor or if your water has broken.  You have dizziness.  You have mild pelvic cramps, pelvic pressure, or nagging pain in your abdominal area.  You have persistent nausea, vomiting, or diarrhea.  You have a bad smelling vaginal discharge.  You have pain with urination. SEEK IMMEDIATE MEDICAL CARE IF:   You have a fever.  You are leaking fluid from your vagina.  You have spotting or bleeding from your vagina.  You have severe abdominal cramping or pain.  You have rapid weight loss or gain.  You have shortness of breath with chest pain.  You notice sudden or extreme swelling   of your face, hands, ankles, feet, or legs.  You have not felt your baby move in over an hour.  You have severe headaches that do not go away with medicine.  You have vision changes. Document Released: 01/11/2001 Document Revised: 01/22/2013 Document Reviewed: 03/20/2012 [employer] Patient Information 2015 [employer]. This information is not intended to replace advice given to you by your health care provider. Make sure you discuss any questions you have with your health care provider.  

## 2014-01-07 NOTE — Progress Notes (Signed)
C/o of intermittent pelvic pain with pressure especially when walking.

## 2014-01-07 NOTE — Progress Notes (Signed)
Pressure and occasional UC no LOF VB.

## 2014-01-12 ENCOUNTER — Inpatient Hospital Stay (HOSPITAL_COMMUNITY)
Admission: AD | Admit: 2014-01-12 | Discharge: 2014-01-12 | Disposition: A | Discharge status: Home / Self Care | Payer: Medicaid Other | Source: Ambulatory Visit | Attending: Obstetrics and Gynecology | Admitting: Obstetrics and Gynecology

## 2014-01-12 ENCOUNTER — Encounter (HOSPITAL_COMMUNITY): Payer: Self-pay | Admitting: *Deleted

## 2014-01-12 DIAGNOSIS — R109 Unspecified abdominal pain: Secondary | ICD-10-CM | POA: Insufficient documentation

## 2014-01-12 DIAGNOSIS — Z3483 Encounter for supervision of other normal pregnancy, third trimester: Secondary | ICD-10-CM

## 2014-01-12 DIAGNOSIS — O99613 Diseases of the digestive system complicating pregnancy, third trimester: Secondary | ICD-10-CM | POA: Insufficient documentation

## 2014-01-12 DIAGNOSIS — K429 Umbilical hernia without obstruction or gangrene: Secondary | ICD-10-CM | POA: Insufficient documentation

## 2014-01-12 DIAGNOSIS — Z87891 Personal history of nicotine dependence: Secondary | ICD-10-CM | POA: Insufficient documentation

## 2014-01-12 DIAGNOSIS — Z3A34 34 weeks gestation of pregnancy: Secondary | ICD-10-CM | POA: Insufficient documentation

## 2014-01-12 LAB — URINALYSIS, ROUTINE W REFLEX MICROSCOPIC
BILIRUBIN URINE: NEGATIVE
Glucose, UA: NEGATIVE mg/dL
Hgb urine dipstick: NEGATIVE
Ketones, ur: 15 mg/dL — AB
LEUKOCYTES UA: NEGATIVE
NITRITE: NEGATIVE
PH: 6 (ref 5.0–8.0)
Protein, ur: NEGATIVE mg/dL
Urobilinogen, UA: 2 mg/dL — ABNORMAL HIGH (ref 0.0–1.0)

## 2014-01-12 NOTE — Discharge Instructions (Signed)
Braxton Hicks Contractions °Contractions of the uterus can occur throughout pregnancy. Contractions are not always a sign that you are in labor.  °WHAT ARE BRAXTON HICKS CONTRACTIONS?  °Contractions that occur before labor are called Braxton Hicks contractions, or false labor. Toward the end of pregnancy (32-34 weeks), these contractions can develop more often and may become more forceful. This is not true labor because these contractions do not result in opening (dilatation) and thinning of the cervix. They are sometimes difficult to tell apart from true labor because these contractions can be forceful and people have different pain tolerances. You should not feel embarrassed if you go to the hospital with false labor. Sometimes, the only way to tell if you are in true labor is for your health care provider to look for changes in the cervix. °If there are no prenatal problems or other health problems associated with the pregnancy, it is completely safe to be sent home with false labor and await the onset of true labor. °HOW CAN YOU TELL THE DIFFERENCE BETWEEN TRUE AND FALSE LABOR? °False Labor °· The contractions of false labor are usually shorter and not as hard as those of true labor.   °· The contractions are usually irregular.   °· The contractions are often felt in the front of the lower abdomen and in the groin.   °· The contractions may go away when you walk around or change positions while lying down.   °· The contractions get weaker and are shorter lasting as time goes on.   °· The contractions do not usually become progressively stronger, regular, and closer together as with true labor.   °True Labor °1. Contractions in true labor last 30-70 seconds, become very regular, usually become more intense, and increase in frequency.   °2. The contractions do not go away with walking.   °3. The discomfort is usually felt in the top of the uterus and spreads to the lower abdomen and low back.   °4. True labor can  be determined by your health care provider with an exam. This will show that the cervix is dilating and getting thinner.   °WHAT TO REMEMBER °· Keep up with your usual exercises and follow other instructions given by your health care provider.   °· Take medicines as directed by your health care provider.   °· Keep your regular prenatal appointments.   °· Eat and drink lightly if you think you are going into labor.   °· If Braxton Hicks contractions are making you uncomfortable:   °· Change your position from lying down or resting to walking, or from walking to resting.   °· Sit and rest in a tub of warm water.   °· Drink 2-3 glasses of water. Dehydration may cause these contractions.   °· Do slow and deep breathing several times an hour.   °WHEN SHOULD I SEEK IMMEDIATE MEDICAL CARE? °Seek immediate medical care if: °· Your contractions become stronger, more regular, and closer together.   °· You have fluid leaking or gushing from your vagina.   °· You have a fever.   °· You pass blood-tinged mucus.   °· You have vaginal bleeding.   °· You have continuous abdominal pain.   °· You have low back pain that you never had before.   °· You feel your baby's head pushing down and causing pelvic pressure.   °· Your baby is not moving as much as it used to.   °Document Released: 01/17/2005 Document Revised: 01/22/2013 Document Reviewed: 10/29/2012 °[employer]® Patient Information ©2015 [employer]. This information is not intended to replace advice given to you by your health care   provider. Make sure you discuss any questions you have with your health care provider. ° °Fetal Movement Counts °Patient Name: __________________________________________________ Patient Due Date: ____________________ °Performing a fetal movement count is highly recommended in high-risk pregnancies, but it is good for every pregnant woman to do. Your health care provider may ask you to start counting fetal movements at 28 weeks of the pregnancy. Fetal  movements often increase: °· After eating a full meal. °· After physical activity. °· After eating or drinking something sweet or cold. °· At rest. °Pay attention to when you feel the baby is most active. This will help you notice a pattern of your baby's sleep and wake cycles and what factors contribute to an increase in fetal movement. It is important to perform a fetal movement count at the same time each day when your baby is normally most active.  °HOW TO COUNT FETAL MOVEMENTS °5. Find a quiet and comfortable area to sit or lie down on your left side. Lying on your left side provides the best blood and oxygen circulation to your baby. °6. Write down the day and time on a sheet of paper or in a journal. °7. Start counting kicks, flutters, swishes, rolls, or jabs in a 2-hour period. You should feel at least 10 movements within 2 hours. °8. If you do not feel 10 movements in 2 hours, wait 2-3 hours and count again. Look for a change in the pattern or not enough counts in 2 hours. °SEEK MEDICAL CARE IF: °· You feel less than 10 counts in 2 hours, tried twice. °· There is no movement in over an hour. °· The pattern is changing or taking longer each day to reach 10 counts in 2 hours. °· You feel the baby is not moving as he or she usually does. °Date: ____________ Movements: ____________ Start time: ____________ Finish time: ____________  °Date: ____________ Movements: ____________ Start time: ____________ Finish time: ____________ °Date: ____________ Movements: ____________ Start time: ____________ Finish time: ____________ °Date: ____________ Movements: ____________ Start time: ____________ Finish time: ____________ °Date: ____________ Movements: ____________ Start time: ____________ Finish time: ____________ °Date: ____________ Movements: ____________ Start time: ____________ Finish time: ____________ °Date: ____________ Movements: ____________ Start time: ____________ Finish time: ____________ °Date: ____________  Movements: ____________ Start time: ____________ Finish time: ____________  °Date: ____________ Movements: ____________ Start time: ____________ Finish time: ____________ °Date: ____________ Movements: ____________ Start time: ____________ Finish time: ____________ °Date: ____________ Movements: ____________ Start time: ____________ Finish time: ____________ °Date: ____________ Movements: ____________ Start time: ____________ Finish time: ____________ °Date: ____________ Movements: ____________ Start time: ____________ Finish time: ____________ °Date: ____________ Movements: ____________ Start time: ____________ Finish time: ____________ °Date: ____________ Movements: ____________ Start time: ____________ Finish time: ____________  °Date: ____________ Movements: ____________ Start time: ____________ Finish time: ____________ °Date: ____________ Movements: ____________ Start time: ____________ Finish time: ____________ °Date: ____________ Movements: ____________ Start time: ____________ Finish time: ____________ °Date: ____________ Movements: ____________ Start time: ____________ Finish time: ____________ °Date: ____________ Movements: ____________ Start time: ____________ Finish time: ____________ °Date: ____________ Movements: ____________ Start time: ____________ Finish time: ____________ °Date: ____________ Movements: ____________ Start time: ____________ Finish time: ____________  °Date: ____________ Movements: ____________ Start time: ____________ Finish time: ____________ °Date: ____________ Movements: ____________ Start time: ____________ Finish time: ____________ °Date: ____________ Movements: ____________ Start time: ____________ Finish time: ____________ °Date: ____________ Movements: ____________ Start time: ____________ Finish time: ____________ °Date: ____________ Movements: ____________ Start time: ____________ Finish time: ____________ °Date: ____________ Movements: ____________ Start time:  ____________ Finish time: ____________ °Date: ____________ Movements:   ____________ Start time: ____________ Finish time: ____________  °Date: ____________ Movements: ____________ Start time: ____________ Finish time: ____________ °Date: ____________ Movements: ____________ Start time: ____________ Finish time: ____________ °Date: ____________ Movements: ____________ Start time: ____________ Finish time: ____________ °Date: ____________ Movements: ____________ Start time: ____________ Finish time: ____________ °Date: ____________ Movements: ____________ Start time: ____________ Finish time: ____________ °Date: ____________ Movements: ____________ Start time: ____________ Finish time: ____________ °Date: ____________ Movements: ____________ Start time: ____________ Finish time: ____________  °Date: ____________ Movements: ____________ Start time: ____________ Finish time: ____________ °Date: ____________ Movements: ____________ Start time: ____________ Finish time: ____________ °Date: ____________ Movements: ____________ Start time: ____________ Finish time: ____________ °Date: ____________ Movements: ____________ Start time: ____________ Finish time: ____________ °Date: ____________ Movements: ____________ Start time: ____________ Finish time: ____________ °Date: ____________ Movements: ____________ Start time: ____________ Finish time: ____________ °Date: ____________ Movements: ____________ Start time: ____________ Finish time: ____________  °Date: ____________ Movements: ____________ Start time: ____________ Finish time: ____________ °Date: ____________ Movements: ____________ Start time: ____________ Finish time: ____________ °Date: ____________ Movements: ____________ Start time: ____________ Finish time: ____________ °Date: ____________ Movements: ____________ Start time: ____________ Finish time: ____________ °Date: ____________ Movements: ____________ Start time: ____________ Finish time: ____________ °Date:  ____________ Movements: ____________ Start time: ____________ Finish time: ____________ °Date: ____________ Movements: ____________ Start time: ____________ Finish time: ____________  °Date: ____________ Movements: ____________ Start time: ____________ Finish time: ____________ °Date: ____________ Movements: ____________ Start time: ____________ Finish time: ____________ °Date: ____________ Movements: ____________ Start time: ____________ Finish time: ____________ °Date: ____________ Movements: ____________ Start time: ____________ Finish time: ____________ °Date: ____________ Movements: ____________ Start time: ____________ Finish time: ____________ °Date: ____________ Movements: ____________ Start time: ____________ Finish time: ____________ °Document Released: 02/16/2006 Document Revised: 06/03/2013 Document Reviewed: 11/14/2011 °[employer]® Patient Information ©2015 [employer]. This information is not intended to replace advice given to you by your health care provider. Make sure you discuss any questions you have with your health care provider. ° °

## 2014-01-12 NOTE — MAU Note (Signed)
Pt has been having abdominal pain for two days right above the umbilicus.  Denies LOF/VB.  Nothings makes it better or worse, has had one emesis occurrence today.

## 2014-01-12 NOTE — MAU Provider Note (Signed)
History   [id number] at 34 weeks in with c/o pain in umbilical area and sharp shooting pain into hips when she is up walking. This has been going on for several days.  CSN: [id number]  Arrival date and time: 01/12/14 1030   None     Chief Complaint  Patient presents with  . Abdominal Pain   Abdominal Pain This is a new problem. The current episode started in the past 7 days. The onset quality is gradual. The problem occurs intermittently. The problem has been unchanged. The pain is located in the periumbilical region. The pain is at a severity of 7/10. The pain is mild. The quality of the pain is burning. The abdominal pain radiates to the periumbilical region. Associated symptoms comments: none.    OB History    Gravida Para Term Preterm AB TAB SAB Ectopic Multiple Living   8 4 4  0 3 3 0 0 0 4      Past Medical History  Diagnosis Date  . No pertinent past medical history   . Shortness of breath   . Ulcer     Past Surgical History  Procedure Laterality Date  . Induced abortion    . Dilation and curettage of uterus      Family History  Problem Relation Age of Onset  . Anesthesia problems Neg Hx   . Diabetes Father     History  Substance Use Topics  . Smoking status: Former Smoker    Types: Cigarettes  . Smokeless tobacco: Never Used  . Alcohol Use: No    Allergies: No Known Allergies  Prescriptions prior to admission  Medication Sig Dispense Refill Last Dose  . acetaminophen (TYLENOL) 500 MG tablet Take 2 tablets (1,000 mg total) by mouth every 6 (six) hours as needed for mild pain or moderate pain. 30 tablet 0 Taking  . desonide (DESOWEN) 0.05 % cream Apply topically 2 (two) times daily. (Patient not taking: Reported on 01/07/2014) 30 g 1 Not Taking  . docusate sodium (COLACE) 100 MG capsule Take 1 capsule (100 mg total) by mouth every 12 (twelve) hours. For constipation 60 capsule 0 Taking  . Fe Fum-FePoly-FA-Vit C-Vit B3 (INTEGRA F) 125-1 MG CAPS Take 1  capsule by mouth daily. 30 capsule 1 Taking  . Prenatal Vit-Fe Fumarate-FA (PRENATAL VITAMINS PLUS) 27-1 MG TABS Take 1 tablet by mouth daily. 30 tablet 10 Taking    Review of Systems  Constitutional: Negative.   HENT: Negative.   Eyes: Negative.   Respiratory: Negative.   Cardiovascular: Negative.   Gastrointestinal: Positive for abdominal pain.  Genitourinary: Negative.   Musculoskeletal: Negative.   Skin: Negative.   Neurological: Negative.   Endo/Heme/Allergies: Negative.   Psychiatric/Behavioral: Negative.    Physical Exam   Last menstrual period 05/08/2013.  Physical Exam  Nursing note and vitals reviewed. Constitutional: She is oriented to person, place, and time. She appears well-developed.  HENT:  Head: Normocephalic.  Eyes: Pupils are equal, round, and reactive to light.  Neck: Normal range of motion.  Cardiovascular: Normal rate, regular rhythm, normal heart sounds and intact distal pulses.   Respiratory: Effort normal and breath sounds normal.  GI: Soft. Bowel sounds are normal.  Genitourinary: Vagina normal and uterus normal.  Musculoskeletal: Normal range of motion.  Neurological: She is alert and oriented to person, place, and time. She has normal reflexes.  Skin: Skin is warm and dry.  Psychiatric: She has a normal mood and affect. Her behavior is normal. Judgment and thought  content normal.    MAU Course  Procedures  MDM sm umbilical hernia, no nausea or vomiting.  Assessment and Plan  sm umbilical hernia no entrapment  Kriste Broman DARLENE 01/12/2014, 11:44 AM G8

## 2014-01-21 ENCOUNTER — Encounter: Payer: Self-pay | Admitting: Obstetrics and Gynecology

## 2014-01-21 ENCOUNTER — Encounter: Payer: Medicaid Other | Admitting: Obstetrics and Gynecology

## 2014-01-26 ENCOUNTER — Ambulatory Visit (HOSPITAL_BASED_OUTPATIENT_CLINIC_OR_DEPARTMENT_OTHER): Payer: Self-pay | Attending: Internal Medicine

## 2014-01-26 ENCOUNTER — Inpatient Hospital Stay (HOSPITAL_COMMUNITY)
Admission: AD | Admit: 2014-01-26 | Discharge: 2014-01-26 | Disposition: A | Discharge status: Home / Self Care | Payer: Medicaid Other | Source: Ambulatory Visit | Attending: Obstetrics and Gynecology | Admitting: Obstetrics and Gynecology

## 2014-01-26 ENCOUNTER — Encounter (HOSPITAL_COMMUNITY): Payer: Self-pay

## 2014-01-26 DIAGNOSIS — Z3A38 38 weeks gestation of pregnancy: Secondary | ICD-10-CM | POA: Diagnosis not present

## 2014-01-26 DIAGNOSIS — O471 False labor at or after 37 completed weeks of gestation: Secondary | ICD-10-CM | POA: Diagnosis not present

## 2014-01-26 LAB — URINALYSIS, ROUTINE W REFLEX MICROSCOPIC
GLUCOSE, UA: NEGATIVE mg/dL
Hgb urine dipstick: NEGATIVE
KETONES UR: 40 mg/dL — AB
Nitrite: NEGATIVE
Protein, ur: NEGATIVE mg/dL
Specific Gravity, Urine: >1.03 — ABNORMAL HIGH (ref 1.005–1.030)
Urobilinogen, UA: 1 mg/dL (ref 0.0–1.0)
pH: 6 (ref 5.0–8.0)

## 2014-01-26 LAB — URINE MICROSCOPIC-ADD ON

## 2014-01-26 MED ORDER — OXYCODONE-ACETAMINOPHEN 5-325 MG PO TABS: 2.0000 | ORAL_TABLET | Freq: Once | ORAL | Status: DC

## 2014-01-26 NOTE — MAU Note (Signed)
Pt states here for contractions q5-10 minutes apart. Denies bleeding or abnormal discharge. Has not been dilated thus far.

## 2014-01-26 NOTE — Discharge Instructions (Signed)
Third Trimester of Pregnancy The third trimester is from week 29 through week 42, months 7 through 9. The third trimester is a time when the fetus is growing rapidly. At the end of the ninth month, the fetus is about 20 inches in length and weighs 6-10 pounds.  BODY CHANGES Your body goes through many changes during pregnancy. The changes vary from woman to woman.   Your weight will continue to increase. You can expect to gain 25-35 pounds (11-16 kg) by the end of the pregnancy.  You may begin to get stretch marks on your hips, abdomen, and breasts.  You may urinate more often because the fetus is moving lower into your pelvis and pressing on your bladder.  You may develop or continue to have heartburn as a result of your pregnancy.  You may develop constipation because certain hormones are causing the muscles that push waste through your intestines to slow down.  You may develop hemorrhoids or swollen, bulging veins (varicose veins).  You may have pelvic pain because of the weight gain and pregnancy hormones relaxing your joints between the bones in your pelvis. Backaches may result from overexertion of the muscles supporting your posture.  You may have changes in your hair. These can include thickening of your hair, rapid growth, and changes in texture. Some women also have hair loss during or after pregnancy, or hair that feels dry or thin. Your hair will most likely return to normal after your baby is born.  Your breasts will continue to grow and be tender. A yellow discharge may leak from your breasts called colostrum.  Your belly button may stick out.  You may feel short of breath because of your expanding uterus.  You may notice the fetus "dropping," or moving lower in your abdomen.  You may have a bloody mucus discharge. This usually occurs a few days to a week before labor begins.  Your cervix becomes thin and soft (effaced) near your due date. WHAT TO EXPECT AT YOUR PRENATAL  EXAMS  You will have prenatal exams every 2 weeks until week 36. Then, you will have weekly prenatal exams. During a routine prenatal visit:  You will be weighed to make sure you and the fetus are growing normally.  Your blood pressure is taken.  Your abdomen will be measured to track your baby's growth.  The fetal heartbeat will be listened to.  Any test results from the previous visit will be discussed.  You may have a cervical check near your due date to see if you have effaced. At around 36 weeks, your caregiver will check your cervix. At the same time, your caregiver will also perform a test on the secretions of the vaginal tissue. This test is to determine if a type of bacteria, Group B streptococcus, is present. Your caregiver will explain this further. Your caregiver may ask you:  What your birth plan is.  How you are feeling.  If you are feeling the baby move.  If you have had any abnormal symptoms, such as leaking fluid, bleeding, severe headaches, or abdominal cramping.  If you have any questions. Other tests or screenings that may be performed during your third trimester include:  Blood tests that check for low iron levels (anemia).  Fetal testing to check the health, activity level, and growth of the fetus. Testing is done if you have certain medical conditions or if there are problems during the pregnancy. FALSE LABOR You may feel small, irregular contractions that   eventually go away. These are called Braxton Hicks contractions, or false labor. Contractions may last for hours, days, or even weeks before true labor sets in. If contractions come at regular intervals, intensify, or become painful, it is best to be seen by your caregiver.  SIGNS OF LABOR   Menstrual-like cramps.  Contractions that are 5 minutes apart or less.  Contractions that start on the top of the uterus and spread down to the lower abdomen and back.  A sense of increased pelvic pressure or back  pain.  A watery or bloody mucus discharge that comes from the vagina. If you have any of these signs before the 37th week of pregnancy, call your caregiver right away. You need to go to the hospital to get checked immediately. HOME CARE INSTRUCTIONS   Avoid all smoking, herbs, alcohol, and unprescribed drugs. These chemicals affect the formation and growth of the baby.  Follow your caregiver's instructions regarding medicine use. There are medicines that are either safe or unsafe to take during pregnancy.  Exercise only as directed by your caregiver. Experiencing uterine cramps is a good sign to stop exercising.  Continue to eat regular, healthy meals.  Wear a good support bra for breast tenderness.  Do not use hot tubs, steam rooms, or saunas.  Wear your seat belt at all times when driving.  Avoid raw meat, uncooked cheese, cat litter boxes, and soil used by cats. These carry germs that can cause birth defects in the baby.  Take your prenatal vitamins.  Try taking a stool softener (if your caregiver approves) if you develop constipation. Eat more high-fiber foods, such as fresh vegetables or fruit and whole grains. Drink plenty of fluids to keep your urine clear or pale yellow.  Take warm sitz baths to soothe any pain or discomfort caused by hemorrhoids. Use hemorrhoid cream if your caregiver approves.  If you develop varicose veins, wear support hose. Elevate your feet for 15 minutes, 3-4 times a day. Limit salt in your diet.  Avoid heavy lifting, wear low heal shoes, and practice good posture.  Rest a lot with your legs elevated if you have leg cramps or low back pain.  Visit your dentist if you have not gone during your pregnancy. Use a soft toothbrush to brush your teeth and be gentle when you floss.  A sexual relationship may be continued unless your caregiver directs you otherwise.  Do not travel far distances unless it is absolutely necessary and only with the approval  of your caregiver.  Take prenatal classes to understand, practice, and ask questions about the labor and delivery.  Make a trial run to the hospital.  Pack your hospital bag.  Prepare the baby's nursery.  Continue to go to all your prenatal visits as directed by your caregiver. SEEK MEDICAL CARE IF:  You are unsure if you are in labor or if your water has broken.  You have dizziness.  You have mild pelvic cramps, pelvic pressure, or nagging pain in your abdominal area.  You have persistent nausea, vomiting, or diarrhea.  You have a bad smelling vaginal discharge.  You have pain with urination. SEEK IMMEDIATE MEDICAL CARE IF:   You have a fever.  You are leaking fluid from your vagina.  You have spotting or bleeding from your vagina.  You have severe abdominal cramping or pain.  You have rapid weight loss or gain.  You have shortness of breath with chest pain.  You notice sudden or extreme swelling   of your face, hands, ankles, feet, or legs.  You have not felt your baby move in over an hour.  You have severe headaches that do not go away with medicine.  You have vision changes. Document Released: 01/11/2001 Document Revised: 01/22/2013 Document Reviewed: 03/20/2012 [employer] Patient Information 2015 [employer]. This information is not intended to replace advice given to you by your health care provider. Make sure you discuss any questions you have with your health care provider.  

## 2014-01-26 NOTE — Progress Notes (Signed)
Dr. Loreta AveAcosta notified of pt's reactive efm tracing, u/a results given, sending home with labor precautions.

## 2014-01-31 NOTE — L&D Delivery Note (Signed)
Delivery Note At 10:41 AM a viable female was delivered via Vaginal, Spontaneous Delivery (Presentation:vtx ;LOA  ).  APGAR:9 , 9; weight  .   Placenta status:spont via shultz , .  Cord:3vc  with the following complications:none .  Cord pH: n/a  Anesthesia: Epidural  Episiotomy: None Lacerations: None Suture Repair: n/a Est. Blood Loss (mL): 50  Mom to postpartum.  Baby to Couplet care / Skin to Skin.  Wyvonnia DuskyLAWSON, [name] 02/09/2014, 10:49 AM

## 2014-02-08 ENCOUNTER — Inpatient Hospital Stay (HOSPITAL_COMMUNITY)
Admission: AD | Admit: 2014-02-08 | Discharge: 2014-02-11 | DRG: 775 | Disposition: A | Discharge status: Home / Self Care | Payer: Medicaid Other | Source: Ambulatory Visit | Attending: Family Medicine | Admitting: Family Medicine

## 2014-02-08 ENCOUNTER — Encounter (HOSPITAL_COMMUNITY): Payer: Self-pay

## 2014-02-08 ENCOUNTER — Inpatient Hospital Stay (HOSPITAL_COMMUNITY): Payer: Medicaid Other

## 2014-02-08 DIAGNOSIS — Z302 Encounter for sterilization: Secondary | ICD-10-CM

## 2014-02-08 DIAGNOSIS — Z5329 Procedure and treatment not carried out because of patient's decision for other reasons: Secondary | ICD-10-CM | POA: Diagnosis not present

## 2014-02-08 DIAGNOSIS — Z87891 Personal history of nicotine dependence: Secondary | ICD-10-CM

## 2014-02-08 DIAGNOSIS — O368390 Maternal care for abnormalities of the fetal heart rate or rhythm, unspecified trimester, not applicable or unspecified: Secondary | ICD-10-CM

## 2014-02-08 DIAGNOSIS — Z3A39 39 weeks gestation of pregnancy: Secondary | ICD-10-CM | POA: Diagnosis present

## 2014-02-08 DIAGNOSIS — Z833 Family history of diabetes mellitus: Secondary | ICD-10-CM

## 2014-02-08 HISTORY — DX: Ventral hernia without obstruction or gangrene: K43.9

## 2014-02-08 HISTORY — DX: Major depressive disorder, single episode, unspecified: F32.9

## 2014-02-08 HISTORY — DX: Anxiety disorder, unspecified: F41.9

## 2014-02-08 HISTORY — DX: Depression, unspecified: F32.A

## 2014-02-08 LAB — COMPREHENSIVE METABOLIC PANEL
ALT: 22 U/L (ref 0–35)
ANION GAP: 7 (ref 5–15)
AST: 23 U/L (ref 0–37)
Albumin: 3 g/dL — ABNORMAL LOW (ref 3.5–5.2)
Alkaline Phosphatase: 158 U/L — ABNORMAL HIGH (ref 39–117)
BILIRUBIN TOTAL: 0.2 mg/dL — AB (ref 0.3–1.2)
BUN: 8 mg/dL (ref 6–23)
CALCIUM: 9 mg/dL (ref 8.4–10.5)
CO2: 22 mmol/L (ref 19–32)
Chloride: 106 mEq/L (ref 96–112)
Creatinine, Ser: 0.45 mg/dL — ABNORMAL LOW (ref 0.50–1.10)
GFR calc non Af Amer: >90 mL/min (ref >90)
Glucose, Bld: 107 mg/dL — ABNORMAL HIGH (ref 70–99)
Potassium: 3.7 mmol/L (ref 3.5–5.1)
Sodium: 135 mmol/L (ref 135–145)
Total Protein: 6.2 g/dL (ref 6.0–8.3)

## 2014-02-08 LAB — PROTEIN / CREATININE RATIO, URINE
CREATININE, URINE: 57 mg/dL
PROTEIN CREATININE RATIO: 0.12 (ref 0.00–0.15)
Total Protein, Urine: 7 mg/dL

## 2014-02-08 LAB — URINALYSIS, ROUTINE W REFLEX MICROSCOPIC
Bilirubin Urine: NEGATIVE
GLUCOSE, UA: NEGATIVE mg/dL
Hgb urine dipstick: NEGATIVE
KETONES UR: NEGATIVE mg/dL
Nitrite: NEGATIVE
PROTEIN: NEGATIVE mg/dL
Specific Gravity, Urine: 1.01 (ref 1.005–1.030)
Urobilinogen, UA: 0.2 mg/dL (ref 0.0–1.0)
pH: 6 (ref 5.0–8.0)

## 2014-02-08 LAB — TYPE AND SCREEN
ABO/RH(D): B POS
Antibody Screen: NEGATIVE

## 2014-02-08 LAB — URINE MICROSCOPIC-ADD ON

## 2014-02-08 LAB — CBC
HCT: 28.9 % — ABNORMAL LOW (ref 36.0–46.0)
Hemoglobin: 9.8 g/dL — ABNORMAL LOW (ref 12.0–15.0)
MCH: 29.5 pg (ref 26.0–34.0)
MCHC: 33.9 g/dL (ref 30.0–36.0)
MCV: 87 fL (ref 78.0–100.0)
Platelets: 237 10*3/uL (ref 150–400)
RBC: 3.32 MIL/uL — ABNORMAL LOW (ref 3.87–5.11)
RDW: 15.8 % — ABNORMAL HIGH (ref 11.5–15.5)
WBC: 10.2 10*3/uL (ref 4.0–10.5)

## 2014-02-08 MED ORDER — OXYTOCIN 40 UNITS IN LACTATED RINGERS INFUSION - SIMPLE MED
62.5000 mL/h | INTRAVENOUS | Status: DC
Start: 2014-02-08 — End: 2014-02-09
  Filled 2014-02-08: qty 1000

## 2014-02-08 MED ORDER — ONDANSETRON HCL 4 MG/2ML IJ SOLN: 4.0000 mg | Freq: Four times a day (QID) | INTRAMUSCULAR | Status: DC | PRN

## 2014-02-08 MED ORDER — LIDOCAINE HCL (PF) 1 % IJ SOLN
30.0000 mL | INTRAMUSCULAR | Status: DC | PRN
Start: 2014-02-08 — End: 2014-02-09
  Filled 2014-02-08: qty 30

## 2014-02-08 MED ORDER — LACTATED RINGERS IV SOLN
INTRAVENOUS | Status: DC
  Administered 2014-02-09: 02:00:00 via INTRAVENOUS

## 2014-02-08 MED ORDER — OXYCODONE-ACETAMINOPHEN 5-325 MG PO TABS
1.0000 | ORAL_TABLET | ORAL | Status: DC | PRN
  Administered 2014-02-09: 1 via ORAL

## 2014-02-08 MED ORDER — ACETAMINOPHEN 325 MG PO TABS: 650.0000 mg | ORAL_TABLET | ORAL | Status: DC | PRN

## 2014-02-08 MED ORDER — CITRIC ACID-SODIUM CITRATE 334-500 MG/5ML PO SOLN: 30.0000 mL | ORAL | Status: DC | PRN

## 2014-02-08 MED ORDER — FENTANYL CITRATE 0.05 MG/ML IJ SOLN
100.0000 ug | INTRAMUSCULAR | Status: DC | PRN
  Administered 2014-02-09: 100 ug via INTRAVENOUS
  Filled 2014-02-08 (×2): qty 2

## 2014-02-08 MED ORDER — LACTATED RINGERS IV SOLN: 500.0000 mL | INTRAVENOUS | Status: DC | PRN

## 2014-02-08 MED ORDER — OXYCODONE-ACETAMINOPHEN 5-325 MG PO TABS: 2.0000 | ORAL_TABLET | ORAL | Status: DC | PRN

## 2014-02-08 MED ORDER — FLEET ENEMA 7-19 GM/118ML RE ENEM: 1.0000 | ENEMA | RECTAL | Status: DC | PRN

## 2014-02-08 MED ORDER — OXYTOCIN BOLUS FROM INFUSION: 500.0000 mL | INTRAVENOUS | Status: DC

## 2014-02-08 NOTE — H&P (Signed)
Elizabeth Mendoza is a 34 y.o. [id number] at 2061w3d presenting for contractions and swelling.  She reports intermittent irregular contractions every 10-15 min for past few days with increasing discomfort. No VB, LOF. GFM. She reports noticing increasing swelling in her hands and feet and was induced for this in the past. She denies HA, visual changes, RUQ pain.   She is followed by the low risk clinic. 3hr glucola was 106.  History OB History    Gravida Para Term Preterm AB TAB SAB Ectopic Multiple Living   8 4 4  0 3 3 0 0 0 4     Past Medical History  Diagnosis Date  . No pertinent past medical history   . Shortness of breath   . Ulcer   . Hernia of abdominal wall   . Anxiety   . Depression    Past Surgical History  Procedure Laterality Date  . Induced abortion    . Dilation and curettage of uterus     Family History: family history includes Diabetes in her father. There is no history of Anesthesia problems. Social History:  reports that she has quit smoking. Her smoking use included Cigarettes. She smoked 0.00 packs per day. She has never used smokeless tobacco. She reports that she does not drink alcohol or use illicit drugs.   Prenatal Transfer Tool  Maternal Diabetes: No Genetic Screening: Declined Maternal Ultrasounds/Referrals: Normal Fetal Ultrasounds or other Referrals:  None Maternal Substance Abuse:  No Significant Maternal Medications:  None Significant Maternal Lab Results:  None Other Comments:  None  ROS In addition to HPI: Constitutional: Negative for fever and chills Respiratory: Negative for dyspnea Cardiovascular: Negative for chest pain or palpitations  Gastrointestinal: Negative for vomiting, diarrhea  Genitourinary: Negative for dysuria and urgency Neurological: Negative for headaches and visual changes   Blood pressure 128/84, pulse 103, temperature 97.7 F (36.5 C), temperature source Oral, resp. rate 18, height 5\' 4"  (1.626 m), weight 105.688 kg (233  lb), last menstrual period 05/08/2013, SpO2 99 %. Exam Physical Exam  GENERAL: Well-appearing, well-nourished female in no distress.  HEENT: Normocephalic, atraumatic HEART: Normal rate RESP: Normal effort ABDOMEN: Soft, non-tender, gravid uterus consistent with dates. EXTREMITIES: Trace nonpitting pedal edema.  NEURO: alert and oriented, DTRs 2+   Dilation: 2.5 Effacement (%): 60 Cervical Position: Posterior Station: -3 Presentation: Vertex Exam by:: Elizabeth DennisonBenji Stanley RN  (3 cm with UC)  FHT:  Baseline 140 , moderate variability, accelerations present, occasional deep variable decelerations Toco: Irregular 2-10 min   Prenatal labs: ABO, Rh: B/POS/-- (05/19 0936) Antibody: NEG (05/19 0936) Rubella: 2.86 (05/19 0936) RPR: NON REAC (10/13 1112)  HBsAg: NEGATIVE (05/19 0936)  HIV: NONREACTIVE (10/13 1112)  GBS: Negative (07/28 0000)   Assessment/Plan: Elizabeth Mendoza is a 34 y.o. [id number] at 5161w3d by 20wk U/S consistent with LMP here for contractions and swelling. #Labor: Expectant management, may place FB #Pain:  Desires epidural #FWB: Category II, will closely monitor #ID:  GBS neg #MOF: Formula #MOC: BTL (papers signed 11/20) #Circ:   N/A  Elizabeth Mendoza, Elizabeth 02/08/2014, 10:05 PM   OB fellow attestation:  I have seen and examined this patient; I agree with above documentation in the resident's note.   Elizabeth Mendoza is a 34 y.o. [id number] here for labor eval  PE: BP 126/82 mmHg  Pulse 107  Temp(Src) 98.1 F (36.7 C) (Oral)  Resp 20  Ht 5\' 4"  (1.626 m)  Wt 221 lb (100.245 kg)  BMI 37.92 kg/m2  SpO2 99%  LMP  05/08/2013 Gen: calm comfortable, NAD Resp: normal effort, no distress Abd: gravid  ROS, labs, PMH reviewed  Plan: Admitted in early latent labor vs braxton hicks contractions, however pt having deep variables intermittently with contractions.  Expectant mgmt, if no change then foley vs pitocin.  No cytotec given variables and current contraction  pattern.  Elizabeth Mendoza 02/09/2014, 2:37 AM

## 2014-02-08 NOTE — MAU Note (Signed)
Pt states here for increased swelling in feet, and contractions. Denies bleeding or lof.

## 2014-02-08 NOTE — MAU Note (Signed)
Pt has gained 11 lbs since last week.

## 2014-02-09 ENCOUNTER — Observation Stay (HOSPITAL_COMMUNITY): Payer: Medicaid Other | Admitting: Anesthesiology

## 2014-02-09 ENCOUNTER — Encounter (HOSPITAL_COMMUNITY): Payer: Self-pay | Admitting: *Deleted

## 2014-02-09 ENCOUNTER — Observation Stay (HOSPITAL_COMMUNITY): Payer: Medicaid Other

## 2014-02-09 DIAGNOSIS — Z3483 Encounter for supervision of other normal pregnancy, third trimester: Secondary | ICD-10-CM | POA: Diagnosis present

## 2014-02-09 DIAGNOSIS — Z3A39 39 weeks gestation of pregnancy: Secondary | ICD-10-CM | POA: Diagnosis not present

## 2014-02-09 LAB — MRSA PCR SCREENING: MRSA BY PCR: NEGATIVE

## 2014-02-09 MED ORDER — OXYCODONE-ACETAMINOPHEN 5-325 MG PO TABS
1.0000 | ORAL_TABLET | ORAL | Status: DC | PRN
  Administered 2014-02-09: 1 via ORAL
  Filled 2014-02-09 (×2): qty 1

## 2014-02-09 MED ORDER — METOCLOPRAMIDE HCL 10 MG PO TABS
10.0000 mg | ORAL_TABLET | Freq: Once | ORAL | Status: AC
  Administered 2014-02-10: 10 mg via ORAL
  Filled 2014-02-09: qty 1

## 2014-02-09 MED ORDER — OXYCODONE-ACETAMINOPHEN 5-325 MG PO TABS
2.0000 | ORAL_TABLET | ORAL | Status: DC | PRN
  Administered 2014-02-10: 2 via ORAL
  Filled 2014-02-09: qty 2

## 2014-02-09 MED ORDER — ZOLPIDEM TARTRATE 5 MG PO TABS: 5.0000 mg | ORAL_TABLET | Freq: Every evening | ORAL | Status: DC | PRN

## 2014-02-09 MED ORDER — PRENATAL MULTIVITAMIN CH
1.0000 | ORAL_TABLET | Freq: Every day | ORAL | Status: DC
  Administered 2014-02-10: 1 via ORAL
  Filled 2014-02-09: qty 1

## 2014-02-09 MED ORDER — PHENYLEPHRINE 40 MCG/ML (10ML) SYRINGE FOR IV PUSH (FOR BLOOD PRESSURE SUPPORT)
80.0000 ug | PREFILLED_SYRINGE | INTRAVENOUS | Status: DC | PRN
  Filled 2014-02-09: qty 20

## 2014-02-09 MED ORDER — SODIUM CHLORIDE 0.9 % IJ SOLN: 3.0000 mL | INTRAMUSCULAR | Status: DC | PRN

## 2014-02-09 MED ORDER — IBUPROFEN 600 MG PO TABS
600.0000 mg | ORAL_TABLET | Freq: Four times a day (QID) | ORAL | Status: DC
  Administered 2014-02-09 – 2014-02-11 (×6): 600 mg via ORAL
  Filled 2014-02-09 (×8): qty 1

## 2014-02-09 MED ORDER — LANOLIN HYDROUS EX OINT: TOPICAL_OINTMENT | CUTANEOUS | Status: DC | PRN

## 2014-02-09 MED ORDER — PHENYLEPHRINE 40 MCG/ML (10ML) SYRINGE FOR IV PUSH (FOR BLOOD PRESSURE SUPPORT): 80.0000 ug | PREFILLED_SYRINGE | INTRAVENOUS | Status: DC | PRN

## 2014-02-09 MED ORDER — WITCH HAZEL-GLYCERIN EX PADS: 1.0000 "application " | MEDICATED_PAD | CUTANEOUS | Status: DC | PRN

## 2014-02-09 MED ORDER — TERBUTALINE SULFATE 1 MG/ML IJ SOLN: 0.2500 mg | Freq: Once | INTRAMUSCULAR | Status: DC | PRN

## 2014-02-09 MED ORDER — FENTANYL 2.5 MCG/ML BUPIVACAINE 1/10 % EPIDURAL INFUSION (WH - ANES)
14.0000 mL/h | INTRAMUSCULAR | Status: DC | PRN
  Administered 2014-02-09 (×2): 14 mL/h via EPIDURAL
  Filled 2014-02-09 (×2): qty 125

## 2014-02-09 MED ORDER — DIBUCAINE 1 % RE OINT
1.0000 | TOPICAL_OINTMENT | RECTAL | Status: DC | PRN
Start: 2014-02-09 — End: 2014-02-11

## 2014-02-09 MED ORDER — DIPHENHYDRAMINE HCL 50 MG/ML IJ SOLN: 12.5000 mg | INTRAMUSCULAR | Status: DC | PRN

## 2014-02-09 MED ORDER — OXYTOCIN 40 UNITS IN LACTATED RINGERS INFUSION - SIMPLE MED: 62.5000 mL/h | INTRAVENOUS | Status: DC | PRN

## 2014-02-09 MED ORDER — FAMOTIDINE 20 MG PO TABS
40.0000 mg | ORAL_TABLET | Freq: Once | ORAL | Status: AC
  Administered 2014-02-10: 40 mg via ORAL
  Filled 2014-02-09: qty 2

## 2014-02-09 MED ORDER — SODIUM CHLORIDE 0.9 % IV SOLN: 250.0000 mL | INTRAVENOUS | Status: DC | PRN

## 2014-02-09 MED ORDER — SODIUM CHLORIDE 0.9 % IJ SOLN: 3.0000 mL | Freq: Two times a day (BID) | INTRAMUSCULAR | Status: DC

## 2014-02-09 MED ORDER — ONDANSETRON HCL 4 MG/2ML IJ SOLN: 4.0000 mg | INTRAMUSCULAR | Status: DC | PRN

## 2014-02-09 MED ORDER — FENTANYL 2.5 MCG/ML BUPIVACAINE 1/10 % EPIDURAL INFUSION (WH - ANES)
INTRAMUSCULAR | Status: DC | PRN
  Administered 2014-02-09: 14 mL/h via EPIDURAL

## 2014-02-09 MED ORDER — EPHEDRINE 5 MG/ML INJ
10.0000 mg | INTRAVENOUS | Status: DC | PRN
  Filled 2014-02-09: qty 2

## 2014-02-09 MED ORDER — BENZOCAINE-MENTHOL 20-0.5 % EX AERO: 1.0000 "application " | INHALATION_SPRAY | CUTANEOUS | Status: DC | PRN

## 2014-02-09 MED ORDER — LIDOCAINE HCL (PF) 1 % IJ SOLN
INTRAMUSCULAR | Status: DC | PRN
  Administered 2014-02-09 (×2): 4 mL

## 2014-02-09 MED ORDER — TETANUS-DIPHTH-ACELL PERTUSSIS 5-2.5-18.5 LF-MCG/0.5 IM SUSP: 0.5000 mL | Freq: Once | INTRAMUSCULAR | Status: DC

## 2014-02-09 MED ORDER — ONDANSETRON HCL 4 MG PO TABS
4.0000 mg | ORAL_TABLET | ORAL | Status: DC | PRN
Start: 2014-02-09 — End: 2014-02-11

## 2014-02-09 MED ORDER — SIMETHICONE 80 MG PO CHEW
80.0000 mg | CHEWABLE_TABLET | ORAL | Status: DC | PRN
  Administered 2014-02-10: 80 mg via ORAL
  Filled 2014-02-09: qty 1

## 2014-02-09 MED ORDER — OXYTOCIN 40 UNITS IN LACTATED RINGERS INFUSION - SIMPLE MED
1.0000 m[IU]/min | INTRAVENOUS | Status: DC
  Administered 2014-02-09: 2 m[IU]/min via INTRAVENOUS
  Administered 2014-02-09: 666 m[IU]/min via INTRAVENOUS

## 2014-02-09 MED ORDER — PRENATAL VITAMINS PLUS 27-1 MG PO TABS: 1.0000 | ORAL_TABLET | Freq: Every day | ORAL | Status: DC

## 2014-02-09 MED ORDER — LACTATED RINGERS IV SOLN
500.0000 mL | Freq: Once | INTRAVENOUS | Status: AC
  Administered 2014-02-09: 500 mL via INTRAVENOUS

## 2014-02-09 MED ORDER — DIPHENHYDRAMINE HCL 25 MG PO CAPS: 25.0000 mg | ORAL_CAPSULE | Freq: Four times a day (QID) | ORAL | Status: DC | PRN

## 2014-02-09 MED ORDER — SENNOSIDES-DOCUSATE SODIUM 8.6-50 MG PO TABS
2.0000 | ORAL_TABLET | ORAL | Status: DC
  Administered 2014-02-10: 2 via ORAL
  Filled 2014-02-09 (×2): qty 2

## 2014-02-09 MED ORDER — LACTATED RINGERS IV SOLN
INTRAVENOUS | Status: DC
  Administered 2014-02-10: 20 mL/h via INTRAVENOUS

## 2014-02-09 NOTE — Progress Notes (Signed)
Elizabeth Mendoza is a 34 y.o. [id number] at 7354w4d by LMP admitted for active labor  Subjective:   Objective: BP 134/97 mmHg  Pulse 98  Temp(Src) 98 F (36.7 C) (Oral)  Resp 18  Ht 5\' 4"  (1.626 m)  Wt 221 lb (100.245 kg)  BMI 37.92 kg/m2  SpO2 99%  LMP 05/08/2013 I/O last 3 completed shifts: In: -  Out: 500 [Urine:500]    FHT:  FHR: 130 bpm, variability: moderate,  accelerations:  Present,  decelerations:  Absent UC:   irregular, every 2-5 minutes SVE:   Dilation: 3.5 Effacement (%): 60 Station: -2 Exam by:: Peabody EnergyLinday Lima, rn  Labs: Lab Results  Component Value Date   WBC 10.2 02/08/2014   HGB 9.8* 02/08/2014   HCT 28.9* 02/08/2014   MCV 87.0 02/08/2014   PLT 237 02/08/2014    Assessment / Plan: Spontaneous labor, progressing normally  Labor: Progressing normally Preeclampsia:  no signs or symptoms of toxicity, intake and ouput balanced and labs stable Fetal Wellbeing:  Category I Pain Control:  Labor support without medications I/D:  n/a Anticipated MOD:  NSVD  Elizabeth Mendoza 02/09/2014, 9:15 AM

## 2014-02-09 NOTE — Anesthesia Preprocedure Evaluation (Addendum)
Anesthesia Evaluation  Patient identified by MRN, date of birth, ID band Patient awake    Reviewed: Allergy & Precautions, NPO status , Patient's Chart, lab work & pertinent test results  History of Anesthesia Complications Negative for: history of anesthetic complications  Airway Mallampati: II  TM Distance: >3 FB Neck ROM: Full    Dental no notable dental hx. (+) Dental Advisory Given   Pulmonary shortness of breath, former smoker,  breath sounds clear to auscultation  Pulmonary exam normal       Cardiovascular negative cardio ROS  Rhythm:Regular Rate:Normal     Neuro/Psych PSYCHIATRIC DISORDERS Anxiety Depression negative neurological ROS     GI/Hepatic negative GI ROS, Neg liver ROS,   Endo/Other  obesity  Renal/GU negative Renal ROS  negative genitourinary   Musculoskeletal negative musculoskeletal ROS (+)   Abdominal   Peds negative pediatric ROS (+)  Hematology negative hematology ROS (+)   Anesthesia Other Findings   Reproductive/Obstetrics (+) Pregnancy                            Anesthesia Physical Anesthesia Plan  ASA: II  Anesthesia Plan: Epidural   Post-op Pain Management:    Induction:   Airway Management Planned:   Additional Equipment:   Intra-op Plan:   Post-operative Plan:   Informed Consent: I have reviewed the patients History and Physical, chart, labs and discussed the procedure including the risks, benefits and alternatives for the proposed anesthesia with the patient or authorized representative who has indicated his/her understanding and acceptance.   Dental advisory given  Plan Discussed with:   Anesthesia Plan Comments:         Anesthesia Quick Evaluation

## 2014-02-09 NOTE — Anesthesia Procedure Notes (Addendum)
Epidural Patient location during procedure: OB Start time: 02/09/2014 3:00 AM End time: 02/09/2014 3:07 AM  Staffing Anesthesiologist: Felipe DroneJUDD, Kelbie Moro JENNETTE Performed by: anesthesiologist   Preanesthetic Checklist Completed: patient identified, site marked, surgical consent, pre-op evaluation, timeout performed, IV checked, risks and benefits discussed and monitors and equipment checked  Epidural Patient position: sitting Prep: site prepped and draped and DuraPrep Patient monitoring: continuous pulse ox and blood pressure Approach: midline Location: L3-L4 Injection technique: LOR saline  Needle:  Needle type: Tuohy  Needle gauge: 17 G Needle length: 9 cm and 9 Needle insertion depth: 7 cm Catheter type: closed end flexible Catheter size: 19 Gauge Catheter at skin depth: 12 cm Test dose: negative  Assessment Events: blood not aspirated, injection not painful, no injection resistance, negative IV test and no paresthesia

## 2014-02-09 NOTE — Progress Notes (Signed)
Elizabeth Mendoza is a 34 y.o. [id number] at 760w4d by LMP admitted for augmentation of labor due to variable decelerations on FHR monitor.   Subjective: Pt comfortable, feeling moderate contractions.   Objective: BP 126/82 mmHg  Pulse 107  Temp(Src) 98.1 F (36.7 C) (Oral)  Resp 20  Ht 5\' 4"  (1.626 m)  Wt 100.245 kg (221 lb)  BMI 37.92 kg/m2  SpO2 99%  LMP 05/08/2013      FHT:  FHR: 135 bpm, variability: moderate,  accelerations:  Present,  decelerations:  Absent UC:   irregular, every 5 minutes SVE:   Dilation: 1.5 Effacement (%): 60 Station: -2 Exam by: Hazeline Junkeryan Savannaha Stonerock, MD  Labs: Lab Results  Component Value Date   WBC 10.2 02/08/2014   HGB 9.8* 02/08/2014   HCT 28.9* 02/08/2014   MCV 87.0 02/08/2014   PLT 237 02/08/2014  LFTs: normal Urine Pr:Cr: 0.12  Assessment / Plan: Augmentation of labor, progressing well  Labor: Foley nulb placement unsuccessful; begin pitocin Preeclampsia:  no signs or symptoms of toxicity and labs stable Fetal Wellbeing:  Category I Pain Control:  Fentanyl I/D:  n/a Anticipated MOD:  NSVD  [name], Zehava Turski 02/09/2014, 2:10 AM

## 2014-02-10 ENCOUNTER — Inpatient Hospital Stay (HOSPITAL_COMMUNITY): Payer: Medicaid Other | Admitting: Anesthesiology

## 2014-02-10 ENCOUNTER — Encounter (HOSPITAL_COMMUNITY)
Admission: AD | Disposition: A | Discharge status: Home / Self Care | Payer: Self-pay | Source: Ambulatory Visit | Attending: Family Medicine

## 2014-02-10 LAB — RPR: RPR Ser Ql: NONREACTIVE

## 2014-02-10 SURGERY — Surgical Case
Anesthesia: Epidural | Laterality: Bilateral

## 2014-02-10 MED ORDER — PROPOFOL 10 MG/ML IV BOLUS
INTRAVENOUS | Status: AC
  Filled 2014-02-10: qty 20

## 2014-02-10 MED ORDER — FENTANYL CITRATE 0.05 MG/ML IJ SOLN
INTRAMUSCULAR | Status: AC
  Filled 2014-02-10: qty 5

## 2014-02-10 MED ORDER — SODIUM BICARBONATE 8.4 % IV SOLN
INTRAVENOUS | Status: AC
  Filled 2014-02-10: qty 50

## 2014-02-10 MED ORDER — ONDANSETRON HCL 4 MG/2ML IJ SOLN
INTRAMUSCULAR | Status: AC
  Filled 2014-02-10: qty 2

## 2014-02-10 MED ORDER — MEPERIDINE HCL 25 MG/ML IJ SOLN: 6.2500 mg | INTRAMUSCULAR | Status: DC | PRN

## 2014-02-10 MED ORDER — BUPIVACAINE HCL (PF) 0.25 % IJ SOLN
INTRAMUSCULAR | Status: AC
  Filled 2014-02-10: qty 30

## 2014-02-10 MED ORDER — LIDOCAINE-EPINEPHRINE (PF) 2 %-1:200000 IJ SOLN
INTRAMUSCULAR | Status: AC
  Filled 2014-02-10: qty 20

## 2014-02-10 MED ORDER — FENTANYL CITRATE 0.05 MG/ML IJ SOLN: 25.0000 ug | INTRAMUSCULAR | Status: DC | PRN

## 2014-02-10 MED ORDER — LIDOCAINE HCL (CARDIAC) 20 MG/ML IV SOLN
INTRAVENOUS | Status: AC
  Filled 2014-02-10: qty 5

## 2014-02-10 MED ORDER — ROCURONIUM BROMIDE 100 MG/10ML IV SOLN
INTRAVENOUS | Status: AC
  Filled 2014-02-10: qty 1

## 2014-02-10 MED ORDER — ACETAMINOPHEN 325 MG PO TABS: 325.0000 mg | ORAL_TABLET | ORAL | Status: DC | PRN

## 2014-02-10 MED ORDER — SCOPOLAMINE 1 MG/3DAYS TD PT72
MEDICATED_PATCH | TRANSDERMAL | Status: DC
Start: 2014-02-10 — End: 2014-02-11
  Administered 2014-02-10: 1.5 mg via TRANSDERMAL
  Filled 2014-02-10: qty 1

## 2014-02-10 MED ORDER — PROMETHAZINE HCL 25 MG/ML IJ SOLN: 6.2500 mg | INTRAMUSCULAR | Status: DC | PRN

## 2014-02-10 MED ORDER — MIDAZOLAM HCL 2 MG/2ML IJ SOLN: 0.5000 mg | Freq: Once | INTRAMUSCULAR | Status: AC | PRN

## 2014-02-10 MED ORDER — DEXAMETHASONE SODIUM PHOSPHATE 4 MG/ML IJ SOLN
INTRAMUSCULAR | Status: AC
  Filled 2014-02-10: qty 1

## 2014-02-10 MED ORDER — SCOPOLAMINE 1 MG/3DAYS TD PT72
1.0000 | MEDICATED_PATCH | TRANSDERMAL | Status: DC
  Administered 2014-02-10: 1.5 mg via TRANSDERMAL

## 2014-02-10 MED ORDER — BUPIVACAINE HCL (PF) 0.25 % IJ SOLN
INTRAMUSCULAR | Status: DC | PRN
  Administered 2014-02-10: 30 mL

## 2014-02-10 MED ORDER — MIDAZOLAM HCL 2 MG/2ML IJ SOLN
INTRAMUSCULAR | Status: AC
  Filled 2014-02-10: qty 2

## 2014-02-10 MED ORDER — KETOROLAC TROMETHAMINE 30 MG/ML IJ SOLN
INTRAMUSCULAR | Status: AC
  Filled 2014-02-10: qty 1

## 2014-02-10 MED ORDER — ACETAMINOPHEN 160 MG/5ML PO SOLN: 325.0000 mg | ORAL | Status: DC | PRN

## 2014-02-10 MED ORDER — KETOROLAC TROMETHAMINE 30 MG/ML IJ SOLN: 30.0000 mg | Freq: Once | INTRAMUSCULAR | Status: AC | PRN

## 2014-02-10 MED ORDER — SODIUM BICARBONATE 8.4 % IV SOLN
INTRAVENOUS | Status: DC | PRN
  Administered 2014-02-10: 5 mL via EPIDURAL
  Administered 2014-02-10: 3 mL via EPIDURAL
  Administered 2014-02-10: 2 mL via EPIDURAL

## 2014-02-10 SURGICAL SUPPLY — 18 items
CHLORAPREP W/TINT 26ML (MISCELLANEOUS) ×3 IMPLANT
CLOTH BEACON ORANGE TIMEOUT ST (SAFETY) ×3 IMPLANT
DRSG OPSITE POSTOP 3X4 (GAUZE/BANDAGES/DRESSINGS) ×3 IMPLANT
GLOVE BIOGEL PI IND STRL 7.5 (GLOVE) ×1 IMPLANT
GLOVE BIOGEL PI INDICATOR 7.5 (GLOVE) ×2
GLOVE ECLIPSE 7.5 STRL STRAW (GLOVE) ×3 IMPLANT
GOWN STRL REUS W/TWL LRG LVL3 (GOWN DISPOSABLE) ×6 IMPLANT
NEEDLE HYPO 22GX1.5 SAFETY (NEEDLE) ×3 IMPLANT
NS IRRIG 1000ML POUR BTL (IV SOLUTION) ×3 IMPLANT
PACK ABDOMINAL MINOR (CUSTOM PROCEDURE TRAY) ×3 IMPLANT
RETRACTOR WOUND ALXS 19CM XSML (INSTRUMENTS) ×1 IMPLANT
RTRCTR WOUND ALEXIS 19CM XSML (INSTRUMENTS) ×3
SPONGE LAP 4X18 X RAY DECT (DISPOSABLE) ×3 IMPLANT
SUT VICRYL 0 UR6 27IN ABS (SUTURE) ×3 IMPLANT
SUT VICRYL 4-0 PS2 18IN ABS (SUTURE) ×3 IMPLANT
SYR CONTROL 10ML LL (SYRINGE) ×3 IMPLANT
TOWEL OR 17X24 6PK STRL BLUE (TOWEL DISPOSABLE) ×6 IMPLANT
TRAY FOLEY CATH 14FR (SET/KITS/TRAYS/PACK) ×3 IMPLANT

## 2014-02-10 NOTE — Progress Notes (Signed)
Ur chart review completed.  
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
## 2014-02-10 NOTE — Transfer of Care (Signed)
Immediate Anesthesia Transfer of Care Note  Patient: Elizabeth Mendoza  Procedure(s) Performed: * No procedures listed *  Patient Location: PACU  Anesthesia Type:Epidural  Level of Consciousness: awake, alert , oriented and patient cooperative  Airway & Oxygen Therapy: Patient Spontanous Breathing  Post-op Assessment: Report given to PACU RN and Post -op Vital signs reviewed and stable  Post vital signs: Reviewed and stable  Complications: No apparent anesthesia complications

## 2014-02-10 NOTE — Progress Notes (Signed)
Post Partum Day 1 Subjective: no complaints, up ad lib, voiding and NPO for BTL this a.m.  Objective: Blood pressure 129/76, pulse 90, temperature 97.9 F (36.6 C), temperature source Oral, resp. rate 18, height 5\' 4"  (1.626 m), weight 221 lb (100.245 kg), last menstrual period 05/08/2013, SpO2 99 %, unknown if currently breastfeeding.  Physical Exam:  General: alert, cooperative, appears stated age and no distress Lochia: appropriate Uterine Fundus: firm Incision: n/a DVT Evaluation: No evidence of DVT seen on physical exam. Negative Homan's sign. No cords or calf tenderness. No significant calf/ankle edema.   Recent Labs  02/08/14 2025  HGB 9.8*  HCT 28.9*    Assessment/Plan: Plan for discharge tomorrow   LOS: 2 days   Wyvonnia DuskyLAWSON, [name] 02/10/2014, 6:47 AM

## 2014-02-10 NOTE — Anesthesia Preprocedure Evaluation (Signed)
Anesthesia Evaluation  Patient identified by MRN, date of birth, ID band Patient awake    Reviewed: Allergy & Precautions, NPO status , Patient's Chart, lab work & pertinent test results  History of Anesthesia Complications Negative for: history of anesthetic complications  Airway Mallampati: II  TM Distance: >3 FB Neck ROM: Full    Dental no notable dental hx. (+) Dental Advisory Given   Pulmonary shortness of breath, former smoker,  breath sounds clear to auscultation  Pulmonary exam normal       Cardiovascular negative cardio ROS  Rhythm:Regular Rate:Normal     Neuro/Psych PSYCHIATRIC DISORDERS Anxiety Depression negative neurological ROS     GI/Hepatic negative GI ROS, Neg liver ROS,   Endo/Other  obesity  Renal/GU negative Renal ROS  negative genitourinary   Musculoskeletal negative musculoskeletal ROS (+)   Abdominal   Peds negative pediatric ROS (+)  Hematology negative hematology ROS (+)   Anesthesia Other Findings   Reproductive/Obstetrics                             Anesthesia Physical  Anesthesia Plan  ASA: II  Anesthesia Plan: Epidural   Post-op Pain Management:    Induction:   Airway Management Planned:   Additional Equipment:   Intra-op Plan:   Post-operative Plan:   Informed Consent: I have reviewed the patients History and Physical, chart, labs and discussed the procedure including the risks, benefits and alternatives for the proposed anesthesia with the patient or authorized representative who has indicated his/her understanding and acceptance.   Dental advisory given  Plan Discussed with:   Anesthesia Plan Comments:         Anesthesia Quick Evaluation

## 2014-02-10 NOTE — Anesthesia Postprocedure Evaluation (Signed)
  Anesthesia Post Note  Patient: Elizabeth Mendoza  Procedure(s) Performed: * No procedures listed *  Anesthesia type: Epidural  Patient location: PACU  Post pain: Pain level controlled  Post assessment: Post-op Vital signs reviewed  Last Vitals:  Filed Vitals:   02/10/14 1030  BP: 129/76  Pulse: 92  Temp:   Resp:     Post vital signs: Reviewed  Level of consciousness: awake  Complications: No apparent anesthesia complications

## 2014-02-10 NOTE — Progress Notes (Signed)
Patient ID: Elizabeth Mendoza, female   DOB: Apr 17, 1980, 34 y.o.   MRN: [id number]  After patient got to the OR, declined PP BTL.  Would like interval tubal.  Will cancel surgery and reschedule during PP period.  Candelaria CelesteSTINSON, Jossalyn Forgione JEHIEL 02/10/2014 9:20 AM

## 2014-02-10 NOTE — Progress Notes (Signed)
Patient ID: Elizabeth Mendoza, female   DOB: 11-02-1980, 34 y.o.   MRN: [id number]  Patient consented for PPBTL.  Risks discussed, including infection, bleeding, damage to adjacent organs.  Consent signed.  Pt to OR when ready.  Candelaria CelesteSTINSON, JACOB JEHIEL 02/10/2014 8:59 AM

## 2014-02-10 NOTE — Anesthesia Postprocedure Evaluation (Signed)
Anesthesia Post Note  Patient: Elizabeth Mendoza  Procedure(s) Performed: * No procedures listed *  Anesthesia type: Epidural  Patient location: Mother/Baby  Post pain: Pain level controlled  Post assessment: Post-op Vital signs reviewed  Last Vitals:  Filed Vitals:   02/10/14 0800  BP: 122/78  Pulse: 90  Temp: 36.4 C  Resp: 20    Post vital signs: Reviewed  Level of consciousness:alert  Complications: No apparent anesthesia complications

## 2014-02-10 NOTE — OR Nursing (Signed)
Patient cancelled procedure once in room and on OR table.

## 2014-02-11 MED ORDER — IBUPROFEN 600 MG PO TABS: 600.0000 mg | ORAL_TABLET | Freq: Four times a day (QID) | ORAL | Status: DC

## 2014-02-11 MED ORDER — SENNOSIDES-DOCUSATE SODIUM 8.6-50 MG PO TABS: 2.0000 | ORAL_TABLET | ORAL | Status: DC

## 2014-02-11 NOTE — Discharge Summary (Signed)
Obstetric Discharge Summary Reason for Admission: onset of labor Prenatal Procedures: none Intrapartum Procedures: spontaneous vaginal delivery Postpartum Procedures: none Complications-Operative and Postpartum: none  Delivery Note At 10:41 AM a viable female was delivered via Vaginal, Spontaneous Delivery (Presentation: ; Occiput Anterior).  APGAR: 9, 9; weight 8 lb 1.6 oz (3675 g).   Placenta status: Intact, Spontaneous.  Cord: 3 vessels with the following complications: None.  Cord pH: not obtained  Anesthesia: Epidural  Episiotomy: None Lacerations: None Suture Repair: n/a Est. Blood Loss (mL): 50  Mom to postpartum.  Baby to Couplet care / Skin to Skin.  Elizabeth Mendoza 02/11/2014, 9:21 AM     Hospital Course:  Active Problems:   Antepartum variable deceleration   Elizabeth Mendoza is a 34 y.o. [id number] s/p NSVD.  Patient presented to OBT contractions and was admitted to L&D.  She has postpartum course that was uncomplicated including no problems with ambulating, PO intake, urination, pain, or bleeding. The pt feels ready to go home and  will be discharged with outpatient follow-up.   Today: No acute events overnight.  Pt denies problems with ambulating, voiding or po intake.  She denies nausea or vomiting.  Pain is well controlled.  She has had flatus. She has had bowel movement.  Lochia Minimal.  Plan for birth control is  Depo on day after discharge in [facility] followed by outpatient BTL.  Method of Feeding: bottle   H/H: Lab Results  Component Value Date/Time   HGB 9.8* 02/08/2014 08:25 PM   HCT 28.9* 02/08/2014 08:25 PM    Discharge Diagnoses: Term Pregnancy-delivered  Discharge Information: Date: 02/11/2014 Activity: pelvic rest Diet: routine  Medications: Ibuprofen Breast feeding:  No: Formula Condition: stable Instructions: refer to handout Discharge to: home   Discharge Instructions    Diet - low sodium heart healthy    Complete by:  As  directed      Discharge instructions    Complete by:  As directed   Taking care of yourself after Baby arrives. Vaginal Bleeding: Vaginal bleeding is common after delivery, with the amount decreasing gradually over 1-2 weeks. If the bleeding increases, is mixed with pus, or is foul-smelling, call your doctor, as this may be a sign of infection.  Abdominal Pain: Abdominal cramping after delivery is common, especially when you breastfeed. The same hormones responsible for letting milk down to your nipple also contract your uterus. If the pain worsens, or occurs more frequently over 48 hrs after delivery, call your doctor.  Fevers: After delivery you are at increased risk of developing an infection. If you have a fever, increased vaginal bleeding, foul-smelling vaginal discharge, or increased abdominal pain, call your doctor.  Breast Feeding: Feeding every 1.5-3 hours keeps Baby satisfied and your milk in good supply. If 3 hours have gone by and Baby is sleeping, wake him/her up to feed. Nurse for 15-20 minutes on one breast before offering the other. Breast-fed babies often lose up to 7% of their birth weight in the first few days of life, but should start gaining about an ounce per day after 4 days. For more information about breastfeeding, go to [employer].  Mastitis (Breast infection): Breaks in the skin or bacteria passing into your breast ducts can cause an infection. If you notice a triangular shaped area on your breast that is red, warm to the touch and tender, call your doctor. It is safe for Baby and helps you to heal faster if you keep breast feeding through this  infection.  Postpartum Depression: Postpartum depression is very common after a woman delivers because of all the hormonal changes happening in her body. If you notice that you start to feel more sad or anxious than usual or have any thoughts of hurting yourself or Baby, tell someone right away.  If you have  any questions or concerns, please call your doctor.            Medication List    STOP taking these medications        docusate sodium 100 MG capsule  Commonly known as:  COLACE      TAKE these medications        acetaminophen 500 MG tablet  Commonly known as:  TYLENOL  Take 2 tablets (1,000 mg total) by mouth every 6 (six) hours as needed for mild pain or moderate pain.     desonide 0.05 % cream  Commonly known as:  DESOWEN  Apply topically 2 (two) times daily.     ibuprofen 600 MG tablet  Commonly known as:  ADVIL,MOTRIN  Take 1 tablet (600 mg total) by mouth every 6 (six) hours.     INTEGRA F 125-1 MG Caps  Take 1 capsule by mouth daily.     PRENATAL VITAMINS PLUS 27-1 MG Tabs  Take 1 tablet by mouth daily.     senna-docusate 8.6-50 MG per tablet  Commonly known as:  Senokot-S  Take 2 tablets by mouth daily.         Elizabeth Mendoza, Elizabeth Mendoza ,Elizabeth Mendoza OB Fellow 02/11/2014,9:21 AM

## 2014-03-24 ENCOUNTER — Ambulatory Visit: Payer: Medicaid Other | Admitting: Nurse Practitioner

## 2014-03-28 ENCOUNTER — Encounter: Payer: Self-pay | Admitting: General Practice

## 2014-04-01 ENCOUNTER — Inpatient Hospital Stay (HOSPITAL_COMMUNITY)
Admission: AD | Admit: 2014-04-01 | Discharge: 2014-04-01 | Disposition: A | Discharge status: Home / Self Care | Payer: Medicaid Other | Source: Ambulatory Visit | Attending: Family Medicine | Admitting: Family Medicine

## 2014-04-01 ENCOUNTER — Encounter (HOSPITAL_COMMUNITY): Payer: Self-pay

## 2014-04-01 DIAGNOSIS — Z87891 Personal history of nicotine dependence: Secondary | ICD-10-CM | POA: Insufficient documentation

## 2014-04-01 DIAGNOSIS — N63 Unspecified lump in breast: Secondary | ICD-10-CM | POA: Diagnosis present

## 2014-04-01 DIAGNOSIS — L732 Hidradenitis suppurativa: Secondary | ICD-10-CM | POA: Diagnosis not present

## 2014-04-01 LAB — URINALYSIS, ROUTINE W REFLEX MICROSCOPIC
Bilirubin Urine: NEGATIVE
Glucose, UA: NEGATIVE mg/dL
Hgb urine dipstick: NEGATIVE
KETONES UR: NEGATIVE mg/dL
NITRITE: NEGATIVE
PROTEIN: NEGATIVE mg/dL
Specific Gravity, Urine: 1.025 (ref 1.005–1.030)
Urobilinogen, UA: 0.2 mg/dL (ref 0.0–1.0)
pH: 6 (ref 5.0–8.0)

## 2014-04-01 LAB — URINE MICROSCOPIC-ADD ON

## 2014-04-01 MED ORDER — DOXYCYCLINE HYCLATE 50 MG PO CAPS: 100.0000 mg | ORAL_CAPSULE | Freq: Two times a day (BID) | ORAL | Status: DC

## 2014-04-01 NOTE — MAU Note (Signed)
Pt C/O knot under R breast, for the past week, is red & brown, & is painful.  Lower back pain for the past 2 days, states she continues to have occasional vaginal pain ever since her delivery in January.  Unable to get appt with clinic until April.

## 2014-04-01 NOTE — Discharge Instructions (Signed)
Hidradenitis Suppurativa, Sweat Gland Abscess Hidradenitis suppurativa is a long lasting (chronic), uncommon disease of the sweat glands. With this, boil-like lumps and scarring develop in the groin, some times under the arms (axillae), and under the breasts. It may also uncommonly occur behind the ears, in the crease of the buttocks, and around the genitals.  CAUSES  The cause is from a blocking of the sweat glands. They then become infected. It may cause drainage and odor. It is not contagious. So it cannot be given to someone else. It most often shows up in puberty (about 10 to 32 years of age). But it may happen much later. It is similar to acne which is a disease of the sweat glands. This condition is slightly more common in African-Americans and women. SYMPTOMS   Hidradenitis usually starts as one or more red, tender, swellings in the groin or under the arms (axilla).  Over a period of hours to days the lesions get larger. They often open to the skin surface, draining clear to yellow-colored fluid.  The infected area heals with scarring. DIAGNOSIS  Your caregiver makes this diagnosis by looking at you. Sometimes cultures (growing germs on plates in the lab) may be taken. This is to see what germ (bacterium) is causing the infection.  TREATMENT   Topical germ killing medicine applied to the skin (antibiotics) are the treatment of choice. Antibiotics taken by mouth (systemic) are sometimes needed when the condition is getting worse or is severe.  Avoid tight-fitting clothing which traps moisture in.  Dirt does not cause hidradenitis and it is not caused by poor hygiene.  Involved areas should be cleaned daily using an antibacterial soap. Some patients find that the liquid form of Lever 2000, applied to the involved areas as a lotion after bathing, can help reduce the odor related to this condition.  Sometimes surgery is needed to drain infected areas or remove scarred tissue. Removal of  large amounts of tissue is used only in severe cases.  Birth control pills may be helpful.  Oral retinoids (vitamin A derivatives) for 6 to 12 months which are effective for acne may also help this condition.  Weight loss will improve but not cure hidradenitis. It is made worse by being overweight. But the condition is not caused by being overweight.  This condition is more common in people who have had acne.  It may become worse under stress. There is no medical cure for hidradenitis. It can be controlled, but not cured. The condition usually continues for years with periods of getting worse and getting better (remission). Document Released: 09/01/2003 Document Revised: 04/11/2011 Document Reviewed: 04/19/2013 Exit[employer] Patient Information 2015 [employer]. This information is not intended to replace advice given to you by your health care provider. Make sure you discuss any questions you have with your health care provider.  

## 2014-04-01 NOTE — MAU Note (Signed)
Pt states quarter sized lump under right bra line area appeared x 1 week ago.  Red and very tender to touch.  Pt also has small pea sized areas that have been draining on and off for a couple of months in rt axillary area.  Pt states since delivery in January she has noticed several hardening areas on her left labia that are very painful.  Pt states all of this has just started occuring since delivery.  Emotional support given, pt very upset and inquiring if it could be cancer.

## 2014-04-01 NOTE — MAU Provider Note (Signed)
History     CSN: [id number]  Arrival date and time: 04/01/14 0942   None     No chief complaint on file.  HPI  Pt presents with complaint of lump under left breast on abdomen, under right arm and in left inguinal area   Past Medical History  Diagnosis Date  . No pertinent past medical history   . Shortness of breath   . Ulcer   . Hernia of abdominal wall   . Anxiety   . Depression     Past Surgical History  Procedure Laterality Date  . Induced abortion    . Dilation and curettage of uterus      Family History  Problem Relation Age of Onset  . Anesthesia problems Neg Hx   . Diabetes Father     History  Substance Use Topics  . Smoking status: Former Smoker    Types: Cigarettes  . Smokeless tobacco: Never Used  . Alcohol Use: No    Allergies: No Known Allergies  Prescriptions prior to admission  Medication Sig Dispense Refill Last Dose  . acetaminophen (TYLENOL) 500 MG tablet Take 2 tablets (1,000 mg total) by mouth every 6 (six) hours as needed for mild pain or moderate pain. (Patient not taking: Reported on 04/01/2014) 30 tablet 0 Past Month at Unknown time  . desonide (DESOWEN) 0.05 % cream Apply topically 2 (two) times daily. (Patient not taking: Reported on 04/01/2014) 30 g 1 Past Week at Unknown time  . Fe Fum-FePoly-FA-Vit C-Vit B3 (INTEGRA F) 125-1 MG CAPS Take 1 capsule by mouth daily. (Patient not taking: Reported on 04/01/2014) 30 capsule 1 Past Month at Unknown time  . ibuprofen (ADVIL,MOTRIN) 600 MG tablet Take 1 tablet (600 mg total) by mouth every 6 (six) hours. (Patient not taking: Reported on 04/01/2014) 30 tablet 0   . Prenatal Vit-Fe Fumarate-FA (PRENATAL VITAMINS PLUS) 27-1 MG TABS Take 1 tablet by mouth daily. (Patient not taking: Reported on 04/01/2014) 30 tablet 10 02/07/2014 at Unknown time  . senna-docusate (SENOKOT-S) 8.6-50 MG per tablet Take 2 tablets by mouth daily. (Patient not taking: Reported on 04/01/2014) 60 tablet 3     Review of Systems    Constitutional: Negative for fever and chills.  Eyes: Negative.   Respiratory: Negative for shortness of breath.   Cardiovascular: Negative for chest pain.  Gastrointestinal: Negative for heartburn, nausea, vomiting and abdominal pain.  Genitourinary: Negative.   Musculoskeletal: Negative.   Skin: Negative for itching and rash.  Neurological: Negative.  Negative for headaches.  Endo/Heme/Allergies: Negative.   Psychiatric/Behavioral: Negative.    Physical Exam   Blood pressure 125/79, pulse 79, temperature 97.8 F (36.6 C), temperature source Oral, resp. rate 18, last menstrual period 03/19/2014, SpO2 100 %, not currently breastfeeding.  Physical Exam  Constitutional: She is oriented to person, place, and time. She appears well-developed and well-nourished.  HENT:  Head: Normocephalic and atraumatic.  Neck: Normal range of motion.  Cardiovascular: Normal rate.   Respiratory: Effort normal.    GI: Soft.  Genitourinary:     Musculoskeletal: Normal range of motion.  Neurological: She is alert and oriented to person, place, and time.  Skin: Skin is warm and dry.  Psychiatric: She has a normal mood and affect. Her behavior is normal. Judgment and thought content normal.    MAU Course  Procedures     Assessment and Plan  Hidradenitis Suppurativa  Doxycycline 100mg  BID x 7days Discharge to home    [facility] HospitalClemmons,Makail Watling Grissett 04/01/2014, 11:42 AM

## 2014-04-15 ENCOUNTER — Emergency Department (HOSPITAL_COMMUNITY)
Admission: EM | Admit: 2014-04-15 | Discharge: 2014-04-15 | Discharge status: Against Medical Advice | Payer: Medicaid Other | Attending: Emergency Medicine | Admitting: Emergency Medicine

## 2014-04-15 ENCOUNTER — Encounter (HOSPITAL_COMMUNITY): Payer: Self-pay | Admitting: *Deleted

## 2014-04-15 ENCOUNTER — Emergency Department (HOSPITAL_COMMUNITY): Payer: Medicaid Other

## 2014-04-15 DIAGNOSIS — R079 Chest pain, unspecified: Secondary | ICD-10-CM | POA: Insufficient documentation

## 2014-04-15 LAB — BASIC METABOLIC PANEL
Anion gap: 7 (ref 5–15)
BUN: 15 mg/dL (ref 6–23)
CO2: 25 mmol/L (ref 19–32)
CREATININE: 0.62 mg/dL (ref 0.50–1.10)
Calcium: 9.1 mg/dL (ref 8.4–10.5)
Chloride: 106 mmol/L (ref 96–112)
GFR calc Af Amer: >90 mL/min (ref >90)
Glucose, Bld: 95 mg/dL (ref 70–99)
Potassium: 3.6 mmol/L (ref 3.5–5.1)
Sodium: 138 mmol/L (ref 135–145)

## 2014-04-15 LAB — I-STAT TROPONIN, ED: Troponin i, poc: 0 ng/mL (ref 0.00–0.08)

## 2014-04-15 LAB — CBC
HEMATOCRIT: 32.1 % — AB (ref 36.0–46.0)
HEMOGLOBIN: 10.8 g/dL — AB (ref 12.0–15.0)
MCH: 29 pg (ref 26.0–34.0)
MCHC: 33.6 g/dL (ref 30.0–36.0)
MCV: 86.3 fL (ref 78.0–100.0)
PLATELETS: 286 10*3/uL (ref 150–400)
RBC: 3.72 MIL/uL — AB (ref 3.87–5.11)
RDW: 15.9 % — ABNORMAL HIGH (ref 11.5–15.5)
WBC: 7.3 10*3/uL (ref 4.0–10.5)

## 2014-04-15 NOTE — ED Notes (Signed)
Called for her twice. Once at 15:25 and again at 15:35. No response. Moving off th floor

## 2014-04-15 NOTE — ED Notes (Signed)
Pt states that she has central chest pain to back with no associated symptoms. NAD noted.

## 2014-04-15 NOTE — ED Provider Notes (Signed)
Patient LWBS after triage.  I did not see or evaluate patient.  Garlon HatchetLisa M Gunther Zawadzki, PA-C 04/15/14 1535  Jerelyn ScottMartha Linker, MD 04/15/14 [phone]

## 2014-05-01 ENCOUNTER — Emergency Department (HOSPITAL_COMMUNITY)
Admission: EM | Admit: 2014-05-01 | Discharge: 2014-05-01 | Disposition: A | Discharge status: Home / Self Care | Payer: Medicaid Other | Attending: Emergency Medicine | Admitting: Emergency Medicine

## 2014-05-01 ENCOUNTER — Encounter (HOSPITAL_COMMUNITY): Payer: Self-pay | Admitting: *Deleted

## 2014-05-01 DIAGNOSIS — Z8659 Personal history of other mental and behavioral disorders: Secondary | ICD-10-CM | POA: Insufficient documentation

## 2014-05-01 DIAGNOSIS — Z87891 Personal history of nicotine dependence: Secondary | ICD-10-CM | POA: Insufficient documentation

## 2014-05-01 DIAGNOSIS — Z79899 Other long term (current) drug therapy: Secondary | ICD-10-CM | POA: Insufficient documentation

## 2014-05-01 DIAGNOSIS — Z792 Long term (current) use of antibiotics: Secondary | ICD-10-CM | POA: Diagnosis not present

## 2014-05-01 DIAGNOSIS — K088 Other specified disorders of teeth and supporting structures: Secondary | ICD-10-CM | POA: Diagnosis present

## 2014-05-01 DIAGNOSIS — K0381 Cracked tooth: Secondary | ICD-10-CM | POA: Diagnosis not present

## 2014-05-01 DIAGNOSIS — Z7952 Long term (current) use of systemic steroids: Secondary | ICD-10-CM | POA: Insufficient documentation

## 2014-05-01 DIAGNOSIS — K0889 Other specified disorders of teeth and supporting structures: Secondary | ICD-10-CM

## 2014-05-01 MED ORDER — PENICILLIN V POTASSIUM 500 MG PO TABS: 500.0000 mg | ORAL_TABLET | Freq: Four times a day (QID) | ORAL | Status: DC

## 2014-05-01 MED ORDER — NAPROXEN 500 MG PO TABS: 500.0000 mg | ORAL_TABLET | Freq: Two times a day (BID) | ORAL | Status: DC

## 2014-05-01 NOTE — Discharge Instructions (Signed)
Take veetid as needed for pain. Take naprosyn as needed for pain. Refer to attached documents for more information.

## 2014-05-01 NOTE — ED Notes (Signed)
Pt reports tooth pain started last night.

## 2014-05-01 NOTE — ED Notes (Signed)
Declined W/C at D/C and was escorted to lobby by RN. 

## 2014-05-01 NOTE — ED Provider Notes (Signed)
CSN: [id number]     Arrival date & time 05/01/14  4098 History   This chart was scribed for non-physician practitioner, Emilia Beck, PA-C, working with Eber Hong, MD, by Abel Presto, ED Scribe. This patient was seen in room TR11C/TR11C and the patient's care was started at 9:57 AM.     Chief Complaint  Patient presents with  . Dental Pain     Patient is a 34 y.o. female presenting with tooth pain. The history is provided by the patient. No language interpreter was used.  Dental Pain Associated symptoms: no facial swelling and no fever    HPI Comments: Elizabeth Mendoza is a 34 y.o. female who presents to the Emergency Department complaining of right sided dental pain with onset last night. Pt has a dentist but does not have an appointment yet. Pt denies fever or facial swelling. Pain is aching and severe without radiation. No aggravating/alleviating factors.   Past Medical History  Diagnosis Date  . No pertinent past medical history   . Shortness of breath   . Ulcer   . Hernia of abdominal wall   . Anxiety   . Depression    Past Surgical History  Procedure Laterality Date  . Induced abortion    . Dilation and curettage of uterus     Family History  Problem Relation Age of Onset  . Anesthesia problems Neg Hx   . Diabetes Father    History  Substance Use Topics  . Smoking status: Former Smoker    Types: Cigarettes  . Smokeless tobacco: Never Used  . Alcohol Use: No   OB History    Gravida Para Term Preterm AB TAB SAB Ectopic Multiple Living   0 3 3 0 0 0 5     Review of Systems  Constitutional: Negative for fever.  HENT: Positive for dental problem. Negative for facial swelling.   All other systems reviewed and are negative.     Allergies  Review of patient's allergies indicates no known allergies.  Home Medications   Prior to Admission medications   Medication Sig Start Date End Date Taking? Authorizing Provider  acetaminophen (TYLENOL) 500 MG  tablet Take 2 tablets (1,000 mg total) by mouth every 6 (six) hours as needed for mild pain or moderate pain. Patient not taking: Reported on 04/01/2014 11/13/13   Kristen N Ward, DO  desonide (DESOWEN) 0.05 % cream Apply topically 2 (two) times daily. Patient not taking: Reported on 04/01/2014 12/20/13   Aviva Signs, CNM  doxycycline (VIBRAMYCIN) 50 MG capsule Take 2 capsules (100 mg total) by mouth 2 (two) times daily. 04/01/14   Lori A Clemmons, CNM  Fe Fum-FePoly-FA-Vit C-Vit B3 (INTEGRA F) 125-1 MG CAPS Take 1 capsule by mouth daily. Patient not taking: Reported on 04/01/2014 11/13/13   Marlis Edelson, CNM  ibuprofen (ADVIL,MOTRIN) 600 MG tablet Take 1 tablet (600 mg total) by mouth every 6 (six) hours. Patient not taking: Reported on 04/01/2014 02/11/14   Ethelda Chick, MD  Prenatal Vit-Fe Fumarate-FA (PRENATAL VITAMINS PLUS) 27-1 MG TABS Take 1 tablet by mouth daily. Patient not taking: Reported on 04/01/2014 11/13/13   Marlis Edelson, CNM  senna-docusate (SENOKOT-S) 8.6-50 MG per tablet Take 2 tablets by mouth daily. Patient not taking: Reported on 04/01/2014 02/11/14   Ethelda Chick, MD   BP 114/76 mmHg  Pulse 78  Temp(Src) 97.4 F (36.3 C)  Resp 14  Ht  (1.626 m)  Wt 201 lb (91.173 kg)  BMI 34.48 kg/m2  SpO2 100%  LMP 04/24/2014 Physical Exam  Constitutional: She is oriented to person, place, and time. She appears well-developed and well-nourished.  HENT:  Head: Normocephalic.  Mouth/Throat: No dental abscesses.  Cracked right lower 2nd molar and cracked right upper 2nd molar; tender to percussion; no abscess noted  Eyes: Conjunctivae and EOM are normal.  Neck: Normal range of motion. Neck supple.  Cardiovascular: Normal rate and regular rhythm.   Pulmonary/Chest: Effort normal and breath sounds normal. No respiratory distress.  Abdominal: Soft. She exhibits no distension. There is no tenderness.  Musculoskeletal: Normal range of motion.  Neurological: She is alert and  oriented to person, place, and time.  Skin: Skin is warm and dry.  Psychiatric: She has a normal mood and affect. Her behavior is normal.  Nursing note and vitals reviewed.   ED Course  Procedures (including critical care time) DIAGNOSTIC STUDIES: Oxygen Saturation is 100% on room air, normal by my interpretation.    COORDINATION OF CARE: 9:59 AM Discussed treatment plan with patient at beside, the patient agrees with the plan and has no further questions at this time. Labs Review Labs Reviewed - No data to display  Imaging Review No results found.   EKG Interpretation None      MDM   Final diagnoses:  Pain, dental   10:05 AM No signs of ludwigs angina. Patient will have Veetid and naprosyn for symptoms. Patient has a dentist she will follow up with. Patient instructed to return with worsening or concerning symptoms.   I personally performed the services described in this documentation, which was scribed in my presence. The recorded information has been reviewed and is accurate.   Emilia BeckKaitlyn Natalie Mceuen, PA-C 05/01/14 [address]  Cicero Noy, PA-C 05/01/14 1006  Eber HongBrian Miller, MD 05/01/14 [phone]

## 2014-05-09 ENCOUNTER — Encounter: Payer: Self-pay | Admitting: Family Medicine

## 2014-05-09 ENCOUNTER — Ambulatory Visit (INDEPENDENT_AMBULATORY_CARE_PROVIDER_SITE_OTHER): Payer: Medicaid Other | Admitting: Family Medicine

## 2014-05-09 DIAGNOSIS — Z3202 Encounter for pregnancy test, result negative: Secondary | ICD-10-CM

## 2014-05-09 DIAGNOSIS — Z3042 Encounter for surveillance of injectable contraceptive: Secondary | ICD-10-CM | POA: Diagnosis not present

## 2014-05-09 DIAGNOSIS — N898 Other specified noninflammatory disorders of vagina: Secondary | ICD-10-CM

## 2014-05-09 LAB — WET PREP, GENITAL
TRICH WET PREP: NONE SEEN
YEAST WET PREP: NONE SEEN

## 2014-05-09 LAB — POCT PREGNANCY, URINE: Preg Test, Ur: NEGATIVE

## 2014-05-09 MED ORDER — MEDROXYPROGESTERONE ACETATE 150 MG/ML IM SUSP
150.0000 mg | Freq: Once | INTRAMUSCULAR | Status: AC
  Administered 2014-05-09: 150 mg via INTRAMUSCULAR

## 2014-05-09 NOTE — Progress Notes (Signed)
Patient ID: Elizabeth Mendoza, female   DOB: [birth year], 34 y.o.   MRN: [id number]  Subjective:     Elizabeth Saxonbony Nodarse is a 34 y.o. female who presents for a postpartum visit. She is 3 months postpartum following a spontaneous vaginal delivery. I have fully reviewed the prenatal and intrapartum course. The delivery was at 39 gestational weeks. Outcome: spontaneous vaginal delivery. Anesthesia: epidural. Postpartum course has been normal. Baby's course has been normal. Baby is feeding by bottlefeeding. Bleeding no bleeding. Bowel function is normal. Bladder function is normal. Patient is sexually active. Contraception method is condoms. Postpartum depression screening: negative.   The following portions of the patient's history were reviewed and updated as appropriate: allergies, current medications, past family history, past medical history, past social history, past surgical history and problem list.  Review of Systems Pertinent items are noted in HPI.   Objective:    BP 126/81 mmHg  Pulse 84  Temp(Src) 98.1 F (36.7 C)  Ht 5\' 4"  (1.626 m)  Wt 215 lb 4.8 oz (97.659 kg)  BMI 36.94 kg/m2  LMP 04/17/2014  Breastfeeding? No  General:  alert, cooperative and no distress   Breasts:  inspection negative, no nipple discharge or bleeding, no masses or nodularity palpable  Lungs: clear to auscultation bilaterally  Heart:  regular rate and rhythm, S1, S2 normal, no murmur, click, rub or gallop  Abdomen: soft, non-tender; bowel sounds normal; no masses,  no organomegaly.  3cm area of induration, no fluctuance on right chest wall under right breast   Vulva:  normal  Vagina: normal vagina and thin yellow vaginal discharge  Cervix:  cervical motion tenderness  Corpus: normal size, contour, position, consistency, mobility, non-tender  Adnexa:  no mass, fullness, tenderness  Rectal Exam: Not performed.        Assessment:     Normal postpartum exam. Vaginal discharge.  Hidradenitis.   Plan:    1.  Contraception: Depo-Provera injections 2. Wet prep done 3.  F/u with PCP for hidradenitis and abscess on right chest wall 4. Follow up in: 1 year or as needed.

## 2014-05-09 NOTE — Progress Notes (Signed)
C/o thick white vaginal discharge and vaginal itching. Wants to start on depoprovera. Last used in 2013.

## 2014-05-09 NOTE — Addendum Note (Signed)
Addended by: Faythe CasaBELLAMY, Abhimanyu Cruces M on: 05/09/2014 09:15 AM   Modules accepted: Orders

## 2014-05-10 ENCOUNTER — Other Ambulatory Visit: Payer: Self-pay | Admitting: Family Medicine

## 2014-05-10 MED ORDER — METRONIDAZOLE 500 MG PO TABS: 500.0000 mg | ORAL_TABLET | Freq: Two times a day (BID) | ORAL | Status: DC

## 2014-05-12 ENCOUNTER — Telehealth: Payer: Self-pay | Admitting: General Practice

## 2014-05-12 ENCOUNTER — Telehealth: Payer: Self-pay | Admitting: *Deleted

## 2014-05-12 NOTE — Telephone Encounter (Signed)
Called patient and informed her of results and antibiotic sent to pharmacy. Patient also aware to avoid alcohol while on flagyl. Patient verbalized understanding to all and had no questions

## 2014-05-12 NOTE — Telephone Encounter (Signed)
Elizabeth Mendoza called and left a message she is calling about results.   Per chart review Elizabeth Mendoza called previously and another nurse called her this am with results.

## 2014-05-12 NOTE — Telephone Encounter (Signed)
-----   Message from Levie HeritageJacob J Stinson, DO sent at 05/10/2014  8:54 AM EDT ----- + BV.  Attempted to call patient, but could not leave message.  Metronidazole called in to pharmacy

## 2014-07-25 ENCOUNTER — Ambulatory Visit: Payer: Medicaid Other

## 2014-08-11 ENCOUNTER — Telehealth: Payer: Self-pay | Admitting: General Practice

## 2014-08-11 DIAGNOSIS — Z789 Other specified health status: Secondary | ICD-10-CM

## 2014-08-11 MED ORDER — LEVONORGESTREL 1.5 MG PO TABS: 1.5000 mg | ORAL_TABLET | Freq: Once | ORAL | Status: DC

## 2014-08-11 NOTE — Telephone Encounter (Signed)
Patient called in to front office stating she missed her depo shot on Friday and had unprotected sex Saturday night and requests prescription for plan b and also wants to make appt for depo shot. Dr Loreta AveAcosta approved Rx for plan b. Informed patient of Rx at pharmacy and patient asked about other birth control options. Asked patient if there was any option she had been thinking about and she states the 5 year one. Discussed IUD with patient and stated that if she would like we can get her an appt to come in and see a provider to discuss getting one at her appt. Patient verbalized understanding and states yes that would be good. Told patient I will let our front office know she needs an appt and they will contact her to schedule one. Reminded patient to use backup birth control, condoms, until she comes in for appt. Patient verbalized understanding and had no questions

## 2014-08-18 ENCOUNTER — Encounter (HOSPITAL_COMMUNITY): Payer: Self-pay | Admitting: *Deleted

## 2014-08-18 ENCOUNTER — Inpatient Hospital Stay (HOSPITAL_COMMUNITY)
Admission: AD | Admit: 2014-08-18 | Discharge: 2014-08-18 | Discharge status: Against Medical Advice | Payer: Medicaid Other | Source: Ambulatory Visit | Attending: Obstetrics & Gynecology | Admitting: Obstetrics & Gynecology

## 2014-08-18 DIAGNOSIS — R109 Unspecified abdominal pain: Secondary | ICD-10-CM | POA: Diagnosis present

## 2014-08-18 DIAGNOSIS — F17210 Nicotine dependence, cigarettes, uncomplicated: Secondary | ICD-10-CM | POA: Insufficient documentation

## 2014-08-18 DIAGNOSIS — Z5321 Procedure and treatment not carried out due to patient leaving prior to being seen by health care provider: Secondary | ICD-10-CM | POA: Insufficient documentation

## 2014-08-18 DIAGNOSIS — K088 Other specified disorders of teeth and supporting structures: Secondary | ICD-10-CM | POA: Insufficient documentation

## 2014-08-18 LAB — URINE MICROSCOPIC-ADD ON

## 2014-08-18 LAB — POCT PREGNANCY, URINE: PREG TEST UR: NEGATIVE

## 2014-08-18 LAB — URINALYSIS, ROUTINE W REFLEX MICROSCOPIC
GLUCOSE, UA: NEGATIVE mg/dL
Ketones, ur: 15 mg/dL — AB
Nitrite: NEGATIVE
PH: 6 (ref 5.0–8.0)
PROTEIN: NEGATIVE mg/dL
Specific Gravity, Urine: >1.03 — ABNORMAL HIGH (ref 1.005–1.030)
Urobilinogen, UA: 1 mg/dL (ref 0.0–1.0)

## 2014-08-18 NOTE — MAU Note (Signed)
Pt signed AMA

## 2014-08-18 NOTE — MAU Note (Signed)
Pt has been taking naproxen prescription for tooth pain. Taking 2 every four hours, has finished prescription.

## 2014-08-18 NOTE — MAU Note (Signed)
Have abd pain for past 2 wks. Lower abd - feels like need to go to bathroom (BM), pressure. And teeth have been hurting.

## 2014-08-18 NOTE — MAU Provider Note (Signed)
History     CSN: [id number]  Arrival date and time: 08/18/14 1112   First Provider Initiated Contact with Patient 08/18/14 1202      Chief Complaint  Patient presents with  . Abdominal Pain   HPI    Elizabeth Mendoza is a 34 y.o. female [phone] presenting to the MAU with a complaint of abdominal pain and tooth pain. She states that the abdominal pain began 2 weeks ago and is an aching pain located generally throughout the abdomen that occurs after eating. She says that it feels like she has to have a bowel movement but when she tries to go to the bathroom she cannot go. She endorses regular bowel movements and denies constipation or hematochezia. Her last BM was yesterday and regular. She said that she had a stomach ulcer "many years ago" and the pain feels like the pain experienced with the old ulcer. She states that she eats mostly fried food. She also endorses mild heartburn. She denies any nausea, vomiting, or sharp right upper quadrant pain.   Elizabeth Mendoza says that the tooth pain is due to a "rotten tooth" located on the right side, lower level all the way to the back and she was supposed to have it removed by her dentist but needs to schedule a follow up. She says that she was prescribed Naproxen for the pain and she is also taking tylenol but neither one is helping. She wants something to control the pain.  She was taking Depo for contraception and has had irregular spotting while on the Depo. She was supposed to have another shot in June but did not receive it. She spotted about a month ago. She denies any dysuria, vaginal bleeding, and discharge.   OB History    Gravida Para Term Preterm AB TAB SAB Ectopic Multiple Living   8 5 5  0 3 3 0 0 0 5      Past Medical History  Diagnosis Date  . No pertinent past medical history   . Shortness of breath   . Ulcer   . Hernia of abdominal wall   . Anxiety   . Depression   . Skin abnormalities     Hiradenitis suppurativa  . Tooth  decay     Past Surgical History  Procedure Laterality Date  . Induced abortion    . Dilation and curettage of uterus      Family History  Problem Relation Age of Onset  . Anesthesia problems Neg Hx   . Diabetes Father     History  Substance Use Topics  . Smoking status: Current Every Day Smoker    Types: Cigarettes  . Smokeless tobacco: Never Used  . Alcohol Use: Yes    Allergies: No Known Allergies  Prescriptions prior to admission  Medication Sig Dispense Refill Last Dose  . ibuprofen (ADVIL,MOTRIN) 200 MG tablet Take 400 mg by mouth every 6 (six) hours as needed.   08/17/2014 at Unknown time  . acetaminophen (TYLENOL) 500 MG tablet Take 2 tablets (1,000 mg total) by mouth every 6 (six) hours as needed for mild pain or moderate pain. (Patient not taking: Reported on 08/18/2014) 30 tablet 0 Taking  . desonide (DESOWEN) 0.05 % cream Apply topically 2 (two) times daily. (Patient not taking: Reported on 04/01/2014) 30 g 1 Not Taking  . levonorgestrel (PLAN B 1-STEP) 1.5 MG tablet Take 1 tablet (1.5 mg total) by mouth once. (Patient not taking: Reported on 08/18/2014) 1 tablet 0   .  naproxen (NAPROSYN) 500 MG tablet Take 1 tablet (500 mg total) by mouth 2 (two) times daily with a meal. (Patient not taking: Reported on 08/18/2014) 30 tablet 0 Taking  . penicillin v potassium (VEETID) 500 MG tablet Take 1 tablet (500 mg total) by mouth 4 (four) times daily. (Patient not taking: Reported on 08/18/2014) 40 tablet 0 Taking   Results for orders placed or performed during the hospital encounter of 08/18/14 (from the past 48 hour(s))  Urinalysis, Routine w reflex microscopic (not at [facility])     Status: Abnormal   Collection Time: 08/18/14 11:30 AM  Result Value Ref Range   Color, Urine YELLOW YELLOW   APPearance HAZY (A) CLEAR   Specific Gravity, Urine >1.030 (H) 1.005 - 1.030   pH 6.0 5.0 - 8.0   Glucose, UA NEGATIVE NEGATIVE mg/dL   Hgb urine dipstick TRACE (A) NEGATIVE   Bilirubin Urine  SMALL (A) NEGATIVE   Ketones, ur 15 (A) NEGATIVE mg/dL   Protein, ur NEGATIVE NEGATIVE mg/dL   Urobilinogen, UA 1.0 0.0 - 1.0 mg/dL   Nitrite NEGATIVE NEGATIVE   Leukocytes, UA TRACE (A) NEGATIVE  Urine microscopic-add on     Status: Abnormal   Collection Time: 08/18/14 11:30 AM  Result Value Ref Range   Squamous Epithelial / LPF FEW (A) RARE   WBC, UA 3-6 <3 WBC/hpf   RBC / HPF 0-2 <3 RBC/hpf  Pregnancy, urine POC     Status: None   Collection Time: 08/18/14 11:32 AM  Result Value Ref Range   Preg Test, Ur NEGATIVE NEGATIVE    Comment:        THE SENSITIVITY OF THIS METHODOLOGY IS >24 mIU/mL     Review of Systems  Constitutional: Negative for fever.  HENT:       + tooth pain   Physical Exam   Blood pressure 120/81, pulse 66, temperature 97.6 F (36.4 C), temperature source Oral, resp. rate 18, height  (1.6 m), weight 93.895 kg (207 lb), last menstrual period 07/18/2014, not currently breastfeeding.  Physical Exam  Constitutional: She is oriented to person, place, and time. She appears well-developed and well-nourished.  HENT:  Mouth/Throat: No oral lesions. Dental caries present. No dental abscesses or lacerations.  No gingival erythema, pus, or lacerations. Multiple caries noted in, particularly in #31  Respiratory: Effort normal.  Musculoskeletal: Normal range of motion.  Neurological: She is alert and oriented to person, place, and time.  Psychiatric: Her mood appears anxious. She is agitated.    MAU Course  Procedures  None  MDM   Assessment and Plan    Patient left AMA prior to discussing plan of care.    Duane Lope, NP 08/18/2014 1:48 PM

## 2014-10-14 ENCOUNTER — Emergency Department (HOSPITAL_COMMUNITY)
Admission: EM | Admit: 2014-10-14 | Discharge: 2014-10-14 | Disposition: A | Discharge status: Home / Self Care | Payer: Medicaid Other

## 2014-10-15 ENCOUNTER — Emergency Department (HOSPITAL_BASED_OUTPATIENT_CLINIC_OR_DEPARTMENT_OTHER)
Admission: EM | Admit: 2014-10-15 | Discharge: 2014-10-15 | Disposition: A | Discharge status: Home / Self Care | Payer: Medicaid Other | Attending: Emergency Medicine | Admitting: Emergency Medicine

## 2014-10-15 ENCOUNTER — Encounter (HOSPITAL_BASED_OUTPATIENT_CLINIC_OR_DEPARTMENT_OTHER): Payer: Self-pay

## 2014-10-15 DIAGNOSIS — Z8659 Personal history of other mental and behavioral disorders: Secondary | ICD-10-CM | POA: Diagnosis not present

## 2014-10-15 DIAGNOSIS — N390 Urinary tract infection, site not specified: Secondary | ICD-10-CM

## 2014-10-15 DIAGNOSIS — Z720 Tobacco use: Secondary | ICD-10-CM | POA: Diagnosis not present

## 2014-10-15 DIAGNOSIS — N898 Other specified noninflammatory disorders of vagina: Secondary | ICD-10-CM | POA: Diagnosis present

## 2014-10-15 DIAGNOSIS — Z3202 Encounter for pregnancy test, result negative: Secondary | ICD-10-CM | POA: Insufficient documentation

## 2014-10-15 DIAGNOSIS — Z872 Personal history of diseases of the skin and subcutaneous tissue: Secondary | ICD-10-CM | POA: Diagnosis not present

## 2014-10-15 DIAGNOSIS — Z8719 Personal history of other diseases of the digestive system: Secondary | ICD-10-CM | POA: Diagnosis not present

## 2014-10-15 DIAGNOSIS — J069 Acute upper respiratory infection, unspecified: Secondary | ICD-10-CM

## 2014-10-15 LAB — URINALYSIS, ROUTINE W REFLEX MICROSCOPIC
BILIRUBIN URINE: NEGATIVE
Glucose, UA: NEGATIVE mg/dL
KETONES UR: NEGATIVE mg/dL
NITRITE: NEGATIVE
PROTEIN: NEGATIVE mg/dL
Specific Gravity, Urine: 1.017 (ref 1.005–1.030)
UROBILINOGEN UA: 1 mg/dL (ref 0.0–1.0)
pH: 6.5 (ref 5.0–8.0)

## 2014-10-15 LAB — URINE MICROSCOPIC-ADD ON

## 2014-10-15 LAB — WET PREP, GENITAL
Clue Cells Wet Prep HPF POC: NONE SEEN
Trich, Wet Prep: NONE SEEN
YEAST WET PREP: NONE SEEN

## 2014-10-15 LAB — PREGNANCY, URINE: PREG TEST UR: NEGATIVE

## 2014-10-15 MED ORDER — SULFAMETHOXAZOLE-TRIMETHOPRIM 800-160 MG PO TABS: 1.0000 | ORAL_TABLET | Freq: Two times a day (BID) | ORAL | Status: AC

## 2014-10-15 NOTE — Discharge Instructions (Signed)
Urinary Tract Infection °Urinary tract infections (UTIs) can develop anywhere along your urinary tract. Your urinary tract is your body's drainage system for removing wastes and extra water. Your urinary tract includes two kidneys, two ureters, a bladder, and a urethra. Your kidneys are a pair of bean-shaped organs. Each kidney is about the size of your fist. They are located below your ribs, one on each side of your spine. °CAUSES °Infections are caused by microbes, which are microscopic organisms, including fungi, viruses, and bacteria. These organisms are so small that they can only be seen through a microscope. Bacteria are the microbes that most commonly cause UTIs. °SYMPTOMS  °Symptoms of UTIs may vary by age and gender of the patient and by the location of the infection. Symptoms in young women typically include a frequent and intense urge to urinate and a painful, burning feeling in the bladder or urethra during urination. Older women and men are more likely to be tired, shaky, and weak and have muscle aches and abdominal pain. A fever may mean the infection is in your kidneys. Other symptoms of a kidney infection include pain in your back or sides below the ribs, nausea, and vomiting. °DIAGNOSIS °To diagnose a UTI, your caregiver will ask you about your symptoms. Your caregiver also will ask to provide a urine sample. The urine sample will be tested for bacteria and white blood cells. White blood cells are made by your body to help fight infection. °TREATMENT  °Typically, UTIs can be treated with medication. Because most UTIs are caused by a bacterial infection, they usually can be treated with the use of antibiotics. The choice of antibiotic and length of treatment depend on your symptoms and the type of bacteria causing your infection. °HOME CARE INSTRUCTIONS °· If you were prescribed antibiotics, take them exactly as your caregiver instructs you. Finish the medication even if you feel better after you  have only taken some of the medication. °· Drink enough water and fluids to keep your urine clear or pale yellow. °· Avoid caffeine, tea, and carbonated beverages. They tend to irritate your bladder. °· Empty your bladder often. Avoid holding urine for long periods of time. °· Empty your bladder before and after sexual intercourse. °· After a bowel movement, women should cleanse from front to back. Use each tissue only once. °SEEK MEDICAL CARE IF:  °· You have back pain. °· You develop a fever. °· Your symptoms do not begin to resolve within 3 days. °SEEK IMMEDIATE MEDICAL CARE IF:  °· You have severe back pain or lower abdominal pain. °· You develop chills. °· You have nausea or vomiting. °· You have continued burning or discomfort with urination. °MAKE SURE YOU:  °· Understand these instructions. °· Will watch your condition. °· Will get help right away if you are not doing well or get worse. °Document Released: 10/27/2004 Document Revised: 07/19/2011 Document Reviewed: 02/25/2011 °ExitCare® Patient Information ©2015 [employer]. This information is not intended to replace advice given to you by your health care provider. Make sure you discuss any questions you have with your health care provider. ° °Upper Respiratory Infection, Adult °An upper respiratory infection (URI) is also sometimes known as the common cold. The upper respiratory tract includes the nose, sinuses, throat, trachea, and bronchi. Bronchi are the airways leading to the lungs. Most people improve within 1 week, but symptoms can last up to 2 weeks. A residual cough may last even longer.  °CAUSES °Many different viruses can infect the   tissues lining the upper respiratory tract. The tissues become irritated and inflamed and often become very moist. Mucus production is also common. A cold is contagious. You can easily spread the virus to others by oral contact. This includes kissing, sharing a glass, coughing, or sneezing. Touching your mouth or  nose and then touching a surface, which is then touched by another person, can also spread the virus. °SYMPTOMS  °Symptoms typically develop 1 to 3 days after you come in contact with a cold virus. Symptoms vary from person to person. They may include: °· Runny nose. °· Sneezing. °· Nasal congestion. °· Sinus irritation. °· Sore throat. °· Loss of voice (laryngitis). °· Cough. °· Fatigue. °· Muscle aches. °· Loss of appetite. °· Headache. °· Low-grade fever. °DIAGNOSIS  °You might diagnose your own cold based on familiar symptoms, since most people get a cold 2 to 3 times a year. Your caregiver can confirm this based on your exam. Most importantly, your caregiver can check that your symptoms are not due to another disease such as strep throat, sinusitis, pneumonia, asthma, or epiglottitis. Blood tests, throat tests, and X-rays are not necessary to diagnose a common cold, but they may sometimes be helpful in excluding other more serious diseases. Your caregiver will decide if any further tests are required. °RISKS AND COMPLICATIONS  °You may be at risk for a more severe case of the common cold if you smoke cigarettes, have chronic heart disease (such as heart failure) or lung disease (such as asthma), or if you have a weakened immune system. The very young and very old are also at risk for more serious infections. Bacterial sinusitis, middle ear infections, and bacterial pneumonia can complicate the common cold. The common cold can worsen asthma and chronic obstructive pulmonary disease (COPD). Sometimes, these complications can require emergency medical care and may be life-threatening. °PREVENTION  °The best way to protect against getting a cold is to practice good hygiene. Avoid oral or hand contact with people with cold symptoms. Wash your hands often if contact occurs. There is no clear evidence that vitamin C, vitamin E, echinacea, or exercise reduces the chance of developing a cold. However, it is always  recommended to get plenty of rest and practice good nutrition. °TREATMENT  °Treatment is directed at relieving symptoms. There is no cure. Antibiotics are not effective, because the infection is caused by a virus, not by bacteria. Treatment may include: °· Increased fluid intake. Sports drinks offer valuable electrolytes, sugars, and fluids. °· Breathing heated mist or steam (vaporizer or shower). °· Eating chicken soup or other clear broths, and maintaining good nutrition. °· Getting plenty of rest. °· Using gargles or lozenges for comfort. °· Controlling fevers with ibuprofen or acetaminophen as directed by your caregiver. °· Increasing usage of your inhaler if you have asthma. °Zinc gel and zinc lozenges, taken in the first 24 hours of the common cold, can shorten the duration and lessen the severity of symptoms. Pain medicines may help with fever, muscle aches, and throat pain. A variety of non-prescription medicines are available to treat congestion and runny nose. Your caregiver can make recommendations and may suggest nasal or lung inhalers for other symptoms.  °HOME CARE INSTRUCTIONS  °· Only take over-the-counter or prescription medicines for pain, discomfort, or fever as directed by your caregiver. °· Use a warm mist humidifier or inhale steam from a shower to increase air moisture. This may keep secretions moist and make it easier to breathe. °· Drink enough   water and fluids to keep your urine clear or pale yellow. °· Rest as needed. °· Return to work when your temperature has returned to normal or as your caregiver advises. You may need to stay home longer to avoid infecting others. You can also use a face mask and careful hand washing to prevent spread of the virus. °SEEK MEDICAL CARE IF:  °· After the first few days, you feel you are getting worse rather than better. °· You need your caregiver's advice about medicines to control symptoms. °· You develop chills, worsening shortness of breath, or brown  or red sputum. These may be signs of pneumonia. °· You develop yellow or brown nasal discharge or pain in the face, especially when you bend forward. These may be signs of sinusitis. °· You develop a fever, swollen neck glands, pain with swallowing, or white areas in the back of your throat. These may be signs of strep throat. °SEEK IMMEDIATE MEDICAL CARE IF:  °· You have a fever. °· You develop severe or persistent headache, ear pain, sinus pain, or chest pain. °· You develop wheezing, a prolonged cough, cough up blood, or have a change in your usual mucus (if you have chronic lung disease). °· You develop sore muscles or a stiff neck. °Document Released: 07/13/2000 Document Revised: 04/11/2011 Document Reviewed: 04/24/2013 °ExitCare® Patient Information ©2015 [employer]. This information is not intended to replace advice given to you by your health care provider. Make sure you discuss any questions you have with your health care provider. ° °

## 2014-10-15 NOTE — ED Provider Notes (Signed)
CSN: [id number]     Arrival date & time 10/15/14  1234 History   First MD Initiated Contact with Patient 10/15/14 1257     Chief Complaint  Patient presents with  . Vaginal Discharge     (Consider location/radiation/quality/duration/timing/severity/associated sxs/prior Treatment) Patient is a 34 y.o. female presenting with vaginal discharge. The history is provided by the patient.  Vaginal Discharge Associated symptoms: no abdominal pain    patient states she's felt bad and felt as if she's had an infection. States she had a sore throat cough with some nasal drainage. Also has a history of hidradenitis but states he is not doing bad right now. Also has had some white thick vaginal discharge. States she uses condoms for protection. Slight dysuria also.  Past Medical History  Diagnosis Date  . No pertinent past medical history   . Shortness of breath   . Ulcer   . Hernia of abdominal wall   . Anxiety   . Depression   . Skin abnormalities     Hiradenitis suppurativa  . Tooth decay    Past Surgical History  Procedure Laterality Date  . Induced abortion    . Dilation and curettage of uterus     Family History  Problem Relation Age of Onset  . Anesthesia problems Neg Hx   . Diabetes Father    Social History  Substance Use Topics  . Smoking status: Current Every Day Smoker    Types: Cigarettes  . Smokeless tobacco: Never Used  . Alcohol Use: Yes   OB History    Gravida Para Term Preterm AB TAB SAB Ectopic Multiple Living   8 5 5  0 3 3 0 0 0 5     Review of Systems  Constitutional: Positive for fatigue.  HENT: Positive for congestion, rhinorrhea and sore throat.   Respiratory: Positive for cough.   Cardiovascular: Negative for chest pain.  Gastrointestinal: Negative for abdominal pain.  Genitourinary: Positive for vaginal discharge.  Skin: Negative for wound.      Allergies  Review of patient's allergies indicates no known allergies.  Home Medications   Prior  to Admission medications   Medication Sig Start Date End Date Taking? Authorizing Provider  sulfamethoxazole-trimethoprim (BACTRIM DS,SEPTRA DS) 800-160 MG per tablet Take 1 tablet by mouth 2 (two) times daily. 10/15/14 10/22/14  Benjiman Core, MD   BP 129/85 mmHg  Pulse 70  Temp(Src) 98.6 F (37 C) (Oral)  Resp 18  Ht 5\' 4"  (1.626 m)  Wt 200 lb (90.719 kg)  BMI 34.31 kg/m2  SpO2 100%  LMP  (LMP Unknown) Physical Exam  Constitutional: She is oriented to person, place, and time. She appears well-developed and well-nourished.  HENT:  Head: Normocephalic and atraumatic.  Posterior pharyngeal erythema without exudate.  Eyes: Pupils are equal, round, and reactive to light.  Neck: Normal range of motion. Neck supple.  Cardiovascular: Normal rate, regular rhythm and normal heart sounds.   No murmur heard. Pulmonary/Chest: Effort normal and breath sounds normal. No respiratory distress. She has no wheezes. She has no rales.  Abdominal: Soft. Bowel sounds are normal. She exhibits no distension.  Musculoskeletal: Normal range of motion.  Lymphadenopathy:    She has cervical adenopathy.  Neurological: She is alert and oriented to person, place, and time. No cranial nerve deficit.  Skin: Skin is warm and dry.  Psychiatric: She has a normal mood and affect. Her speech is normal.  Nursing note and vitals reviewed.  no cervical motion tenderness. Mild  white vaginal cervical discharge.  ED Course  Procedures (including critical care time) Labs Review Labs Reviewed  WET PREP, GENITAL - Abnormal; Notable for the following:    WBC, Wet Prep HPF POC FEW (*)    All other components within normal limits  URINALYSIS, ROUTINE W REFLEX MICROSCOPIC (NOT AT [facility]) - Abnormal; Notable for the following:    APPearance CLOUDY (*)    Hgb urine dipstick TRACE (*)    Leukocytes, UA SMALL (*)    All other components within normal limits  URINE MICROSCOPIC-ADD ON - Abnormal; Notable for the following:     Squamous Epithelial / LPF MANY (*)    Bacteria, UA MANY (*)    All other components within normal limits  PREGNANCY, URINE  RPR  HIV ANTIBODY (ROUTINE TESTING)  GC/CHLAMYDIA PROBE AMP ([place]) NOT AT [facility]    Imaging Review No results found. I have personally reviewed and evaluated these images and lab results as part of my medical decision-making.   EKG Interpretation None      MDM   Final diagnoses:  UTI (lower urinary tract infection)  URI (upper respiratory infection)    Patient with myalgias. Likely has URI symptoms she has red throat. Doubt strep. No focal lung findings. Patient also is complaining of some dysuria and vaginal discharge. Has apparent UTI. Will treat with antibiotics.    Benjiman Core, MD 10/15/14 [phone]

## 2014-10-15 NOTE — ED Notes (Signed)
Vaginal d/c x 1-15. Weeks-used OTC yeast tx w/o relief

## 2014-10-16 LAB — GC/CHLAMYDIA PROBE AMP (~~LOC~~) NOT AT ARMC
Chlamydia: NEGATIVE
Neisseria Gonorrhea: NEGATIVE

## 2014-10-16 LAB — HIV ANTIBODY (ROUTINE TESTING W REFLEX): HIV Screen 4th Generation wRfx: NONREACTIVE

## 2014-10-16 LAB — RPR: RPR Ser Ql: NONREACTIVE

## 2014-12-17 ENCOUNTER — Emergency Department (HOSPITAL_COMMUNITY): Payer: Medicaid Other

## 2014-12-17 ENCOUNTER — Emergency Department (HOSPITAL_COMMUNITY)
Admission: EM | Admit: 2014-12-17 | Discharge: 2014-12-17 | Disposition: A | Discharge status: Home / Self Care | Payer: Self-pay | Attending: Emergency Medicine | Admitting: Emergency Medicine

## 2014-12-17 ENCOUNTER — Encounter (HOSPITAL_COMMUNITY): Payer: Self-pay | Admitting: Emergency Medicine

## 2014-12-17 DIAGNOSIS — R102 Pelvic and perineal pain: Secondary | ICD-10-CM | POA: Insufficient documentation

## 2014-12-17 DIAGNOSIS — Z331 Pregnant state, incidental: Secondary | ICD-10-CM | POA: Insufficient documentation

## 2014-12-17 DIAGNOSIS — Z8719 Personal history of other diseases of the digestive system: Secondary | ICD-10-CM | POA: Insufficient documentation

## 2014-12-17 DIAGNOSIS — Z3A01 Less than 8 weeks gestation of pregnancy: Secondary | ICD-10-CM | POA: Insufficient documentation

## 2014-12-17 DIAGNOSIS — Z872 Personal history of diseases of the skin and subcutaneous tissue: Secondary | ICD-10-CM | POA: Insufficient documentation

## 2014-12-17 DIAGNOSIS — Z8659 Personal history of other mental and behavioral disorders: Secondary | ICD-10-CM | POA: Insufficient documentation

## 2014-12-17 DIAGNOSIS — O209 Hemorrhage in early pregnancy, unspecified: Secondary | ICD-10-CM

## 2014-12-17 DIAGNOSIS — F17210 Nicotine dependence, cigarettes, uncomplicated: Secondary | ICD-10-CM | POA: Insufficient documentation

## 2014-12-17 DIAGNOSIS — N939 Abnormal uterine and vaginal bleeding, unspecified: Secondary | ICD-10-CM | POA: Insufficient documentation

## 2014-12-17 LAB — RPR: RPR Ser Ql: NONREACTIVE

## 2014-12-17 LAB — URINALYSIS, ROUTINE W REFLEX MICROSCOPIC
BILIRUBIN URINE: NEGATIVE
Glucose, UA: NEGATIVE mg/dL
Ketones, ur: NEGATIVE mg/dL
Leukocytes, UA: NEGATIVE
Nitrite: NEGATIVE
PH: 6 (ref 5.0–8.0)
Protein, ur: NEGATIVE mg/dL
SPECIFIC GRAVITY, URINE: 1.022 (ref 1.005–1.030)

## 2014-12-17 LAB — URINE MICROSCOPIC-ADD ON

## 2014-12-17 LAB — GC/CHLAMYDIA PROBE AMP (~~LOC~~) NOT AT ARMC
Chlamydia: NEGATIVE
Neisseria Gonorrhea: NEGATIVE

## 2014-12-17 LAB — CBC WITH DIFFERENTIAL/PLATELET
Basophils Absolute: 0 10*3/uL (ref 0.0–0.1)
Basophils Relative: 0 %
Eosinophils Absolute: 0 10*3/uL (ref 0.0–0.7)
Eosinophils Relative: 1 %
HEMATOCRIT: 31.7 % — AB (ref 36.0–46.0)
Hemoglobin: 10.5 g/dL — ABNORMAL LOW (ref 12.0–15.0)
LYMPHS PCT: 45 %
Lymphs Abs: 3.6 10*3/uL (ref 0.7–4.0)
MCH: 28.8 pg (ref 26.0–34.0)
MCHC: 33.1 g/dL (ref 30.0–36.0)
MCV: 86.8 fL (ref 78.0–100.0)
MONOS PCT: 6 %
Monocytes Absolute: 0.5 10*3/uL (ref 0.1–1.0)
NEUTROS ABS: 3.9 10*3/uL (ref 1.7–7.7)
NEUTROS PCT: 48 %
Platelets: 276 10*3/uL (ref 150–400)
RBC: 3.65 MIL/uL — AB (ref 3.87–5.11)
RDW: 14.8 % (ref 11.5–15.5)
WBC: 8.1 10*3/uL (ref 4.0–10.5)

## 2014-12-17 LAB — COMPREHENSIVE METABOLIC PANEL
ALT: 12 U/L — ABNORMAL LOW (ref 14–54)
AST: 14 U/L — ABNORMAL LOW (ref 15–41)
Albumin: 3.4 g/dL — ABNORMAL LOW (ref 3.5–5.0)
Alkaline Phosphatase: 56 U/L (ref 38–126)
Anion gap: 7 (ref 5–15)
BUN: <5 mg/dL — ABNORMAL LOW (ref 6–20)
CO2: 25 mmol/L (ref 22–32)
CREATININE: 0.62 mg/dL (ref 0.44–1.00)
Calcium: 8.9 mg/dL (ref 8.9–10.3)
Chloride: 107 mmol/L (ref 101–111)
Glucose, Bld: 106 mg/dL — ABNORMAL HIGH (ref 65–99)
POTASSIUM: 3.8 mmol/L (ref 3.5–5.1)
SODIUM: 139 mmol/L (ref 135–145)
Total Bilirubin: 0.4 mg/dL (ref 0.3–1.2)
Total Protein: 6.3 g/dL — ABNORMAL LOW (ref 6.5–8.1)

## 2014-12-17 LAB — I-STAT BETA HCG BLOOD, ED (MC, WL, AP ONLY): HCG, QUANTITATIVE: 1524.1 m[IU]/mL — AB (ref <5)

## 2014-12-17 LAB — HCG, QUANTITATIVE, PREGNANCY: HCG, BETA CHAIN, QUANT, S: 1504 m[IU]/mL — AB (ref <5)

## 2014-12-17 LAB — WET PREP, GENITAL
CLUE CELLS WET PREP: NONE SEEN
Sperm: NONE SEEN
TRICH WET PREP: NONE SEEN
Yeast Wet Prep HPF POC: NONE SEEN

## 2014-12-17 LAB — HIV ANTIBODY (ROUTINE TESTING W REFLEX): HIV SCREEN 4TH GENERATION: NONREACTIVE

## 2014-12-17 MED ORDER — MORPHINE SULFATE (PF) 4 MG/ML IV SOLN
4.0000 mg | Freq: Once | INTRAVENOUS | Status: AC
Start: 2014-12-17 — End: 2014-12-17
  Administered 2014-12-17: 4 mg via INTRAVENOUS
  Filled 2014-12-17: qty 1

## 2014-12-17 MED ORDER — MORPHINE SULFATE (PF) 4 MG/ML IV SOLN
4.0000 mg | Freq: Once | INTRAVENOUS | Status: AC
  Administered 2014-12-17: 4 mg via INTRAVENOUS

## 2014-12-17 MED ORDER — ONDANSETRON HCL 4 MG/2ML IJ SOLN
4.0000 mg | Freq: Once | INTRAMUSCULAR | Status: AC
  Administered 2014-12-17: 4 mg via INTRAVENOUS

## 2014-12-17 MED ORDER — HYDROCODONE-ACETAMINOPHEN 5-325 MG PO TABS: 1.0000 | ORAL_TABLET | Freq: Four times a day (QID) | ORAL | Status: DC | PRN

## 2014-12-17 NOTE — Discharge Instructions (Signed)
Emergency Department Resource Guide °1) Find a Doctor and Pay Out of Pocket °Although you won't have to find out who is covered by your insurance plan, it is a good idea to ask around and get recommendations. You will then need to call the office and see if the doctor you have chosen will accept you as a new patient and what types of options they offer for patients who are self-pay. Some doctors offer discounts or will set up payment plans for their patients who do not have insurance, but you will need to ask so you aren't surprised when you get to your appointment. ° °2) Contact Your Local Health Department °Not all health departments have doctors that can see patients for sick visits, but many do, so it is worth a call to see if yours does. If you don't know where your local health department is, you can check in your phone book. The CDC also has a tool to help you locate your state's health department, and many state websites also have listings of all of their local health departments. ° °3) Find a Walk-in Clinic °If your illness is not likely to be very severe or complicated, you may want to try a walk in clinic. These are popping up all over the country in pharmacies, drugstores, and shopping centers. They're usually staffed by nurse practitioners or physician assistants that have been trained to treat common illnesses and complaints. They're usually fairly quick and inexpensive. However, if you have serious medical issues or chronic medical problems, these are probably not your best option. ° °No Primary Care Doctor: °- Call [facility] at  832-8000 - they can help you locate a primary care doctor that  accepts your insurance, provides certain services, etc. °- Physician Referral Service- 1-800-533-3463 ° °Chronic Pain Problems: °Organization         Address  Phone   Notes  °[facility]  ([phone]) [phone] Patients need to be referred by their primary care doctor.  ° °Medication  Assistance: °Organization         Address  Phone   Notes  °[place] [facility] [address] °[place], [place] [address] ([phone]) 641-8030 --Must be a resident of [place] °-- Must have NO insurance coverage whatsoever (no Medicaid/ Medicare, etc.) °-- The pt. MUST have a primary care doctor that directs their care regularly and follows them in the community °  °[facility]  (866) 331-1348   °[employer]  (888) 892-1162   ° °Agencies that provide inexpensive medical care: °Organization         Address                                                       Phone                                                                            Notes  °[facility]  ([phone]) [phone]   °[facility]    ([phone])   [phone]   °[facility] [address] °[place], [place] [address] ([phone]) [phone]   °[facility] [address], °[place] ([phone]) [phone]   °[facility]    (336) 373-0678   °[facility]    (336) 272-1050   °[facility] ° [address], [place] Phone:  (336) [phone], Fax:  ([phone]) [phone] Hours of Operation:  9 am - 6 pm, M-F.  Also accepts Medicaid/Medicare and self-pay.  °[facility] ° [address], [place] Phone: ([phone]) [phone], Fax: ([phone]) [phone]. Hours of Operation:  8:30 am - 5:30 pm, M-F.  Also accepts Medicaid and self-pay.  °[facility] [place] [address], [place] Phone: ([phone]) [phone]   °[facility] [address], [place], [place] (336)[phone], Ext. 123 Mondays & Thursdays: 7-9 AM.  First 15 patients are seen on a first come, first serve basis. °  ° °Medicaid-accepting [place] Providers: ° °Organization         Address                                                                       Phone                               Notes  °[facility] [address], [place] ([phone]) [phone] Also accepts self-pay patients.  °[facility] [address], [place] ° (336) [phone]   °[facility] [address], [place] ([phone]) [phone]   °[facility] [address] [place] [address], [place] ([phone]) [phone]   °[facility] [address], [place]  ° ([phone]) [phone] Only accepts [place] Access Medicaid patients after they have their name applied to their card.  ° °Self-Pay (no insurance) in [place]: °  °Organization         Address                                                     Phone               Notes  °Sickle Cell Patients, [facility] [address], [place] ([phone]) [phone]   °[facility] [address], [place] ([phone]) [phone]   °[facility] [place] ° [address], [place] (336) [phone]   °[facility]/Dr. Osei-Bonsu ° [address] [place] [address], [place] or [address], [place] ([phone]) [phone] Phone number for both [place] and [place] locations is the same.  °[facility] [address], [place] ([phone]) [phone]   °[facility] [address] [place] [address], [place] or [address] ([phone]) [phone] °(336) [phone]   °[facility] [address],  (336) [phone], phone; (336) [phone], fax Sees patients 1st and 3rd Saturday of   every month.  Must not qualify for public or private insurance (i.e. Medicaid, Medicare, [place] Health Choice, Veterans' Benefits) • Household income should be no more than 200% of the poverty level •The clinic cannot treat you if you are pregnant or think you are pregnant • Sexually transmitted diseases are not treated at the clinic.  ° °_____________Dental Care:______________ °Organization         Address                                  Phone                       Notes  °[place]  [facility] [address], [place] ([phone]) [phone] Accepts children up to age 21 who are enrolled in Medicaid or [place] Health Choice; pregnant women with a Medicaid card; and children who have applied for Medicaid or [place] Health Choice, but were declined, whose parents can pay a reduced fee at time of service.  °[place] [facility] [place]  [address], [place] (336) 641-7733 Accepts children up to age 21 who are enrolled in Medicaid or [place] Health Choice; pregnant women with a Medicaid card; and children who have applied for Medicaid or [place] Health Choice, but were declined, whose parents can pay a reduced fee at time of service.  °[facility] ° [address], [place] ([phone]) [phone] Patients are seen by appointment only. Walk-ins are not accepted. [facility] will see patients 18 years of age and older. °Monday - Tuesday (8am-5pm) °Most Wednesdays (8:30-5pm) °$30 per visit, cash only  °[facility] ° [address], [place] ([phone]) [phone] Patients are seen by appointment only. Walk-ins are not accepted. [facility] will see patients 18 years of age and older. °One Wednesday Evening (Monthly: Volunteer Based).  $30 per visit, cash only  °[facility]  ([phone]) [phone] for adults; Children under age 4, call Graduate Pediatric Dentistry at ([phone]) [phone]. Children aged 4-14, please call ([phone]) [phone] to request a pediatric application. ° Dental services are provided in all areas of dental care including fillings, crowns and bridges, complete and partial dentures, implants, gum treatment, root canals, and extractions. Preventive care is also provided. Treatment is provided to both adults and children. °Patients are selected via a lottery and there is often a waiting list. °  °[facility] [address], °[place] ° (336) 763-8833  [email or web address] °  °[facility] [address], [place], [place] (336)723-1848, Ext. 123 Second and Fourth Thursday of each month, opens at 6:30 AM; Clinic ends at 9 AM.  Patients are seen on a first-come first-served basis, and a limited number are seen during each clinic.  ° °[facility] ° [address], [place], [place] ([phone]) [phone]   Eligibility Requirements °You must have lived in [place], [place], or [place] counties for at least the last three months. °  You cannot be eligible for state or federal sponsored healthcare insurance, including Veterans Administration, Medicaid, or Medicare. °  You generally cannot be eligible for healthcare insurance through your employer.  °  How to apply: °Eligibility screenings are held every Tuesday and Wednesday afternoon from 1:00 pm until 4:00 pm. You do not need an appointment for the interview!  °  [facility] [address], [place], [place] [phone]   °[facility]  [phone]   °[facility]  [phone]   °[facility]  [phone]   ° °

## 2014-12-17 NOTE — ED Notes (Signed)
Pt. arrived with PTAR  reports mid/low abdominal pain onset 3 am this morning , denies nausea or emesis , no fever or diarrhea .

## 2014-12-17 NOTE — ED Provider Notes (Signed)
CSN: [id number]     Arrival date & time 12/17/14  0419 History   First MD Initiated Contact with Patient 12/17/14 0455     Chief Complaint  Patient presents with  . Abdominal Pain     (Consider location/radiation/quality/duration/timing/severity/associated sxs/prior Treatment) The history is provided by the patient.   34 year old female comes in with severe suprapubic pain which started at 3 AM. She states the pain is sharp and rates at 10/10. Nothing makes it better nothing makes it worse. There is some radiation of pain into her buttock area. She denies nausea or vomiting. She denies fever, chills, sweats. She denies dysuria. She denies constipation or diarrhea. Last menses was October 17 and she would be due to come on her menses soon. She has not taken anything for her pain. She is not using any contraception.  Past Medical History  Diagnosis Date  . No pertinent past medical history   . Shortness of breath   . Ulcer   . Hernia of abdominal wall   . Anxiety   . Depression   . Skin abnormalities     Hiradenitis suppurativa  . Tooth decay    Past Surgical History  Procedure Laterality Date  . Induced abortion    . Dilation and curettage of uterus     Family History  Problem Relation Age of Onset  . Anesthesia problems Neg Hx   . Diabetes Father    Social History  Substance Use Topics  . Smoking status: Current Every Day Smoker    Types: Cigarettes  . Smokeless tobacco: Never Used  . Alcohol Use: Yes   OB History    Gravida Para Term Preterm AB TAB SAB Ectopic Multiple Living   8 5 5  0 3 3 0 0 0 5     Review of Systems  All other systems reviewed and are negative.     Allergies  Review of patient's allergies indicates no known allergies.  Home Medications   Prior to Admission medications   Not on File   BP 108/69 mmHg  Pulse 95  Temp(Src) 97.9 F (36.6 C) (Oral)  Resp 16  SpO2 100%  LMP 11/17/2014 (Approximate) Physical Exam  Nursing note and  vitals reviewed.  34 year old female, resting comfortably and in no acute distress. Vital signs are normal. Oxygen saturation is 100%, which is normal. Head is normocephalic and atraumatic. PERRLA, EOMI. Oropharynx is clear. Neck is nontender and supple without adenopathy or JVD. Back is nontender and there is no CVA tenderness. Lungs are clear without rales, wheezes, or rhonchi. Chest is nontender. Heart has regular rate and rhythm without murmur. Abdomen is soft, flat, with well localized tenderness to the suprapubic area. There is no rebound or guarding. There are no masses or hepatosplenomegaly and peristalsis is hypoactive. Pelvic: Normal external female genitalia. Small amount of blood present in the vaginal vault. Cervix appears closed. On bimanual exam, there are no adnexal masses or tenderness. There is mild tenderness on motion of the cervix. Fundus is enlarged to 6-8 week size and is tender. Extremities have no cyanosis or edema, full range of motion is present. Skin is warm and dry without rash. Neurologic: Mental status is normal, cranial nerves are intact, there are no motor or sensory deficits.  ED Course  Procedures (including critical care time) Labs Review Results for orders placed or performed during the hospital encounter of 12/17/14  Comprehensive metabolic panel  Result Value Ref Range   Sodium 139 135 -  145 mmol/L   Potassium 3.8 3.5 - 5.1 mmol/L   Chloride 107 101 - 111 mmol/L   CO2 25 22 - 32 mmol/L   Glucose, Bld 106 (H) 65 - 99 mg/dL   BUN <5 (L) 6 - 20 mg/dL   Creatinine, Ser 4.09 0.44 - 1.00 mg/dL   Calcium 8.9 8.9 - 81.1 mg/dL   Total Protein 6.3 (L) 6.5 - 8.1 g/dL   Albumin 3.4 (L) 3.5 - 5.0 g/dL   AST 14 (L) 15 - 41 U/L   ALT 12 (L) 14 - 54 U/L   Alkaline Phosphatase 56 38 - 126 U/L   Total Bilirubin 0.4 0.3 - 1.2 mg/dL   GFR calc non Af Amer >60 >60 mL/min   GFR calc Af Amer >60 >60 mL/min   Anion gap 7 5 - 15  CBC with Differential  Result  Value Ref Range   WBC 8.1 4.0 - 10.5 K/uL   RBC 3.65 (L) 3.87 - 5.11 MIL/uL   Hemoglobin 10.5 (L) 12.0 - 15.0 g/dL   HCT 91.4 (L) 78.2 - 95.6 %   MCV 86.8 78.0 - 100.0 fL   MCH 28.8 26.0 - 34.0 pg   MCHC 33.1 30.0 - 36.0 g/dL   RDW 21.3 08.6 - 57.8 %   Platelets 276 150 - 400 K/uL   Neutrophils Relative % 48 %   Neutro Abs 3.9 1.7 - 7.7 K/uL   Lymphocytes Relative 45 %   Lymphs Abs 3.6 0.7 - 4.0 K/uL   Monocytes Relative 6 %   Monocytes Absolute 0.5 0.1 - 1.0 K/uL   Eosinophils Relative 1 %   Eosinophils Absolute 0.0 0.0 - 0.7 K/uL   Basophils Relative 0 %   Basophils Absolute 0.0 0.0 - 0.1 K/uL  I-Stat beta hCG blood, ED  Result Value Ref Range   I-stat hCG, quantitative 1524.1 (H) <5 mIU/mL   Comment 3           Imaging Review No results found. I have personally reviewed and evaluated these images and lab results as part of my medical decision-making.   MDM   Final diagnoses:  Pelvic pain in female  First trimester bleeding    Suprapubic pain of uncertain cause. Old records are reviewed and she had a delivery at term last January. Ultrasound in 2013 did show presence of 2 fibroids. It is possible that current pain is related to hemorrhage or torsion of fibroids.  Pregnancy test is come back positive. Patient is now gravida 43, para 5 with 3 voluntary interruptions of pregnancy. Pelvic exam shows presence of blood so she needs to be evaluated for possible ectopic pregnancy. She is being sent for ultrasound.  Pelvic ultrasound is still pending. Case is signed out to Dr. Anitra Lauth.  Dione Booze, MD 12/17/14 [phone]

## 2014-12-17 NOTE — ED Provider Notes (Signed)
US Ob Transvaginal (Final result) Result time: 12/17/14 08:58:40   Final result by Rad Results In Interface (12/17/14 08:58:40)   Narrative:   CLINICAL DATA: 34 year old female with right lower quadrant pelvic pain and bleeding since this morning. Quantitative beta HCG 1504. Gestational age by LMP 4 weeks 2 days. Initial encounter.  EXAM: OBSTETRIC <14 WK [place] AND TRANSVAGINAL OB US  TECHNIQUE: Both transabdominal and transvaginal ultrasound examinations were performed for complete evaluation of the gestation as well as the maternal uterus, adnexal regions, and pelvic cul-de-sac. Transvaginal technique was performed to assess early pregnancy.  COMPARISON: None relevant; second trimester OB ultrasound 09/24/2013.  FINDINGS: Intrauterine gestational sac: Not visualize  Yolk sac: Not visualize  Embryo: Not visualize  Cardiac Activity: Not detected  Maternal uterus/adnexae: The endometrium measures 1.8 cm in thickness but appears bland. The uterus measures 8.0 x 6.3 x 5.4 cm.  There is a large 6.5 cm simple appearing cyst in the region of the right adnexa (image 55). No solid or vascular elements. Superimposed more complex appearing thick walled cyst measuring up to 2.2 cm (image 70). This has only mild peripheral vascularity (image 69) and no convincing internal vascularity.  The left ovary measures 2.2 x 1.4 x 1.5 cm and is unremarkable. There is a small volume of free fluid adjacent to the right adnexa.  IMPRESSION: 1. No IUP identified. Differential considerations include failed IUP, ectopic (right adnexa), and early/occult IUP (felt least likely given current quantitative beta HCG level). 2. Negative left ovary. Two right adnexal cysts, the larger is 6.5 cm and simple. The smaller is 2.2 cm and thick walled, but with no convincing internal solid or vascular elements, and might be a corpus luteum. 3. Trace free fluid adjacent to the right  ovary.   Electronically Signed By: Odessa Fleming M.D. On: 12/17/2014 08:58          US OB Comp Less 14 Wks (Final result) Result time: 12/17/14 08:58:40   Procedure changed from US Pelvis Complete      Final result by Rad Results In Interface (12/17/14 08:58:40)   Narrative:   CLINICAL DATA: 34 year old female with right lower quadrant pelvic pain and bleeding since this morning. Quantitative beta HCG 1504. Gestational age by LMP 4 weeks 2 days. Initial encounter.  EXAM: OBSTETRIC <14 WK [place] AND TRANSVAGINAL OB US  TECHNIQUE: Both transabdominal and transvaginal ultrasound examinations were performed for complete evaluation of the gestation as well as the maternal uterus, adnexal regions, and pelvic cul-de-sac. Transvaginal technique was performed to assess early pregnancy.  COMPARISON: None relevant; second trimester OB ultrasound 09/24/2013.  FINDINGS: Intrauterine gestational sac: Not visualize  Yolk sac: Not visualize  Embryo: Not visualize  Cardiac Activity: Not detected  Maternal uterus/adnexae: The endometrium measures 1.8 cm in thickness but appears bland. The uterus measures 8.0 x 6.3 x 5.4 cm.  There is a large 6.5 cm simple appearing cyst in the region of the right adnexa (image 55). No solid or vascular elements. Superimposed more complex appearing thick walled cyst measuring up to 2.2 cm (image 70). This has only mild peripheral vascularity (image 69) and no convincing internal vascularity.  The left ovary measures 2.2 x 1.4 x 1.5 cm and is unremarkable. There is a small volume of free fluid adjacent to the right adnexa.  IMPRESSION: 1. No IUP identified. Differential considerations include failed IUP, ectopic (right adnexa), and early/occult IUP (felt least likely given current quantitative beta HCG level). 2. Negative left ovary. Two right adnexal cysts,  the larger is 6.5 cm and simple. The smaller is 2.2 cm and thick walled, but with  no convincing internal solid or vascular elements, and might be a corpus luteum. 3. Trace free fluid adjacent to the right ovary.   Electronically Signed By: Odessa FlemingH Hall M.D. On: 12/17/2014 08:58   Patient is still having central suprapubic abdominal pain. No heavy bleeding at this time. Ultrasound shows no identified IUP with cyst in the right adnexa. Concern for possible early ectopic versus SAB. Spoke with Dr. Debroah LoopArnold with OB/GYN who recommended follow-up hCG in 48 hours.  Discussed findings with patient. She was given pain control and discharged home.  Gwyneth SproutWhitney Laurann Mcmorris, MD 12/17/14 [phone]

## 2014-12-17 NOTE — ED Notes (Signed)
Patient states that she is unable to produce sample for Urinalysis.

## 2014-12-20 ENCOUNTER — Encounter (HOSPITAL_COMMUNITY): Payer: Self-pay | Admitting: *Deleted

## 2014-12-20 ENCOUNTER — Telehealth (HOSPITAL_COMMUNITY): Payer: Self-pay | Admitting: *Deleted

## 2014-12-20 ENCOUNTER — Inpatient Hospital Stay (HOSPITAL_COMMUNITY)
Admission: AD | Admit: 2014-12-20 | Discharge: 2014-12-20 | Disposition: A | Discharge status: Home / Self Care | Payer: Medicaid Other | Source: Ambulatory Visit | Attending: Obstetrics & Gynecology | Admitting: Obstetrics & Gynecology

## 2014-12-20 DIAGNOSIS — R52 Pain, unspecified: Secondary | ICD-10-CM

## 2014-12-20 DIAGNOSIS — O3680X0 Pregnancy with inconclusive fetal viability, not applicable or unspecified: Secondary | ICD-10-CM

## 2014-12-20 DIAGNOSIS — O0281 Inappropriate change in quantitative human chorionic gonadotropin (hCG) in early pregnancy: Secondary | ICD-10-CM | POA: Diagnosis present

## 2014-12-20 HISTORY — DX: Anemia, unspecified: D64.9

## 2014-12-20 HISTORY — DX: Gonococcal infection, unspecified: A54.9

## 2014-12-20 LAB — HCG, QUANTITATIVE, PREGNANCY: hCG, Beta Chain, Quant, S: 4110 m[IU]/mL — ABNORMAL HIGH (ref <5)

## 2014-12-20 LAB — INFLUENZA PANEL BY PCR (TYPE A & B)
H1N1FLUPCR: NOT DETECTED
INFLAPCR: NEGATIVE
INFLBPCR: NEGATIVE

## 2014-12-20 NOTE — MAU Provider Note (Signed)
S:  Elizabeth Mendoza is a 34 y.o. female [id number] at 6276w5d presenting to MAU for a follow up beta hcg level. Currently she denies pain or bleeding.   She denies sore throat, cough, chest pain, HA + body aches that started yesterday. She has not taken anything for the aches. She is unsure whether she has a fever.   O:  GENERAL: Well-developed, well-nourished female in no acute distress.  LUNGS: Effort normal SKIN: Warm, dry and without erythema PSYCH: Normal mood and affect  Filed Vitals:   12/20/14 0932 12/20/14 0936 12/20/14 0939  BP:   124/87  Pulse:   118  Temp:   98.1 F (36.7 C)  TempSrc:   Oral  Resp:   18  Height: 5\' 3"  (1.6 m)    Weight: 200 lb 6.4 oz (90.901 kg)    SpO2:  100% 100%   Results for orders placed or performed during the hospital encounter of 12/20/14 (from the past 48 hour(s))  hCG, quantitative, pregnancy     Status: Abnormal   Collection Time: 12/20/14  9:25 AM  Result Value Ref Range   hCG, Beta Chain, Quant, S 4110 (H) <5 mIU/mL    Comment:          GEST. AGE      CONC.  (mIU/mL)   <=1 WEEK        5 - 50     2 WEEKS       50 - 500     3 WEEKS       100 - 10,000     4 WEEKS     1,000 - 30,000     5 WEEKS     3,500 - 115,000   6-8 WEEKS     12,000 - 270,000    12 WEEKS     15,000 - 220,000        FEMALE AND NON-PREGNANT FEMALE:     LESS THAN 5 mIU/mL   Influenza panel by PCR (type A & B, H1N1)     Status: None   Collection Time: 12/20/14 11:07 AM  Result Value Ref Range   Influenza A By PCR NEGATIVE NEGATIVE   Influenza B By PCR NEGATIVE NEGATIVE   H1N1 flu by pcr NOT DETECTED NOT DETECTED    Comment:        The Xpert Flu assay (FDA approved for nasal aspirates or washes and nasopharyngeal swab specimens), is intended as an aid in the diagnosis of influenza and should not be used as a sole basis for treatment. [facility]     MDM:  Beta quant 11/16: 1504 Beta quant 11/19: 4110 RN rechecked temp at discharge and  it read 100.8> flu swab obtained.    A:  1. Elevated level of quantitative hCG for gestational age in early pregnancy   2. Body aches   3. Pregnancy of unknown anatomic location      P:  Discharge home in stable condition Patient notified of negative flu swab US in one week for viability Patient to return to MAU as needed, if symptoms worsen Pelvic rest List of safe medications she can use in pregnancy given. Ok to use tylenol as directed on the bottle.    Duane LopeJennifer I Orton Capell, NP 12/20/2014 10:53 AM

## 2014-12-20 NOTE — MAU Note (Signed)
Pt taken to rm to rest and her pt comfort. Status discussed with NP

## 2014-12-20 NOTE — MAU Note (Signed)
Pt feels horrible, from the "flu"; started last night- body is hot and feels weak. Denies cough or sore throat.  States no longer having pain in abd. Has not checked temp

## 2014-12-20 NOTE — MAU Note (Signed)
No other symptoms, denies cough or sore throat, just feels achy and temp is now increasing.  Plan discussed with NP, flu swab obtained, will call pt with results.

## 2014-12-22 ENCOUNTER — Encounter (HOSPITAL_COMMUNITY): Payer: Self-pay | Admitting: *Deleted

## 2014-12-22 ENCOUNTER — Inpatient Hospital Stay (HOSPITAL_COMMUNITY)
Admission: AD | Admit: 2014-12-22 | Discharge: 2014-12-22 | Disposition: A | Discharge status: Home / Self Care | Payer: Medicaid Other | Source: Ambulatory Visit | Attending: Family Medicine | Admitting: Family Medicine

## 2014-12-22 DIAGNOSIS — F17210 Nicotine dependence, cigarettes, uncomplicated: Secondary | ICD-10-CM | POA: Diagnosis not present

## 2014-12-22 DIAGNOSIS — L02412 Cutaneous abscess of left axilla: Secondary | ICD-10-CM | POA: Diagnosis present

## 2014-12-22 DIAGNOSIS — L02422 Furuncle of left axilla: Secondary | ICD-10-CM | POA: Insufficient documentation

## 2014-12-22 DIAGNOSIS — L02429 Furuncle of limb, unspecified: Secondary | ICD-10-CM

## 2014-12-22 MED ORDER — CEPHALEXIN 500 MG PO CAPS: 500.0000 mg | ORAL_CAPSULE | Freq: Four times a day (QID) | ORAL | Status: DC

## 2014-12-22 NOTE — MAU Note (Signed)
In to discuss US, pt states, radiology just called and scheduled appt for Friday.  Her ride is here and anxious to leave.   Pt wanting to leave

## 2014-12-22 NOTE — MAU Note (Signed)
Lump in upper left arm first noted after leaving on Sat night.  Busted open while in lobby, foul d/c noted.

## 2014-12-22 NOTE — MAU Provider Note (Signed)
Chief Complaint: Recurrent Skin Infections   First Provider Initiated Contact with Patient 12/22/14 1050      SUBJECTIVE HPI: Elizabeth Mendoza is a 34 y.o. [id number] at 162w0d by LMP who presents to maternity admissions reporting that she has an abscess to her L axilla. The abscess started about 3 days ago and has gotten worse. She states that it was about the size of a baseball. She reports that she tried to lance the area at home. As she was driving over to the hospital today, the abscess burst. She  Has copious foul smelling bloody purulent discharge from area. She has had many abscesses in her axilla and groin and states normally they resolve on their own. She took a Vicodin for pain last night. Currently, she states the pain has resolved since the abscess has drained.   She states she had some vaginal spotting yesterday, but it has resolved. She currently denies vaginal bleeding, vaginal itching/burning, urinary symptoms, h/a, dizziness, n/v, or fever/chills. She deferred an ultra sound today, because she states she has one scheduled for Friday and her ride is waiting on her.       HPI  Past Medical History  Diagnosis Date  . No pertinent past medical history   . Shortness of breath   . Ulcer   . Hernia of abdominal wall   . Anxiety   . Depression   . Skin abnormalities     Hiradenitis suppurativa  . Tooth decay   . Anemia   . Gonorrhea    Past Surgical History  Procedure Laterality Date  . Induced abortion    . Dilation and curettage of uterus     Social History   Social History  . Marital Status: Single    Spouse Name: N/A  . Number of Children: N/A  . Years of Education: N/A   Occupational History  . Not on file.   Social History Main Topics  . Smoking status: Current Some Day Smoker -- 15 years    Types: Cigarettes  . Smokeless tobacco: Never Used  . Alcohol Use: Yes     Comment: denies  . Drug Use: No  . Sexual Activity: Not on file   Other Topics Concern  . Not  on file   Social History Narrative   No current facility-administered medications on file prior to encounter.   No current outpatient prescriptions on file prior to encounter.   No Known Allergies  ROS:  Review of Systems  Constitutional: Negative for fever, chills and fatigue.  HENT: Negative for sinus pressure.   Eyes: Negative for photophobia.  Respiratory: Negative for shortness of breath.   Cardiovascular: Negative for chest pain.  Gastrointestinal: Negative for nausea, vomiting, diarrhea and constipation.  Genitourinary: Negative for dysuria, frequency, flank pain, vaginal bleeding, vaginal discharge, difficulty urinating, genital sores, vaginal pain and pelvic pain.  Musculoskeletal: Negative for neck pain.  Neurological: Negative for dizziness, weakness and headaches.  Psychiatric/Behavioral: Negative.      I have reviewed patient's Past Medical Hx, Surgical Hx, Family Hx, Social Hx, medications and allergies.   Physical Exam  Patient Vitals for the past 24 hrs:  BP Temp Temp src Pulse Resp  12/22/14 1029 105/61 mmHg 98.5 F (36.9 C) Oral 96 18   Constitutional: Well-developed, well-nourished female in no acute distress.  Cardiovascular: normal rate Respiratory: normal effort GI: Abd soft, non-tender. Pos BS x 4 MS: Extremities nontender, no edema, normal ROM Neurologic: Alert and oriented x 4.  GU: Neg  CVAT.   LAB RESULTS No results found for this or any previous visit (from the past 24 hour(s)).  --/--/B POS (01/09 2258)     MAU Management/MDM: Pt status improved with spontaneous drainage of boil of left axillary.  No edema, erythema, or signs of infection, other than foul odor of drainage noted. Pt stable at time of discharge.  ASSESSMENT 1. Furuncle of axillary fold     PLAN Discharge home Warm compresses to area PRN Keflex QID x 7 days Keep scheduled outpatient viability [place] on Friday Return to MAU as needed for emergencies    Medication  List    TAKE these medications        cephALEXin 500 MG capsule  Commonly known as:  KEFLEX  Take 1 capsule (500 mg total) by mouth 4 (four) times daily.           Follow-up Information    Follow up with [facility]   Specialty:  Radiology   Why:  As scheduled on Friday, Return to MAU as needed for emergencies   Contact information:   [address] [id number] mc [place] [address] [phone]      Sharen Counter Certified Nurse-Midwife HPI performed with the assistance of Bradly Chris, NP student 12/22/2014  11:56 AM

## 2014-12-22 NOTE — Discharge Instructions (Signed)

## 2014-12-23 ENCOUNTER — Encounter (HOSPITAL_COMMUNITY): Payer: Self-pay | Admitting: *Deleted

## 2014-12-23 ENCOUNTER — Inpatient Hospital Stay (HOSPITAL_COMMUNITY): Payer: Medicaid Other

## 2014-12-23 ENCOUNTER — Encounter (HOSPITAL_COMMUNITY)
Admission: AD | Disposition: A | Discharge status: Home / Self Care | Payer: Self-pay | Source: Ambulatory Visit | Attending: Obstetrics & Gynecology

## 2014-12-23 ENCOUNTER — Ambulatory Visit (HOSPITAL_COMMUNITY)
Admission: AD | Admit: 2014-12-23 | Discharge: 2014-12-24 | Disposition: A | Discharge status: Home / Self Care | Payer: Medicaid Other | Source: Ambulatory Visit | Attending: Obstetrics & Gynecology | Admitting: Obstetrics & Gynecology

## 2014-12-23 ENCOUNTER — Encounter (HOSPITAL_COMMUNITY): Payer: Self-pay | Admitting: Obstetrics & Gynecology

## 2014-12-23 DIAGNOSIS — R109 Unspecified abdominal pain: Secondary | ICD-10-CM

## 2014-12-23 DIAGNOSIS — O3680X0 Pregnancy with inconclusive fetal viability, not applicable or unspecified: Secondary | ICD-10-CM

## 2014-12-23 DIAGNOSIS — L732 Hidradenitis suppurativa: Secondary | ICD-10-CM | POA: Diagnosis present

## 2014-12-23 DIAGNOSIS — D62 Acute posthemorrhagic anemia: Secondary | ICD-10-CM | POA: Diagnosis not present

## 2014-12-23 DIAGNOSIS — F329 Major depressive disorder, single episode, unspecified: Secondary | ICD-10-CM | POA: Insufficient documentation

## 2014-12-23 DIAGNOSIS — Z3A00 Weeks of gestation of pregnancy not specified: Secondary | ICD-10-CM | POA: Diagnosis not present

## 2014-12-23 DIAGNOSIS — Z302 Encounter for sterilization: Secondary | ICD-10-CM | POA: Diagnosis not present

## 2014-12-23 DIAGNOSIS — F17210 Nicotine dependence, cigarettes, uncomplicated: Secondary | ICD-10-CM | POA: Insufficient documentation

## 2014-12-23 DIAGNOSIS — O001 Tubal pregnancy without intrauterine pregnancy: Principal | ICD-10-CM | POA: Insufficient documentation

## 2014-12-23 DIAGNOSIS — K661 Hemoperitoneum: Secondary | ICD-10-CM | POA: Diagnosis present

## 2014-12-23 DIAGNOSIS — F419 Anxiety disorder, unspecified: Secondary | ICD-10-CM | POA: Insufficient documentation

## 2014-12-23 DIAGNOSIS — O00101 Right tubal pregnancy without intrauterine pregnancy: Secondary | ICD-10-CM

## 2014-12-23 DIAGNOSIS — N83291 Other ovarian cyst, right side: Secondary | ICD-10-CM | POA: Diagnosis not present

## 2014-12-23 DIAGNOSIS — O26899 Other specified pregnancy related conditions, unspecified trimester: Secondary | ICD-10-CM

## 2014-12-23 DIAGNOSIS — O009 Unspecified ectopic pregnancy without intrauterine pregnancy: Secondary | ICD-10-CM | POA: Diagnosis present

## 2014-12-23 HISTORY — PX: LAPAROSCOPY: SHX197

## 2014-12-23 LAB — CBC
HEMATOCRIT: 25.8 % — AB (ref 36.0–46.0)
HEMOGLOBIN: 8.8 g/dL — AB (ref 12.0–15.0)
MCH: 29.1 pg (ref 26.0–34.0)
MCHC: 34.1 g/dL (ref 30.0–36.0)
MCV: 85.4 fL (ref 78.0–100.0)
Platelets: 262 10*3/uL (ref 150–400)
RBC: 3.02 MIL/uL — ABNORMAL LOW (ref 3.87–5.11)
RDW: 14.7 % (ref 11.5–15.5)
WBC: 8.9 10*3/uL (ref 4.0–10.5)

## 2014-12-23 LAB — PREPARE RBC (CROSSMATCH)

## 2014-12-23 SURGERY — LAPAROSCOPY OPERATIVE
Anesthesia: General | Site: Abdomen

## 2014-12-23 MED ORDER — PROMETHAZINE HCL 25 MG/ML IJ SOLN: 6.2500 mg | INTRAMUSCULAR | Status: DC | PRN

## 2014-12-23 MED ORDER — FENTANYL CITRATE (PF) 100 MCG/2ML IJ SOLN
25.0000 ug | INTRAMUSCULAR | Status: DC | PRN
  Administered 2014-12-24 (×3): 50 ug via INTRAVENOUS

## 2014-12-23 MED ORDER — PROPOFOL 10 MG/ML IV BOLUS
INTRAVENOUS | Status: DC | PRN
  Administered 2014-12-23: 190 mg via INTRAVENOUS

## 2014-12-23 MED ORDER — FENTANYL CITRATE (PF) 100 MCG/2ML IJ SOLN
INTRAMUSCULAR | Status: AC
  Filled 2014-12-23: qty 2

## 2014-12-23 MED ORDER — ROCURONIUM BROMIDE 100 MG/10ML IV SOLN
INTRAVENOUS | Status: DC | PRN
  Administered 2014-12-23: 5 mg via INTRAVENOUS
  Administered 2014-12-23: 40 mg via INTRAVENOUS

## 2014-12-23 MED ORDER — PROPOFOL 10 MG/ML IV BOLUS
INTRAVENOUS | Status: AC
  Filled 2014-12-23: qty 20

## 2014-12-23 MED ORDER — BUPIVACAINE HCL (PF) 0.25 % IJ SOLN
INTRAMUSCULAR | Status: DC | PRN
  Administered 2014-12-23: 18 mL

## 2014-12-23 MED ORDER — FENTANYL CITRATE (PF) 250 MCG/5ML IJ SOLN
INTRAMUSCULAR | Status: AC
  Filled 2014-12-23: qty 5

## 2014-12-23 MED ORDER — SODIUM CHLORIDE 0.9 % IV SOLN: Freq: Once | INTRAVENOUS | Status: DC

## 2014-12-23 MED ORDER — NEOSTIGMINE METHYLSULFATE 10 MG/10ML IV SOLN
INTRAVENOUS | Status: AC
  Filled 2014-12-23: qty 1

## 2014-12-23 MED ORDER — LACTATED RINGERS IV BOLUS (SEPSIS)
1000.0000 mL | Freq: Once | INTRAVENOUS | Status: AC
  Administered 2014-12-23: 1000 mL via INTRAVENOUS

## 2014-12-23 MED ORDER — GLYCOPYRROLATE 0.2 MG/ML IJ SOLN
INTRAMUSCULAR | Status: AC
  Filled 2014-12-23: qty 3

## 2014-12-23 MED ORDER — LACTATED RINGERS IV SOLN
INTRAVENOUS | Status: DC | PRN
  Administered 2014-12-23 (×2): via INTRAVENOUS

## 2014-12-23 MED ORDER — CITRIC ACID-SODIUM CITRATE 334-500 MG/5ML PO SOLN
30.0000 mL | Freq: Once | ORAL | Status: AC
  Administered 2014-12-23: 30 mL via ORAL
  Filled 2014-12-23: qty 15

## 2014-12-23 MED ORDER — FAMOTIDINE IN NACL 20-0.9 MG/50ML-% IV SOLN
20.0000 mg | Freq: Once | INTRAVENOUS | Status: AC
  Administered 2014-12-23: 20 mg via INTRAVENOUS
  Filled 2014-12-23: qty 50

## 2014-12-23 MED ORDER — ROCURONIUM BROMIDE 100 MG/10ML IV SOLN
INTRAVENOUS | Status: AC
  Filled 2014-12-23: qty 1

## 2014-12-23 MED ORDER — LACTATED RINGERS IR SOLN
Status: DC | PRN
  Administered 2014-12-23: 3000 mL

## 2014-12-23 MED ORDER — LACTATED RINGERS IV SOLN
INTRAVENOUS | Status: DC | PRN
  Administered 2014-12-23: 22:00:00 via INTRAVENOUS

## 2014-12-23 MED ORDER — NEOSTIGMINE METHYLSULFATE 10 MG/10ML IV SOLN
INTRAVENOUS | Status: DC | PRN
Start: 2014-12-23 — End: 2014-12-23
  Administered 2014-12-23: 5 mg via INTRAVENOUS

## 2014-12-23 MED ORDER — MEPERIDINE HCL 25 MG/ML IJ SOLN: 6.2500 mg | INTRAMUSCULAR | Status: DC | PRN

## 2014-12-23 MED ORDER — ONDANSETRON HCL 4 MG/2ML IJ SOLN
INTRAMUSCULAR | Status: AC
  Filled 2014-12-23: qty 2

## 2014-12-23 MED ORDER — VANCOMYCIN HCL IN DEXTROSE 1-5 GM/200ML-% IV SOLN
1000.0000 mg | Freq: Once | INTRAVENOUS | Status: AC
  Administered 2014-12-23: 1000 mg via INTRAVENOUS
  Filled 2014-12-23: qty 200

## 2014-12-23 MED ORDER — HYDROMORPHONE HCL 1 MG/ML IJ SOLN
1.0000 mg | Freq: Once | INTRAMUSCULAR | Status: AC
  Administered 2014-12-23: 1 mg via INTRAMUSCULAR
  Filled 2014-12-23: qty 1

## 2014-12-23 MED ORDER — MIDAZOLAM HCL 2 MG/2ML IJ SOLN
INTRAMUSCULAR | Status: AC
  Filled 2014-12-23: qty 2

## 2014-12-23 MED ORDER — GLYCOPYRROLATE 0.2 MG/ML IJ SOLN
INTRAMUSCULAR | Status: DC | PRN
  Administered 2014-12-23: .8 mg via INTRAVENOUS

## 2014-12-23 MED ORDER — MIDAZOLAM HCL 2 MG/2ML IJ SOLN
INTRAMUSCULAR | Status: DC | PRN
  Administered 2014-12-23: 2 mg via INTRAVENOUS

## 2014-12-23 MED ORDER — FENTANYL CITRATE (PF) 100 MCG/2ML IJ SOLN
INTRAMUSCULAR | Status: DC | PRN
  Administered 2014-12-23 (×2): 100 ug via INTRAVENOUS
  Administered 2014-12-23: 50 ug via INTRAVENOUS

## 2014-12-23 MED ORDER — DEXAMETHASONE SODIUM PHOSPHATE 10 MG/ML IJ SOLN
INTRAMUSCULAR | Status: DC | PRN
  Administered 2014-12-23: 4 mg via INTRAVENOUS

## 2014-12-23 MED ORDER — SUCCINYLCHOLINE CHLORIDE 20 MG/ML IJ SOLN
INTRAMUSCULAR | Status: DC | PRN
  Administered 2014-12-23: 120 mg via INTRAVENOUS

## 2014-12-23 MED ORDER — ONDANSETRON HCL 4 MG/2ML IJ SOLN
INTRAMUSCULAR | Status: DC | PRN
  Administered 2014-12-23: 4 mg via INTRAVENOUS

## 2014-12-23 MED ORDER — DEXAMETHASONE SODIUM PHOSPHATE 4 MG/ML IJ SOLN
INTRAMUSCULAR | Status: AC
  Filled 2014-12-23: qty 1

## 2014-12-23 SURGICAL SUPPLY — 27 items
APPLICATOR COTTON TIP 6IN STRL (MISCELLANEOUS) ×6 IMPLANT
BLADE SURG 15 STRL LF C SS BP (BLADE) ×1 IMPLANT
BLADE SURG 15 STRL SS (BLADE) ×2
CLOTH BEACON ORANGE TIMEOUT ST (SAFETY) ×3 IMPLANT
DRSG COVADERM PLUS 2X2 (GAUZE/BANDAGES/DRESSINGS) IMPLANT
DRSG OPSITE POSTOP 3X4 (GAUZE/BANDAGES/DRESSINGS) IMPLANT
DURAPREP 26ML APPLICATOR (WOUND CARE) ×3 IMPLANT
GLOVE BIO SURGEON STRL SZ7 (GLOVE) ×3 IMPLANT
GLOVE BIOGEL PI IND STRL 7.0 (GLOVE) ×2 IMPLANT
GLOVE BIOGEL PI INDICATOR 7.0 (GLOVE) ×4
GOWN STRL REUS W/TWL LRG LVL3 (GOWN DISPOSABLE) ×6 IMPLANT
NEEDLE INSUFFLATION 120MM (ENDOMECHANICALS) ×3 IMPLANT
NS IRRIG 1000ML POUR BTL (IV SOLUTION) ×3 IMPLANT
PACK LAPAROSCOPY BASIN (CUSTOM PROCEDURE TRAY) ×3 IMPLANT
PAD POSITIONING PINK XL (MISCELLANEOUS) ×3 IMPLANT
POUCH SPECIMEN RETRIEVAL 10MM (ENDOMECHANICALS) ×3 IMPLANT
SET IRRIG TUBING LAPAROSCOPIC (IRRIGATION / IRRIGATOR) ×3 IMPLANT
SHEARS HARMONIC ACE PLUS 36CM (ENDOMECHANICALS) ×3 IMPLANT
SUT VICRYL 0 ENDOLOOP (SUTURE) IMPLANT
SUT VICRYL 0 UR6 27IN ABS (SUTURE) ×3 IMPLANT
SUT VICRYL 4-0 PS2 18IN ABS (SUTURE) ×3 IMPLANT
TOWEL OR 17X24 6PK STRL BLUE (TOWEL DISPOSABLE) ×6 IMPLANT
TRAY FOLEY CATH SILVER 14FR (SET/KITS/TRAYS/PACK) ×3 IMPLANT
TROCAR BALLN 12MMX100 BLUNT (TROCAR) IMPLANT
TROCAR XCEL NON-BLD 11X100MML (ENDOMECHANICALS) ×3 IMPLANT
TROCAR XCEL NON-BLD 5MMX100MML (ENDOMECHANICALS) ×6 IMPLANT
WATER STERILE IRR 1000ML POUR (IV SOLUTION) IMPLANT

## 2014-12-23 NOTE — Progress Notes (Signed)
Dr Penne LashLeggett collected wound culture from left armpit drainage.  Notified OR of left armpit with foul odor, purulent drainage.

## 2014-12-23 NOTE — MAU Note (Signed)
Pt states all over abd pain started about 30 min ago.  Vaginal spotting for about 1 week.

## 2014-12-23 NOTE — MAU Provider Note (Addendum)
History     CSN: [id number]  Arrival date and time: 12/23/14 4098   First Provider Initiated Contact with Patient 12/23/14 1942      Chief Complaint  Patient presents with  . Abdominal Pain   HPI [name] is a 34 y.o. [id number] at [redacted]w[redacted]d who presents to MAU today by EMS with complaint of abdominal pain. The patient was seen at [facility] 12/17/14 and had IUGS without YS or FP. She also had 2 ovarian cysts. She states that pain is worse in the LLQ although diffuse in the abdomen. She was taking percocet with some relief, but ran out recently. She has not taken anything for pain today. She states that she is spotting lightly today as well. She denies heavy bleeding or fever. She has had nausea "due to pain" without vomiting.   OB History    Gravida Para Term Preterm AB TAB SAB Ectopic Multiple Living   0 0 0 5      Past Medical History  Diagnosis Date  . No pertinent past medical history   . Shortness of breath   . Ulcer   . Hernia of abdominal wall   . Anxiety   . Depression   . Skin abnormalities     Hiradenitis suppurativa  . Tooth decay   . Anemia   . Gonorrhea     Past Surgical History  Procedure Laterality Date  . Induced abortion    . Dilation and curettage of uterus      Family History  Problem Relation Age of Onset  . Anesthesia problems Neg Hx   . Diabetes Father     Social History  Substance Use Topics  . Smoking status: Current Some Day Smoker -- 15 years    Types: Cigarettes  . Smokeless tobacco: Never Used  . Alcohol Use: Yes     Comment: denies    Allergies: No Known Allergies  Prescriptions prior to admission  Medication Sig Dispense Refill Last Dose  . cephALEXin (KEFLEX) 500 MG capsule Take 1 capsule (500 mg total) by mouth 4 (four) times daily. 28 capsule 0     Review of Systems  Constitutional: Negative for fever and malaise/fatigue.  Gastrointestinal: Positive for nausea and abdominal pain. Negative for vomiting.   Genitourinary:       + spotting   Physical Exam   Blood pressure 120/76, pulse 82, temperature 97.5 F (36.4 C), temperature source Oral, resp. rate 18, last menstrual period 11/17/2014, SpO2 100 %, not currently breastfeeding.  Physical Exam  Nursing note and vitals reviewed. Constitutional: She is oriented to person, place, and time. She appears well-developed and well-nourished. No distress.  HENT:  Head: Normocephalic and atraumatic.  Cardiovascular: Normal rate.   Respiratory: Effort normal.  GI: Soft. She exhibits no distension and no mass. There is tenderness (diffuse abdominal tenderness to palpation most prominent in the LLQ). There is no rebound and no guarding.  Neurological: She is alert and oriented to person, place, and time.  Skin: Skin is warm and dry. No erythema.  Psychiatric: She has a normal mood and affect.   CV:  RRR Lungs:  CTAB Left axilla--infected hair follicle drained.  Will give IV antibiotics due to active skin infection.   MAU Course  Procedures None  MDM CBC and [place] today 1 mg Dilaudid given for pain - patient appears significantly more comfortable within 15 minutes 2030 - Patient in [place]. Care turned over to [facility],  CNM  Marny LowensteinJulie N Wenzel, PA-C  12/23/2014, 8:33 PM  Assessment and Plan   2050 Dr. Penne LashLeggett on unit and assumes care of patient.  Eino FarberWalidah Kennith GainN Karim, CNM  Pt seen and examined.  Pt has abdominal pain but stable.  US reviewed with Dr. Annia Beltrew Davis, radiology.  Pt has was is presumed to be a ruptured right ectopic pregnancy with hemoperitoneum.  Pt also has several simple cysts in right ovary with one approximately 8 cm.  Pt has undesired fertility and asking for a tubal ligation on the left side.  She has 5 children.    Left folliculitis being treated with Keflex.   Pt seen and examined.  Pt consented for Laparoscopy, right salpingectomy, and removal of ectopic pregnancy, left tubal ligation, possible cystectomy or drainage or ovarian  cysts.  Risks include but not limited to bleeding, infection, damage to intrabdominal organs, complications from anesthesia.  Pt has not had abdominal surgery in the past.    All labs pedning as she is a "difficult stick" per Engineer, manufacturingN staff. Will cross for blood. OR call team on the way to hospital.    Lesly DukesLEGGETT,Aaiden Depoy H., MD 12/23/2014 9:17 PM

## 2014-12-23 NOTE — Transfer of Care (Signed)
Immediate Anesthesia Transfer of Care Note  Patient: Elizabeth Mendoza  Procedure(s) Performed: Procedure(s): LAPAROSCOPY OPERATIVE, Right Salpingectomy with Removal Ectopic Pregnancy, Fulguration Left Fallopian Tube, Cystotomy Right Ovarian Cyst (N/A)  Patient Location: PACU  Anesthesia Type:General  Level of Consciousness: awake, alert  and oriented  Airway & Oxygen Therapy: Patient Spontanous Breathing and Patient connected to nasal cannula oxygen  Post-op Assessment: Report given to RN and Post -op Vital signs reviewed and stable  Post vital signs: Reviewed and stable  Last Vitals:  Filed Vitals:   12/23/14 1941 12/23/14 2135  BP: 120/76 122/70  Pulse: 82 88  Temp: 36.4 C 37.1 C  Resp: 18 20    Complications: No apparent anesthesia complications

## 2014-12-23 NOTE — Brief Op Note (Signed)
12/23/2014  11:33 PM  PATIENT:  Elizabeth Mendoza  34 y.o. female  PRE-OPERATIVE DIAGNOSIS:  Ruptured Right Ectopic Pregnancy, Undesired fertility, Simple right ovarian cysts  POST-OPERATIVE DIAGNOSIS:  Same  PROCEDURE:  Procedure(s): LAPAROSCOPY OPERATIVE, Right Salpingectomy with Removal Ectopic Pregnancy, Fulguration Left Fallopian Tube, Cystotomy Right Ovarian Cyst (N/A)  SURGEON:  Surgeon(s) and Role:    * Lesly DukesKelly H Frederika Hukill, MD - Primary  ASSISTANTS: RNfA LeAnne   ANESTHESIA:   general  EBL: 3000 cc in OR BLOOD ADMINISTERED:none  DRAINS: none   LOCAL MEDICATIONS USED:  MARCAINE     SPECIMEN: Right Fallopian tube with ectopic pregnancy  DISPOSITION OF SPECIMEN:  PATHOLOGY  COUNTS:  YES  TOURNIQUET:  * No tourniquets in log *  DICTATION: .Note written in EPIC  PLAN OF CARE: Admit for overnight observation  PATIENT DISPOSITION:  PACU - hemodynamically stable.   Delay start of Pharmacological VTE agent (>24hrs) due to surgical blood loss or risk of bleeding: yes   INDICATIONS: 34 y.o. [id number] at 4161w1d here for with ruptured ectopic pregnancy. On exam, she had stable vital signs, and an acute abdomen. Hgb 8.8, blood type Rh+. Patient was counseled regarding need for laparoscopic salpingectomy. Risks of surgery including bleeding which may require transfusion or reoperation, infection, injury to bowel or other surrounding organs, need for additional procedures including laparotomy and other postoperative/anesthesia complications were explained to patient.  Written informed consent was obtained.  Patient also desired sterilization of left fallopian tube.  Patient also noted to have 3 simple cysts on her right ovary with the largest being 7-8 cm.    FINDINGS:  large amount of hemoperitoneum estimated to be about 400 of blood and clots.  Dilated right fallopian tube containing ectopic gestation. Small normal appearing uterus, normal left fallopian tube, right ovary contains 3  simple ovarian cysts.  2x2, 2x2, and 7x5   PROCEDURE IN DETAIL:  The patient was taken to the operating room where general anesthesia was administered and was found to be adequate.  She was placed in the dorsal lithotomy position, and was prepped and draped in a sterile manner.  A Foley catheter was inserted into her bladder and attached to constant drainage and a uterine manipulator was then advanced into the uterus .  After an adequate timeout was performed, attention was then turned to the patient's abdomen where a 10-mm skin incision was made on the umbilical fold.  The 10/11XL Optivew trocar was used to enter the abdomen.   Intraperitoneal placement was confirmed and an adequate pneumoperitoneum was obtained.   A survey of the patient's pelvis and abdomen revealed the findings as above.  Two 5-mm right lower quadrant ports were placed under direct visualization.  The 5-mm Nezhat suction irrigator was then used to suction the hemoperitoneum and irrigate the pelvis.  Attention was then turned to the right fallopian tube which was grasped and ligated from the underlying mesosalpinx and uterine attachment using the Harmonic scalpel instrument.  Good hemostasis was noted.  The specimen was placed in an EndoCatch bag and removed from the abdomen intact.  Bipolar was used to fulgurate the left fallopian tube in 4 contiguous places.  The 3 simple ovarian cysts were drained and fluid was noted to be straw like.  The abdomen was desufflated, and all instruments were removed.  The fascial incisions of the umbilical 10-mm site were reapproximated with 0 Vicryl figure-of-eight stiches.  The umbilical skin incision was closed with 3-0 Vicryl in a subcuticular fashion.  Dermabond  was used to re approximate the 5-mm incisions as well as over the umbilical skin incision.   The patient tolerated the procedures well.  All instruments, needles, and sponge counts were correct x 2. The patient was taken to the recovery room in  stable condition.   Given the amount of blood loss, patient will be observed overnight in the hospital.  Will discharge in the morning if she remains stable.  Will recheck hemoglobin later tonight and ascertain need for possible transfusion.  Crist Kruszka H. 12/23/2014 11:36 PM

## 2014-12-23 NOTE — Anesthesia Postprocedure Evaluation (Signed)
Anesthesia Post Note  Patient: Elizabeth Mendoza  Procedure(s) Performed: Procedure(s) (LRB): LAPAROSCOPY OPERATIVE, Right Salpingectomy with Removal Ectopic Pregnancy, Fulguration Left Fallopian Tube, Cystotomy Right Ovarian Cyst (N/A)  Patient location during evaluation: PACU Anesthesia Type: General Level of consciousness: awake and alert Pain management: pain level controlled Vital Signs Assessment: post-procedure vital signs reviewed and stable Respiratory status: spontaneous breathing, nonlabored ventilation, respiratory function stable and patient connected to nasal cannula oxygen Cardiovascular status: blood pressure returned to baseline and stable Postop Assessment: No signs of nausea or vomiting Anesthetic complications: no    Last Vitals:  Filed Vitals:   12/23/14 1941 12/23/14 2135  BP: 120/76 122/70  Pulse: 82 88  Temp: 36.4 C 37.1 C  Resp: 18 20    Last Pain:  Filed Vitals:   12/23/14 2202  PainSc: 8                  Rhyland Hinderliter

## 2014-12-23 NOTE — Anesthesia Preprocedure Evaluation (Signed)
Anesthesia Evaluation  Patient identified by MRN, date of birth, ID band Patient awake    Reviewed: Allergy & Precautions, NPO status   Airway Mallampati: II   Neck ROM: Full    Dental  (+) Teeth Intact, Dental Advisory Given   Pulmonary Current Smoker,    breath sounds clear to auscultation       Cardiovascular negative cardio ROS   Rhythm:Regular  Pulse 82   Neuro/Psych Anxiety Depression negative neurological ROS     GI/Hepatic Neg liver ROS,   Endo/Other  negative endocrine ROS  Renal/GU negative Renal ROS     Musculoskeletal negative musculoskeletal ROS (+)   Abdominal (+)  Abdomen: soft.    Peds  Hematology 8/25   Anesthesia Other Findings   Reproductive/Obstetrics ECTOPIC                             Anesthesia Physical Anesthesia Plan  ASA: II and emergent  Anesthesia Plan: General   Post-op Pain Management:    Induction: Intravenous, Rapid sequence and Cricoid pressure planned  Airway Management Planned: Oral ETT  Additional Equipment:   Intra-op Plan:   Post-operative Plan: Extubation in OR  Informed Consent: I have reviewed the patients History and Physical, chart, labs and discussed the procedure including the risks, benefits and alternatives for the proposed anesthesia with the patient or authorized representative who has indicated his/her understanding and acceptance.     Plan Discussed with:   Anesthesia Plan Comments:         Anesthesia Quick Evaluation

## 2014-12-23 NOTE — Anesthesia Procedure Notes (Signed)
Procedure Name: Intubation Date/Time: 12/23/2014 10:10 PM Performed by: Janeece AgeeWRAPE, Kelsha Older W Pre-anesthesia Checklist: Patient identified, Emergency Drugs available, Suction available, Patient being monitored and Timeout performed Patient Re-evaluated:Patient Re-evaluated prior to inductionOxygen Delivery Method: Circle system utilized Preoxygenation: Pre-oxygenation with 100% oxygen Intubation Type: IV induction, Rapid sequence and Cricoid Pressure applied Laryngoscope Size: Mac and 3 Grade View: Grade II Tube size: 7.0 mm Number of attempts: 1 Airway Equipment and Method: Stylet Placement Confirmation: ETT inserted through vocal cords under direct vision,  positive ETCO2 and breath sounds checked- equal and bilateral Secured at: 20 cm Tube secured with: Tape Dental Injury: Teeth and Oropharynx as per pre-operative assessment

## 2014-12-24 ENCOUNTER — Encounter (HOSPITAL_COMMUNITY): Payer: Self-pay | Admitting: *Deleted

## 2014-12-24 DIAGNOSIS — K661 Hemoperitoneum: Secondary | ICD-10-CM | POA: Diagnosis present

## 2014-12-24 DIAGNOSIS — L732 Hidradenitis suppurativa: Secondary | ICD-10-CM | POA: Diagnosis present

## 2014-12-24 DIAGNOSIS — D62 Acute posthemorrhagic anemia: Secondary | ICD-10-CM | POA: Diagnosis present

## 2014-12-24 DIAGNOSIS — O00101 Right tubal pregnancy without intrauterine pregnancy: Secondary | ICD-10-CM

## 2014-12-24 LAB — COMPREHENSIVE METABOLIC PANEL
ALK PHOS: 47 U/L (ref 38–126)
ALT: 13 U/L — ABNORMAL LOW (ref 14–54)
ANION GAP: 5 (ref 5–15)
AST: 12 U/L — ABNORMAL LOW (ref 15–41)
Albumin: 2.8 g/dL — ABNORMAL LOW (ref 3.5–5.0)
BUN: 8 mg/dL (ref 6–20)
CALCIUM: 8.6 mg/dL — AB (ref 8.9–10.3)
CO2: 26 mmol/L (ref 22–32)
Chloride: 104 mmol/L (ref 101–111)
Creatinine, Ser: 0.46 mg/dL (ref 0.44–1.00)
GFR calc non Af Amer: >60 mL/min (ref >60)
Glucose, Bld: 116 mg/dL — ABNORMAL HIGH (ref 65–99)
POTASSIUM: 4 mmol/L (ref 3.5–5.1)
SODIUM: 135 mmol/L (ref 135–145)
TOTAL PROTEIN: 6.1 g/dL — AB (ref 6.5–8.1)
Total Bilirubin: 0.4 mg/dL (ref 0.3–1.2)

## 2014-12-24 LAB — CBC
HEMATOCRIT: 22.6 % — AB (ref 36.0–46.0)
HEMOGLOBIN: 7.7 g/dL — AB (ref 12.0–15.0)
MCH: 29.2 pg (ref 26.0–34.0)
MCHC: 34.1 g/dL (ref 30.0–36.0)
MCV: 85.6 fL (ref 78.0–100.0)
Platelets: 258 10*3/uL (ref 150–400)
RBC: 2.64 MIL/uL — AB (ref 3.87–5.11)
RDW: 14 % (ref 11.5–15.5)
WBC: 12.2 10*3/uL — AB (ref 4.0–10.5)

## 2014-12-24 LAB — MRSA PCR SCREENING: MRSA by PCR: NEGATIVE

## 2014-12-24 MED ORDER — DOXYCYCLINE HYCLATE 100 MG PO CAPS: 100.0000 mg | ORAL_CAPSULE | Freq: Two times a day (BID) | ORAL | Status: DC

## 2014-12-24 MED ORDER — FERROUS SULFATE 325 (65 FE) MG PO TABS: 325.0000 mg | ORAL_TABLET | Freq: Two times a day (BID) | ORAL | Status: DC

## 2014-12-24 MED ORDER — DOXYCYCLINE HYCLATE 100 MG IV SOLR
100.0000 mg | Freq: Two times a day (BID) | INTRAVENOUS | Status: DC
  Administered 2014-12-24: 100 mg via INTRAVENOUS
  Filled 2014-12-24 (×3): qty 100

## 2014-12-24 MED ORDER — HYDROMORPHONE HCL 1 MG/ML IJ SOLN
1.0000 mg | INTRAMUSCULAR | Status: DC | PRN
Start: 2014-12-24 — End: 2014-12-24

## 2014-12-24 MED ORDER — DOCUSATE SODIUM 100 MG PO CAPS: 100.0000 mg | ORAL_CAPSULE | Freq: Two times a day (BID) | ORAL | Status: DC

## 2014-12-24 MED ORDER — FENTANYL CITRATE (PF) 100 MCG/2ML IJ SOLN
INTRAMUSCULAR | Status: AC
  Filled 2014-12-24: qty 2

## 2014-12-24 MED ORDER — ONDANSETRON HCL 4 MG PO TABS: 4.0000 mg | ORAL_TABLET | Freq: Four times a day (QID) | ORAL | Status: DC | PRN

## 2014-12-24 MED ORDER — ONDANSETRON HCL 4 MG/2ML IJ SOLN: 4.0000 mg | Freq: Four times a day (QID) | INTRAMUSCULAR | Status: DC | PRN

## 2014-12-24 MED ORDER — LACTATED RINGERS IV SOLN
INTRAVENOUS | Status: DC
  Administered 2014-12-24: 06:00:00 via INTRAVENOUS

## 2014-12-24 MED ORDER — ACETAMINOPHEN 325 MG PO TABS: 650.0000 mg | ORAL_TABLET | ORAL | Status: DC | PRN

## 2014-12-24 MED ORDER — OXYCODONE-ACETAMINOPHEN 5-325 MG PO TABS: 1.0000 | ORAL_TABLET | ORAL | Status: DC | PRN

## 2014-12-24 MED ORDER — KETOROLAC TROMETHAMINE 30 MG/ML IJ SOLN
30.0000 mg | Freq: Four times a day (QID) | INTRAMUSCULAR | Status: DC
  Administered 2014-12-24 (×2): 30 mg via INTRAVENOUS
  Filled 2014-12-24 (×2): qty 1

## 2014-12-24 MED ORDER — KETOROLAC TROMETHAMINE 30 MG/ML IJ SOLN: 30.0000 mg | Freq: Four times a day (QID) | INTRAMUSCULAR | Status: DC

## 2014-12-24 MED ORDER — MENTHOL 3 MG MT LOZG: 1.0000 | LOZENGE | OROMUCOSAL | Status: DC | PRN

## 2014-12-24 MED ORDER — SODIUM CHLORIDE 0.9 % IJ SOLN
INTRAMUSCULAR | Status: AC
  Filled 2014-12-24: qty 3

## 2014-12-24 MED ORDER — OXYCODONE-ACETAMINOPHEN 5-325 MG PO TABS
1.0000 | ORAL_TABLET | ORAL | Status: DC | PRN
  Administered 2014-12-24: 1 via ORAL
  Filled 2014-12-24: qty 1

## 2014-12-24 NOTE — Discharge Summary (Signed)
OB Discharge Summary     Patient Name: Elizabeth Mendoza DOB: [birth year] MRN: [id number]  Date of admission: 12/23/2014 Delivering MD: This patient has no babies on file.  Date of discharge: 12/24/2014  Admitting diagnosis: ectopic, 5 wk Ruptured Ectopic Pregnancy Intrauterine pregnancy: 7068w2d     Secondary diagnosis:  Principal Problem:   Hemoperitoneum due to rupture of right tubal ectopic pregnancy Active Problems:   Hidradenitis suppurativa of left axilla   Acute blood loss anemia  Additional problems: obesity     Discharge diagnosis: s/p surgery for Hemoperitoneum due to rupture of right tubal ectopic pregnancy   Hidradenitis suppurativa of left axilla   Acute blood loss anemia                                                           Hospital course: Pt admitted with hemoperitoneum on 12/23/14 from ruptured ectopic pregnancy.  Pt noted to have active infection in left axilla.  Pt underwent laparoscopic right salpingectomy and removal of ectopic pregnancy.  Her left fallopian tube was fulgurated upon patient request.  Three ovarian cysts were drained for patient comfort.  See detail in operative note.  Pt did well POD #1.  Pt ambulated without difficulty, voided, and tolerated her diet.  Pt discharged home in stable condition.  Physical exam  Filed Vitals:   12/24/14 0137 12/24/14 0240 12/24/14 0405 12/24/14 0600  BP: 146/90 119/74 119/73 123/91  Pulse: 89 89 75 83  Temp: 98.4 F (36.9 C) 98.7 F (37.1 C) 98.5 F (36.9 C) 98.7 F (37.1 C)  TempSrc:  Oral Oral Oral  Resp: 16 15 17 18   Height:      Weight:      SpO2: 97% 100% 100% 100%   General: alert, cooperative and no distress Lochia: appropriate Abdomen:  Soft, appropriately tender Incision: Healing well with no significant drainage DVT Evaluation: No evidence of DVT seen on physical exam. No significant calf/ankle edema. Labs: Lab Results  Component Value Date   WBC 12.2* 12/24/2014   HGB 7.7* 12/24/2014    HCT 22.6* 12/24/2014   MCV 85.6 12/24/2014   PLT 258 12/24/2014   CMP Latest Ref Rng 12/24/2014  Glucose 65 - 99 mg/dL 409(W116(H)  BUN 6 - 20 mg/dL 8  Creatinine 1.190.44 - 1.471.00 mg/dL 8.290.46  Sodium 562135 - 130145 mmol/L 135  Potassium 3.5 - 5.1 mmol/L 4.0  Chloride 101 - 111 mmol/L 104  CO2 22 - 32 mmol/L 26  Calcium 8.9 - 10.3 mg/dL 8.6(V8.6(L)  Total Protein 6.5 - 8.1 g/dL 6.1(L)  Total Bilirubin 0.3 - 1.2 mg/dL 0.4  Alkaline Phos 38 - 126 U/L 47  AST 15 - 41 U/L 12(L)  ALT 14 - 54 U/L 13(L)    Discharge instruction: per After Visit Summary and "Baby and Me Booklet".  After visit meds:    Medication List    STOP taking these medications        cephALEXin 500 MG capsule  Commonly known as:  KEFLEX      TAKE these medications        docusate sodium 100 MG capsule  Commonly known as:  COLACE  Take 1 capsule (100 mg total) by mouth 2 (two) times daily.     doxycycline 100 MG capsule  Commonly known  as:  VIBRAMYCIN  Take 1 capsule (100 mg total) by mouth 2 (two) times daily.     ferrous sulfate 325 (65 FE) MG tablet  Take 1 tablet (325 mg total) by mouth 2 (two) times daily with a meal.     oxyCODONE-acetaminophen 5-325 MG tablet  Commonly known as:  PERCOCET/ROXICET  Take 1-2 tablets by mouth every 4 (four) hours as needed for severe pain (moderate to severe pain (when tolerating fluids)).        Diet: routine diet  Activity: Advance as tolerated. Pelvic rest for 6 weeks.   Outpatient follow up:2 weeks  Follow up Visit:No Follow-up on file.  12/24/2014 Lesly Dukes., MD

## 2014-12-24 NOTE — Progress Notes (Signed)
Case Manager spoke with pt's Nurse regarding medication assistance.  Pt will need to be on Doxycycline after discharge and the cost at her pharmacy [facility]) is $45.00.  The pt does not have insurance at this time.  Case Manager spoke with [facility] about the cost of the Doxycycline and it would be $23.20.  Case Manager discussed this with the pt's Nurse and she spoke with the pt and per the pt she can afford to get the medication at the [facility].  CM available to assist as needed.   [phone]

## 2014-12-24 NOTE — Progress Notes (Signed)
Pt discharged to home with boyfriend.  Condition stable.  Pt ambulated to car with RN.  Pt home with AVS, Percocet prescription, letter for work, and [employer] print out [facility].  No equipment for home ordered at discharge.

## 2014-12-24 NOTE — Anesthesia Postprocedure Evaluation (Signed)
Anesthesia Post Note  Patient: Elizabeth Mendoza  Procedure(s) Performed: Procedure(s) (LRB): LAPAROSCOPY OPERATIVE, Right Salpingectomy with Removal Ectopic Pregnancy, Fulguration Left Fallopian Tube, Cystotomy Right Ovarian Cyst (N/A)  Patient location during evaluation: [facility] Anesthesia Type: General Level of consciousness: awake, awake and alert, oriented and patient cooperative Pain management: pain level controlled Vital Signs Assessment: post-procedure vital signs reviewed and stable Respiratory status: spontaneous breathing, nonlabored ventilation and respiratory function stable Cardiovascular status: stable Postop Assessment: No signs of nausea or vomiting Anesthetic complications: no    Last Vitals:  Filed Vitals:   12/24/14 0405 12/24/14 0600  BP: 119/73 123/91  Pulse: 75 83  Temp: 36.9 C 37.1 C  Resp: 17 18    Last Pain:  Filed Vitals:   12/24/14 0617  PainSc: 0-No pain                 Diala Waxman L

## 2014-12-24 NOTE — Discharge Instructions (Signed)
Ruptured Ectopic Pregnancy °An ectopic pregnancy is when the fertilized egg attaches (implants) outside the uterus. Most ectopic pregnancies occur in the fallopian tube. Rarely do ectopic pregnancies occur on the ovary, intestine, pelvis, or cervix. An ectopic pregnancy does not have the ability to develop into a normal, healthy baby.  °A ruptured ectopic pregnancy is one in which the fallopian tube gets torn or bursts and results in internal bleeding. Often there is intense abdominal pain, and sometimes, vaginal bleeding. Having an ectopic pregnancy can be a life-threatening experience. If left untreated, this dangerous condition can lead to a blood transfusion, abdominal surgery, or even death.  °CAUSES  °Damage to the fallopian tubes is the suspected cause in most ectopic pregnancies.  °RISK FACTORS °Depending on your circumstances, the amount of risk of having an ectopic pregnancy will vary. There are 3 categories that may help you identify whether you are potentially at risk. °High Risk °· You have gone through infertility treatment. °· You have had a previous ectopic pregnancy. °· You have had previous tubal surgery. °· You have had previous surgery to have the fallopian tubes tied (tubal ligation). °· You have tubal problems or diseases. °· You have been exposed to DES. DES is a medicine that was used until 1971 and had effects on babies whose mothers took the medicine. °· You become pregnant while using an intrauterine device (IUD) for birth control.  °Moderate Risk °· You have a history of infertility. °· You have a history of a sexually transmitted infection (STI). °· You have a history of pelvic inflammatory disease (PID). °· You have scarring from endometriosis. °· You have multiple sexual partners. °· You smoke.  °Low Risk °· You have had previous pelvic surgery. °· You use vaginal douching. °· You became sexually active before 32 years of age. °SYMPTOMS °An ectopic pregnancy should be suspected in  anyone who has missed a period and has abdominal pain or bleeding. °· You may experience normal pregnancy symptoms, such as: °¨ Nausea. °¨ Tiredness. °¨ Breast tenderness. °· Symptoms that are not normal include: °¨ Pain with intercourse. °¨ Irregular vaginal bleeding or spotting. °¨ Cramping or pain on one side, or in the lower abdomen. °¨ Fast heartbeat. °¨ Passing out while having a bowel movement. °· Symptoms of a ruptured ectopic pregnancy and internal bleeding may include: °¨ Sudden, severe pain in the abdomen and pelvis. °¨ Dizziness or fainting. °¨ Pain in the shoulder area. °DIAGNOSIS  °Tests that may be performed include: °· A pregnancy test. °· An ultrasound. °· Testing the specific level of pregnancy hormone in the bloodstream. °· Taking a sample of uterus tissue (dilation and curettage, D&C). °· Surgery to perform a visual exam of the inside of the abdomen using a lighted tube (laparoscopy). °TREATMENT  °Laparoscopic surgery or abdominal surgery is recommended for a ruptured ectopic pregnancy.  °· The whole fallopian tube may need to be removed (salpingectomy). °· If the tube is not too damaged, the tube may be saved, and the pregnancy will be surgically removed. In time, the tube may still function. °· If you have lost a lot of blood, you may need a blood transfusion. °· You may receive a Rho (D) immune globulin shot if you are Rh negative and the father is Rh positive, or if you do not know the Rh type of the father. This is to prevent problems with any future pregnancy. °SEEK IMMEDIATE MEDICAL CARE IF:  °You have any symptoms of an ectopic or ruptured ectopic pregnancy. This   is a medical emergency. °MAKE SURE YOU: °· Understand these instructions. °· Will watch your condition. °· Will get help right away if you are not doing well or get worse. °  °This information is not intended to replace advice given to you by your health care provider. Make sure you discuss any questions you have with your health  care provider. °  °Document Released: 01/15/2000 Document Revised: 01/22/2013 Document Reviewed: 10/29/2012 °Elsevier Interactive Patient Education ©2016 [employer] ° °

## 2014-12-24 NOTE — Progress Notes (Signed)
Patient admitted through PACU this morning, on arrival she was lethargic but easily arousable. Came in with foley and iv fluid in situ. Incision site CDI and no bleeding on peripad. Made her comfortable in bed and oriented her to the room Offered her some ice chips and something to drink. Call bell placed within reach and will keep monitoring.

## 2014-12-24 NOTE — Addendum Note (Signed)
Addendum  created 12/24/14 0750 by Yolonda KidaAlison L Treyson Axel, CRNA   Modules edited: Clinical Notes   Clinical Notes:  File: [id number]

## 2014-12-26 ENCOUNTER — Ambulatory Visit (HOSPITAL_COMMUNITY): Payer: Self-pay | Attending: Obstetrics and Gynecology

## 2014-12-27 LAB — WOUND CULTURE

## 2014-12-27 LAB — TYPE AND SCREEN
ABO/RH(D): B POS
Antibody Screen: NEGATIVE
Unit division: 0
Unit division: 0
Unit division: 0
Unit division: 0

## 2014-12-29 ENCOUNTER — Telehealth: Payer: Self-pay | Admitting: General Practice

## 2014-12-29 ENCOUNTER — Encounter: Payer: Self-pay | Admitting: Obstetrics & Gynecology

## 2014-12-29 NOTE — Telephone Encounter (Signed)
Per Dr Penne LashLeggett, need to call patient to check on her anemia, healing abscess and recovery from ectopic pregnancy surgery. Called patient and asked how she was doing. Patient states she is doing well and feels fine. Patient reports taking iron daily. Told patient I see where she does not have a follow up appt scheduled yet but I will let the front office know and someone will call her to set that up. Patient verbalized understanding and had no questions.

## 2015-01-07 ENCOUNTER — Telehealth: Payer: Self-pay | Admitting: *Deleted

## 2015-01-07 ENCOUNTER — Ambulatory Visit: Payer: Self-pay | Admitting: Obstetrics & Gynecology

## 2015-01-07 NOTE — Telephone Encounter (Signed)
Elizabeth Mendoza missed an appointment for follow up after ectopic pregnancy/ surgery. [name] [name] and notified her, she would like to reschedule. Informed her registrars will call her with new appt.

## 2015-01-19 ENCOUNTER — Ambulatory Visit: Payer: Self-pay | Admitting: Obstetrics & Gynecology

## 2015-02-06 ENCOUNTER — Encounter (HOSPITAL_COMMUNITY): Payer: Self-pay | Admitting: *Deleted

## 2015-02-06 ENCOUNTER — Inpatient Hospital Stay (HOSPITAL_COMMUNITY)
Admission: AD | Admit: 2015-02-06 | Discharge: 2015-02-06 | Disposition: A | Discharge status: Home / Self Care | Payer: Medicaid Other | Source: Ambulatory Visit | Attending: Obstetrics and Gynecology | Admitting: Obstetrics and Gynecology

## 2015-02-06 DIAGNOSIS — K219 Gastro-esophageal reflux disease without esophagitis: Secondary | ICD-10-CM | POA: Diagnosis not present

## 2015-02-06 DIAGNOSIS — K439 Ventral hernia without obstruction or gangrene: Secondary | ICD-10-CM | POA: Diagnosis not present

## 2015-02-06 DIAGNOSIS — F17210 Nicotine dependence, cigarettes, uncomplicated: Secondary | ICD-10-CM | POA: Diagnosis not present

## 2015-02-06 DIAGNOSIS — B9689 Other specified bacterial agents as the cause of diseases classified elsewhere: Secondary | ICD-10-CM

## 2015-02-06 DIAGNOSIS — F329 Major depressive disorder, single episode, unspecified: Secondary | ICD-10-CM | POA: Insufficient documentation

## 2015-02-06 DIAGNOSIS — F419 Anxiety disorder, unspecified: Secondary | ICD-10-CM | POA: Insufficient documentation

## 2015-02-06 DIAGNOSIS — A499 Bacterial infection, unspecified: Secondary | ICD-10-CM

## 2015-02-06 DIAGNOSIS — R109 Unspecified abdominal pain: Secondary | ICD-10-CM | POA: Diagnosis not present

## 2015-02-06 DIAGNOSIS — N760 Acute vaginitis: Secondary | ICD-10-CM | POA: Diagnosis not present

## 2015-02-06 LAB — URINALYSIS, ROUTINE W REFLEX MICROSCOPIC
Bilirubin Urine: NEGATIVE
Glucose, UA: NEGATIVE mg/dL
Hgb urine dipstick: NEGATIVE
Ketones, ur: NEGATIVE mg/dL
LEUKOCYTES UA: NEGATIVE
NITRITE: NEGATIVE
PH: 6 (ref 5.0–8.0)
Protein, ur: NEGATIVE mg/dL

## 2015-02-06 LAB — WET PREP, GENITAL
Sperm: NONE SEEN
TRICH WET PREP: NONE SEEN
YEAST WET PREP: NONE SEEN

## 2015-02-06 LAB — COMPREHENSIVE METABOLIC PANEL
ALBUMIN: 3.9 g/dL (ref 3.5–5.0)
ALK PHOS: 76 U/L (ref 38–126)
ALT: 10 U/L — ABNORMAL LOW (ref 14–54)
ANION GAP: 8 (ref 5–15)
AST: 12 U/L — ABNORMAL LOW (ref 15–41)
BUN: 18 mg/dL (ref 6–20)
CALCIUM: 9.2 mg/dL (ref 8.9–10.3)
CHLORIDE: 105 mmol/L (ref 101–111)
CO2: 26 mmol/L (ref 22–32)
Creatinine, Ser: 0.7 mg/dL (ref 0.44–1.00)
GFR calc Af Amer: >60 mL/min (ref >60)
GFR calc non Af Amer: >60 mL/min (ref >60)
GLUCOSE: 98 mg/dL (ref 65–99)
POTASSIUM: 3.8 mmol/L (ref 3.5–5.1)
SODIUM: 139 mmol/L (ref 135–145)
Total Bilirubin: 0.4 mg/dL (ref 0.3–1.2)
Total Protein: 7.6 g/dL (ref 6.5–8.1)

## 2015-02-06 LAB — CBC
HCT: 30.9 % — ABNORMAL LOW (ref 36.0–46.0)
HEMOGLOBIN: 10.3 g/dL — AB (ref 12.0–15.0)
MCH: 28.9 pg (ref 26.0–34.0)
MCHC: 33.3 g/dL (ref 30.0–36.0)
MCV: 86.8 fL (ref 78.0–100.0)
Platelets: 290 10*3/uL (ref 150–400)
RBC: 3.56 MIL/uL — ABNORMAL LOW (ref 3.87–5.11)
RDW: 14.8 % (ref 11.5–15.5)
WBC: 9.1 10*3/uL (ref 4.0–10.5)

## 2015-02-06 LAB — POCT PREGNANCY, URINE: Preg Test, Ur: NEGATIVE

## 2015-02-06 MED ORDER — KETOROLAC TROMETHAMINE 60 MG/2ML IM SOLN
60.0000 mg | Freq: Once | INTRAMUSCULAR | Status: AC
  Administered 2015-02-06: 60 mg via INTRAMUSCULAR
  Filled 2015-02-06: qty 2

## 2015-02-06 MED ORDER — METRONIDAZOLE 500 MG PO TABS: 500.0000 mg | ORAL_TABLET | Freq: Two times a day (BID) | ORAL | Status: DC

## 2015-02-06 MED ORDER — RANITIDINE HCL 150 MG PO TABS: 150.0000 mg | ORAL_TABLET | Freq: Two times a day (BID) | ORAL | Status: DC

## 2015-02-06 NOTE — MAU Provider Note (Signed)
History     CSN: [id number]  Arrival date and time: 02/06/15 1947   First Provider Initiated Contact with Patient 02/06/15 2025      Chief Complaint  Patient presents with  . Abdominal Pain   HPI  [name] is a 35 y.o. female [id number] with a past medical history of abdominal wall hernia presenting to MAU with abdominal pain X 2 weeks. The pain is constant; the pain is located right at her umbilicus.  Patient had laparoscopy surgery for removal of right ectopic pregnancy tubal ligation "they burned my tubes." She missed her follow up appointment in the [facility] and never called to reschedule.   Diet recall: Today she ate fried chicken, nothing else after that to eat. Patient says she is addicted to soda and drinks 1/2-1 liter of sprite per day. She eats fast food/ fried food almost daily. She has had problems with her stomach for years and has been told by different MD's that its GERD and or hernia; "I have been told a lot of different things."  She tried antibiotics for the pain, however no pain medication. This did not give her much relief.   Denies abnormal discharge. + odorous vaginal discharge.   OB History    Gravida Para Term Preterm AB TAB SAB Ectopic Multiple Living   '10 5 5 ' 0 '4 3 1 ' 0 0 5      Past Medical History  Diagnosis Date  . No pertinent past medical history   . Shortness of breath   . Ulcer   . Hernia of abdominal wall   . Anxiety   . Depression   . Skin abnormalities     Hiradenitis suppurativa  . Tooth decay   . Anemia   . Gonorrhea     Past Surgical History  Procedure Laterality Date  . Induced abortion    . Dilation and curettage of uterus    . Laparoscopy N/A 12/23/2014    Procedure: LAPAROSCOPY OPERATIVE, Right Salpingectomy with Removal Ectopic Pregnancy, Fulguration Left Fallopian Tube, Cystotomy Right Ovarian Cyst;  Surgeon: Guss Bunde, MD;  Location: [facility];  Service: Gynecology;  Laterality: N/A;    Family History  Problem  Relation Age of Onset  . Anesthesia problems Neg Hx   . Diabetes Father     Social History  Substance Use Topics  . Smoking status: Current Some Day Smoker -- 15 years    Types: Cigarettes  . Smokeless tobacco: Never Used  . Alcohol Use: No     Comment: denies    Allergies: No Known Allergies  Prescriptions prior to admission  Medication Sig Dispense Refill Last Dose  . ferrous sulfate 325 (65 FE) MG tablet Take 1 tablet (325 mg total) by mouth 2 (two) times daily with a meal. 30 tablet 3 Past Week at Unknown time  . docusate sodium (COLACE) 100 MG capsule Take 1 capsule (100 mg total) by mouth 2 (two) times daily. 10 capsule 0   . doxycycline (VIBRAMYCIN) 100 MG capsule Take 1 capsule (100 mg total) by mouth 2 (two) times daily. 14 capsule 0   . oxyCODONE-acetaminophen (PERCOCET/ROXICET) 5-325 MG tablet Take 1-2 tablets by mouth every 4 (four) hours as needed for severe pain (moderate to severe pain (when tolerating fluids)). 30 tablet 0    Results for orders placed or performed during the hospital encounter of 02/06/15 (from the past 48 hour(s))  Urinalysis, Routine w reflex microscopic (not at [facility])     Status: Abnormal  Collection Time: 02/06/15  8:04 PM  Result Value Ref Range   Color, Urine YELLOW YELLOW   APPearance CLEAR CLEAR   Specific Gravity, Urine >1.030 (H) 1.005 - 1.030   pH 6.0 5.0 - 8.0   Glucose, UA NEGATIVE NEGATIVE mg/dL   Hgb urine dipstick NEGATIVE NEGATIVE   Bilirubin Urine NEGATIVE NEGATIVE   Ketones, ur NEGATIVE NEGATIVE mg/dL   Protein, ur NEGATIVE NEGATIVE mg/dL   Nitrite NEGATIVE NEGATIVE   Leukocytes, UA NEGATIVE NEGATIVE    Comment: MICROSCOPIC NOT DONE ON URINES WITH NEGATIVE PROTEIN, BLOOD, LEUKOCYTES, NITRITE, OR GLUCOSE <1000 mg/dL.  Pregnancy, urine POC     Status: None   Collection Time: 02/06/15  8:35 PM  Result Value Ref Range   Preg Test, Ur NEGATIVE NEGATIVE    Comment:        THE SENSITIVITY OF THIS METHODOLOGY IS >24 mIU/mL    Wet prep, genital     Status: Abnormal   Collection Time: 02/06/15  8:40 PM  Result Value Ref Range   Yeast Wet Prep HPF POC NONE SEEN NONE SEEN   Trich, Wet Prep NONE SEEN NONE SEEN   Clue Cells Wet Prep HPF POC PRESENT (A) NONE SEEN   WBC, Wet Prep HPF POC MANY (A) NONE SEEN    Comment: MODERATE BACTERIA SEEN   Sperm NONE SEEN   CBC     Status: Abnormal   Collection Time: 02/06/15  9:15 PM  Result Value Ref Range   WBC 9.1 4.0 - 10.5 K/uL   RBC 3.56 (L) 3.87 - 5.11 MIL/uL   Hemoglobin 10.3 (L) 12.0 - 15.0 g/dL   HCT 30.9 (L) 36.0 - 46.0 %   MCV 86.8 78.0 - 100.0 fL   MCH 28.9 26.0 - 34.0 pg   MCHC 33.3 30.0 - 36.0 g/dL   RDW 14.8 11.5 - 15.5 %   Platelets 290 150 - 400 K/uL  Comprehensive metabolic panel     Status: Abnormal   Collection Time: 02/06/15  9:15 PM  Result Value Ref Range   Sodium 139 135 - 145 mmol/L   Potassium 3.8 3.5 - 5.1 mmol/L   Chloride 105 101 - 111 mmol/L   CO2 26 22 - 32 mmol/L   Glucose, Bld 98 65 - 99 mg/dL   BUN 18 6 - 20 mg/dL   Creatinine, Ser 0.70 0.44 - 1.00 mg/dL   Calcium 9.2 8.9 - 10.3 mg/dL   Total Protein 7.6 6.5 - 8.1 g/dL   Albumin 3.9 3.5 - 5.0 g/dL   AST 12 (L) 15 - 41 U/L   ALT 10 (L) 14 - 54 U/L   Alkaline Phosphatase 76 38 - 126 U/L   Total Bilirubin 0.4 0.3 - 1.2 mg/dL   GFR calc non Af Amer >60 >60 mL/min   GFR calc Af Amer >60 >60 mL/min    Comment: (NOTE) The eGFR has been calculated using the CKD EPI equation. This calculation has not been validated in all clinical situations. eGFR's persistently <60 mL/min signify possible Chronic Kidney Disease.    Anion gap 8 5 - 15    Review of Systems  Constitutional: Negative for fever and chills.  Gastrointestinal: Positive for abdominal pain. Negative for heartburn, nausea, vomiting, diarrhea and constipation.  Genitourinary: Negative for dysuria and urgency.   Physical Exam   Blood pressure 115/73, pulse 77, temperature 97.9 F (36.6 C), temperature source Oral,  resp. rate 20, height '5\' 4"'  (1.626 m), weight 198 lb 2 oz (  89.869 kg), last menstrual period 01/24/2015, SpO2 100 %, not currently breastfeeding.  Physical Exam  Constitutional: She is oriented to person, place, and time. She appears well-developed and well-nourished. No distress.  HENT:  Head: Normocephalic.  Respiratory: Effort normal.  GI: Soft. Normal appearance and bowel sounds are normal. She exhibits no distension, no fluid wave, no abdominal bruit, no ascites and no mass. There is no splenomegaly or hepatomegaly. There is tenderness in the epigastric area and periumbilical area. There is no rigidity, no rebound and no guarding. No hernia.  Genitourinary:  Speculum exam: Vagina - Small amount of creamy discharge, + odor Cervix - No contact bleeding Bimanual exam: Cervix closed, No CMT  Uterus non tender, normal size Adnexa non tender, no masses bilaterally GC/Chlam, wet prep done Chaperone present for exam.  Musculoskeletal: Normal range of motion.  Neurological: She is alert and oriented to person, place, and time.  Skin: Skin is warm. She is not diaphoretic.  Psychiatric: Her behavior is normal.    MAU Course  Procedures  None  MDM  CBC CMP Toradol 60 mg IM Patient rates her pain 0/10 after toradol.   Assessment and Plan    A:  1. Bacterial vaginosis   2. Gastroesophageal reflux disease, esophagitis presence not specified   3. Abdominal pain in female     P:  Discharge home in stable condition RX: Zantac, Flagyl (no alcohol) Strict diet discussed- no fried foods, limit soda Follow up with PCP If symptoms worsen go to [facility] or [place], NP 02/07/2015 12:07 AM

## 2015-02-06 NOTE — MAU Note (Signed)
Had ectopic pregnancy last month and had surgery to remove it and was supposed to follow up on Dec 19th but didn't have a Arts administratorbaby sitter.  Had tubal ligation also. For past 2 weeks, having abd pain in navel area.   No bleeding.  LMP was Dec 24th, lasted 4-5 days.

## 2015-02-06 NOTE — Discharge Instructions (Signed)
Bacterial Vaginosis Bacterial vaginosis is a vaginal infection that occurs when the normal balance of bacteria in the vagina is disrupted. It results from an overgrowth of certain bacteria. This is the most common vaginal infection in women of childbearing age. Treatment is important to prevent complications, especially in pregnant women, as it can cause a premature delivery. CAUSES  Bacterial vaginosis is caused by an increase in harmful bacteria that are normally present in smaller amounts in the vagina. Several different kinds of bacteria can cause bacterial vaginosis. However, the reason that the condition develops is not fully understood. RISK FACTORS Certain activities or behaviors can put you at an increased risk of developing bacterial vaginosis, including:  Having a new sex partner or multiple sex partners.  Douching.  Using an intrauterine device (IUD) for contraception. Women do not get bacterial vaginosis from toilet seats, bedding, swimming pools, or contact with objects around them. SIGNS AND SYMPTOMS  Some women with bacterial vaginosis have no signs or symptoms. Common symptoms include:  Grey vaginal discharge.  A fishlike odor with discharge, especially after sexual intercourse.  Itching or burning of the vagina and vulva.  Burning or pain with urination. DIAGNOSIS  Your health care provider will take a medical history and examine the vagina for signs of bacterial vaginosis. A sample of vaginal fluid may be taken. Your health care provider will look at this sample under a microscope to check for bacteria and abnormal cells. A vaginal pH test may also be done.  TREATMENT  Bacterial vaginosis may be treated with antibiotic medicines. These may be given in the form of a pill or a vaginal cream. A second round of antibiotics may be prescribed if the condition comes back after treatment. Because bacterial vaginosis increases your risk for sexually transmitted diseases, getting  treated can help reduce your risk for chlamydia, gonorrhea, HIV, and herpes. HOME CARE INSTRUCTIONS   Only take over-the-counter or prescription medicines as directed by your health care provider.  If antibiotic medicine was prescribed, take it as directed. Make sure you finish it even if you start to feel better.  Tell all sexual partners that you have a vaginal infection. They should see their health care provider and be treated if they have problems, such as a mild rash or itching.  During treatment, it is important that you follow these instructions:  Avoid sexual activity or use condoms correctly.  Do not douche.  Avoid alcohol as directed by your health care provider.  Avoid breastfeeding as directed by your health care provider. SEEK MEDICAL CARE IF:   Your symptoms are not improving after 3 days of treatment.  You have increased discharge or pain.  You have a fever. MAKE SURE YOU:   Understand these instructions.  Will watch your condition.  Will get help right away if you are not doing well or get worse. FOR MORE INFORMATION  Centers for Disease Control and Prevention, Division of STD Prevention: [employer] American Sexual Health Association (ASHA): www.ashastd.org    This information is not intended to replace advice given to you by your health care provider. Make sure you discuss any questions you have with your health care provider.   Document Released: 01/17/2005 Document Revised: 02/07/2014 Document Reviewed: 08/29/2012 Elsevier Interactive Patient Education 2016 [employer]. Food Choices for Gastroesophageal Reflux Disease, Adult When you have gastroesophageal reflux disease (GERD), the foods you eat and your eating habits are very important. Choosing the right foods can help ease the discomfort of GERD. WHAT  GENERAL GUIDELINES DO I NEED TO FOLLOW?  Choose fruits, vegetables, whole grains, low-fat dairy products, and low-fat meat, fish, and  poultry.  Limit fats such as oils, salad dressings, butter, nuts, and avocado.  Keep a food diary to identify foods that cause symptoms.  Avoid foods that cause reflux. These may be different for different people.  Eat frequent small meals instead of three large meals each day.  Eat your meals slowly, in a relaxed setting.  Limit fried foods.  Cook foods using methods other than frying.  Avoid drinking alcohol.  Avoid drinking large amounts of liquids with your meals.  Avoid bending over or lying down until 2-3 hours after eating. WHAT FOODS ARE NOT RECOMMENDED? The following are some foods and drinks that may worsen your symptoms: Vegetables Tomatoes. Tomato juice. Tomato and spaghetti sauce. Chili peppers. Onion and garlic. Horseradish. Fruits Oranges, grapefruit, and lemon (fruit and juice). Meats High-fat meats, fish, and poultry. This includes hot dogs, ribs, ham, sausage, salami, and bacon. Dairy Whole milk and chocolate milk. Sour cream. Cream. Butter. Ice cream. Cream cheese.  Beverages Coffee and tea, with or without caffeine. Carbonated beverages or energy drinks. Condiments Hot sauce. Barbecue sauce.  Sweets/Desserts Chocolate and cocoa. Donuts. Peppermint and spearmint. Fats and Oils High-fat foods, including [place] fries and potato chips. Other Vinegar. Strong spices, such as black pepper, white pepper, red pepper, cayenne, curry powder, cloves, ginger, and chili powder. The items listed above may not be a complete list of foods and beverages to avoid. Contact your dietitian for more information.   This information is not intended to replace advice given to you by your health care provider. Make sure you discuss any questions you have with your health care provider.   Document Released: 01/17/2005 Document Revised: 02/07/2014 Document Reviewed: 11/21/2012 Elsevier Interactive Patient Education [employer]2016 Elsevier Inc.

## 2015-02-07 LAB — HIV ANTIBODY (ROUTINE TESTING W REFLEX): HIV Screen 4th Generation wRfx: NONREACTIVE

## 2015-02-09 LAB — GC/CHLAMYDIA PROBE AMP (~~LOC~~) NOT AT ARMC
CHLAMYDIA, DNA PROBE: NEGATIVE
NEISSERIA GONORRHEA: NEGATIVE

## 2015-07-30 ENCOUNTER — Encounter (HOSPITAL_BASED_OUTPATIENT_CLINIC_OR_DEPARTMENT_OTHER): Payer: Self-pay | Admitting: Emergency Medicine

## 2015-07-30 ENCOUNTER — Emergency Department (HOSPITAL_BASED_OUTPATIENT_CLINIC_OR_DEPARTMENT_OTHER)
Admission: EM | Admit: 2015-07-30 | Discharge: 2015-07-31 | Disposition: A | Discharge status: Home / Self Care | Payer: Medicaid Other | Attending: Emergency Medicine | Admitting: Emergency Medicine

## 2015-07-30 DIAGNOSIS — F329 Major depressive disorder, single episode, unspecified: Secondary | ICD-10-CM | POA: Diagnosis not present

## 2015-07-30 DIAGNOSIS — J029 Acute pharyngitis, unspecified: Secondary | ICD-10-CM | POA: Insufficient documentation

## 2015-07-30 DIAGNOSIS — F17210 Nicotine dependence, cigarettes, uncomplicated: Secondary | ICD-10-CM | POA: Insufficient documentation

## 2015-07-30 DIAGNOSIS — R208 Other disturbances of skin sensation: Secondary | ICD-10-CM | POA: Diagnosis not present

## 2015-07-30 DIAGNOSIS — R0989 Other specified symptoms and signs involving the circulatory and respiratory systems: Secondary | ICD-10-CM

## 2015-07-30 DIAGNOSIS — H5789 Other specified disorders of eye and adnexa: Secondary | ICD-10-CM

## 2015-07-30 DIAGNOSIS — Z79899 Other long term (current) drug therapy: Secondary | ICD-10-CM | POA: Diagnosis not present

## 2015-07-30 MED ORDER — LORAZEPAM 2 MG/ML IJ SOLN
1.0000 mg | Freq: Once | INTRAMUSCULAR | Status: AC
  Administered 2015-07-31: 1 mg via INTRAMUSCULAR
  Filled 2015-07-30: qty 1

## 2015-07-30 MED ORDER — DEXAMETHASONE SODIUM PHOSPHATE 10 MG/ML IJ SOLN
10.0000 mg | Freq: Once | INTRAMUSCULAR | Status: AC
  Administered 2015-07-31: 10 mg via INTRAMUSCULAR
  Filled 2015-07-30: qty 1

## 2015-07-30 NOTE — ED Notes (Signed)
[facility] and she has had numerous problems since then. The patient reports that she has a sore at the back of her throat and has pain and burning to her eyes today

## 2015-07-30 NOTE — Discharge Instructions (Signed)
Continue taking your acid reflux medications.  Follow up with Gastroenterology for possible EGD for further evaluation of your throat discomfort.  Food Choices for Gastroesophageal Reflux Disease, Adult  When you have gastroesophageal reflux disease (GERD), the foods you eat and your eating habits are very important. Choosing the right foods can help ease the discomfort of GERD.  WHAT GENERAL GUIDELINES DO I NEED TO FOLLOW?  Choose fruits, vegetables, whole grains, low-fat dairy products, and low-fat meat, fish, and poultry.  Limit fats such as oils, salad dressings, butter, nuts, and avocado.  Keep a food diary to identify foods that cause symptoms.  Avoid foods that cause reflux. These may be different for different people.  Eat frequent small meals instead of three large meals each day.  Eat your meals slowly, in a relaxed setting.  Limit fried foods.  Cook foods using methods other than frying.  Avoid drinking alcohol.  Avoid drinking large amounts of liquids with your meals.  Avoid bending over or lying down until 2-3 hours after eating. WHAT FOODS ARE NOT RECOMMENDED?  The following are some foods and drinks that may worsen your symptoms:  Vegetables  Tomatoes. Tomato juice. Tomato and spaghetti sauce. Chili peppers. Onion and garlic. Horseradish.  Fruits  Oranges, grapefruit, and lemon (fruit and juice).  Meats  High-fat meats, fish, and poultry. This includes hot dogs, ribs, ham, sausage, salami, and bacon.  Dairy  Whole milk and chocolate milk. Sour cream. Cream. Butter. Ice cream. Cream cheese.  Beverages  Coffee and tea, with or without caffeine. Carbonated beverages or energy drinks.  Condiments  Hot sauce. Barbecue sauce.  Sweets/Desserts  Chocolate and cocoa. Donuts. Peppermint and spearmint.  Fats and Oils  High-fat foods, including [place] fries and potato chips.  Other  Vinegar. Strong spices, such as black pepper, white pepper, red pepper, cayenne, curry powder,  cloves, ginger, and chili powder.  The items listed above may not be a complete list of foods and beverages to avoid. Contact your dietitian for more information.  This information is not intended to replace advice given to you by your health care provider. Make sure you discuss any questions you have with your health care provider.  Document Released: 01/17/2005 Document Revised: 02/07/2014 Document Reviewed: 11/21/2012  Elsevier Interactive Patient Education [employer]2016 Elsevier Inc.

## 2015-07-30 NOTE — ED Provider Notes (Signed)
CSN: [id number]     Arrival date & time 07/30/15  2214 History   First MD Initiated Contact with Patient 07/30/15 2237     Chief Complaint  Patient presents with  . Sore Throat     (Consider location/radiation/quality/duration/timing/severity/associated sxs/prior Treatment) Patient is a 35 y.o. female presenting with pharyngitis.  Sore Throat This is a new problem. The current episode started more than 1 month ago. The problem occurs constantly. The problem has been gradually worsening. Associated symptoms include coughing (resolved), headaches and a sore throat (resolved). Pertinent negatives include no abdominal pain, chills, congestion, fever, myalgias, nausea, neck pain, visual change or vomiting. Nothing aggravates the symptoms. Treatments tried: GERD medications. The treatment provided moderate relief.   Elizabeth Mendoza is a 35 y.o. female with PMH significant for GERD who presents with gradual onset, constant, improving sore throat x 1 month.  She was seen in May for this same problem.  Had normal EKG, CXR.  Positive d-dimer, negative chest CTA.  She was discharged with omeprazole and amoxicillin.  She states that her symptoms have improved, but it feels like something is stuck in the back of her throat.  She has been eating and drinking normally.  No choking or difficulty breathing.  She also complains of b/l eye burning after having false eyelashes placed last week.  She has tried nothing for her symptoms.  No visual disturbance, eye discharge, eye watering, eye trauma, eye foreign body.  She does not wear contacts. No fevers, CP, SOB, or abdominal pain.   Past Medical History  Diagnosis Date  . No pertinent past medical history   . Shortness of breath   . Ulcer   . Hernia of abdominal wall   . Anxiety   . Depression   . Skin abnormalities     Hiradenitis suppurativa  . Tooth decay   . Anemia   . Gonorrhea    Past Surgical History  Procedure Laterality Date  . Induced abortion     . Dilation and curettage of uterus    . Laparoscopy N/A 12/23/2014    Procedure: LAPAROSCOPY OPERATIVE, Right Salpingectomy with Removal Ectopic Pregnancy, Fulguration Left Fallopian Tube, Cystotomy Right Ovarian Cyst;  Surgeon: Lesly DukesKelly H Leggett, MD;  Location: [facility];  Service: Gynecology;  Laterality: N/A;   Family History  Problem Relation Age of Onset  . Anesthesia problems Neg Hx   . Diabetes Father    Social History  Substance Use Topics  . Smoking status: Current Some Day Smoker -- 15 years    Types: Cigarettes  . Smokeless tobacco: Never Used  . Alcohol Use: No     Comment: denies   OB History    Gravida Para Term Preterm AB TAB SAB Ectopic Multiple Living   10 5 5  0 4 3 1  0 0 5     Review of Systems  Constitutional: Negative for fever and chills.  HENT: Positive for sore throat (resolved). Negative for congestion.   Respiratory: Positive for cough (resolved).   Gastrointestinal: Negative for nausea, vomiting and abdominal pain.  Musculoskeletal: Negative for myalgias and neck pain.  Neurological: Positive for headaches.  All other systems reviewed and are negative.     Allergies  Review of patient's allergies indicates no known allergies.  Home Medications   Prior to Admission medications   Medication Sig Start Date End Date Taking? Authorizing Provider  doxycycline (VIBRA-TABS) 100 MG tablet Take 100 mg by mouth 2 (two) times daily.   Yes Historical Provider,  MD  ferrous sulfate 325 (65 FE) MG tablet Take 1 tablet (325 mg total) by mouth 2 (two) times daily with a meal. 12/24/14   Asiyah Mayra ReelZahra Mikell, MD  metroNIDAZOLE (FLAGYL) 500 MG tablet Take 1 tablet (500 mg total) by mouth 2 (two) times daily. 02/06/15   Duane LopeJennifer I Rasch, NP  ranitidine (ZANTAC) 150 MG tablet Take 1 tablet (150 mg total) by mouth 2 (two) times daily. 02/06/15   Harolyn RutherfordJennifer I Rasch, NP   BP 124/75 mmHg  Pulse 86  Temp(Src) 98.6 F (37 C) (Oral)  Ht 5\' 4"  (1.626 m)  Wt 90.719 kg  BMI 34.31  kg/m2  SpO2 100% Physical Exam  Constitutional: She is oriented to person, place, and time. She appears well-developed and well-nourished.  Non-toxic appearance. She does not have a sickly appearance. She does not appear ill.  HENT:  Head: Normocephalic and atraumatic.  Mouth/Throat: Uvula is midline, oropharynx is clear and moist and mucous membranes are normal. No trismus in the jaw. No oropharyngeal exudate, posterior oropharyngeal edema or posterior oropharyngeal erythema.  Eyes: Conjunctivae, EOM and lids are normal. Pupils are equal, round, and reactive to light. Lids are everted and swept, no foreign bodies found. Right eye exhibits no chemosis, no discharge, no exudate and no hordeolum. No foreign body present in the right eye. Left eye exhibits no chemosis, no discharge, no exudate and no hordeolum. No foreign body present in the left eye. Right conjunctiva is not injected. Left conjunctiva is not injected.  Neck: Normal range of motion. Neck supple.  Normal phonation.   Cardiovascular: Normal rate and regular rhythm.   Pulmonary/Chest: Effort normal and breath sounds normal. No accessory muscle usage or stridor. No respiratory distress. She has no wheezes. She has no rhonchi. She has no rales.  Abdominal: Soft. Bowel sounds are normal. She exhibits no distension. There is no tenderness.  Musculoskeletal: Normal range of motion.  Lymphadenopathy:    She has no cervical adenopathy.  Neurological: She is alert and oriented to person, place, and time.  Speech clear without dysarthria.  Skin: Skin is warm and dry.  Psychiatric: She has a normal mood and affect. Her behavior is normal.    ED Course  Procedures (including critical care time) Labs Review Labs Reviewed - No data to display  Imaging Review No results found. I have personally reviewed and evaluated these images and lab results as part of my medical decision-making.   EKG Interpretation None      MDM   Final  diagnoses:  Sore throat  Globus sensation  Burning sensation in eye   Patient presents with globus sensation.  Hx of GERD.  Taking omeprazole.  Tolerating PO intake without difficulty.  Tolerating secretions.  Normal phonation.  Decadron and Ativan in ED.  Discussed GI follow up for possible EGD.  Recommend visine for burning. No signs of bacterial infection.  Discussed return precautions.  Patient agrees and acknowledges the above plan for discharge.      Cheri FowlerKayla Wladyslawa Disbro, PA-C 07/30/15 2311  Jacalyn LefevreJulie Haviland, MD 07/31/15 [id number]

## 2015-07-31 NOTE — ED Notes (Signed)
Report received from J.Cothren, Charity fundraiserN.  Care assumed at time of d/c, pt seen by EDPA prior to this RN assessment, see PA notes, orders received to medicate and d/c.

## 2015-07-31 NOTE — ED Notes (Signed)
Pt out to nurse's station, threatening to leave, states, "I'll see my GP in the morning, have to leave", family pressuring pt to leave d/t "have to work". Pt reassured, wait and pending tx and d/c explained.

## 2015-08-18 ENCOUNTER — Encounter (HOSPITAL_BASED_OUTPATIENT_CLINIC_OR_DEPARTMENT_OTHER): Payer: Self-pay | Admitting: *Deleted

## 2015-08-18 ENCOUNTER — Emergency Department (HOSPITAL_BASED_OUTPATIENT_CLINIC_OR_DEPARTMENT_OTHER)
Admission: EM | Admit: 2015-08-18 | Discharge: 2015-08-19 | Disposition: A | Discharge status: Home / Self Care | Payer: Medicaid Other | Attending: Emergency Medicine | Admitting: Emergency Medicine

## 2015-08-18 DIAGNOSIS — F329 Major depressive disorder, single episode, unspecified: Secondary | ICD-10-CM | POA: Insufficient documentation

## 2015-08-18 DIAGNOSIS — F17210 Nicotine dependence, cigarettes, uncomplicated: Secondary | ICD-10-CM | POA: Insufficient documentation

## 2015-08-18 DIAGNOSIS — H578 Other specified disorders of eye and adnexa: Secondary | ICD-10-CM | POA: Insufficient documentation

## 2015-08-18 DIAGNOSIS — R102 Pelvic and perineal pain unspecified side: Secondary | ICD-10-CM

## 2015-08-18 DIAGNOSIS — Z202 Contact with and (suspected) exposure to infections with a predominantly sexual mode of transmission: Secondary | ICD-10-CM | POA: Diagnosis not present

## 2015-08-18 DIAGNOSIS — H04123 Dry eye syndrome of bilateral lacrimal glands: Secondary | ICD-10-CM

## 2015-08-18 DIAGNOSIS — R350 Frequency of micturition: Secondary | ICD-10-CM | POA: Diagnosis present

## 2015-08-18 LAB — URINALYSIS, ROUTINE W REFLEX MICROSCOPIC
Bilirubin Urine: NEGATIVE
GLUCOSE, UA: NEGATIVE mg/dL
Hgb urine dipstick: NEGATIVE
KETONES UR: NEGATIVE mg/dL
Leukocytes, UA: NEGATIVE
Nitrite: NEGATIVE
PH: 6 (ref 5.0–8.0)
Protein, ur: NEGATIVE mg/dL
Specific Gravity, Urine: 1.026 (ref 1.005–1.030)

## 2015-08-18 LAB — COMPREHENSIVE METABOLIC PANEL WITH GFR
ALT: 12 U/L — ABNORMAL LOW (ref 14–54)
AST: 15 U/L (ref 15–41)
Albumin: 3.9 g/dL (ref 3.5–5.0)
Alkaline Phosphatase: 76 U/L (ref 38–126)
Anion gap: 9 (ref 5–15)
BUN: 18 mg/dL (ref 6–20)
CO2: 22 mmol/L (ref 22–32)
Calcium: 8.9 mg/dL (ref 8.9–10.3)
Chloride: 105 mmol/L (ref 101–111)
Creatinine, Ser: 0.68 mg/dL (ref 0.44–1.00)
GFR calc Af Amer: >60 mL/min
GFR calc non Af Amer: >60 mL/min
Glucose, Bld: 89 mg/dL (ref 65–99)
Potassium: 3.6 mmol/L (ref 3.5–5.1)
Sodium: 136 mmol/L (ref 135–145)
Total Bilirubin: 0.4 mg/dL (ref 0.3–1.2)
Total Protein: 7.4 g/dL (ref 6.5–8.1)

## 2015-08-18 LAB — WET PREP, GENITAL
Clue Cells Wet Prep HPF POC: NONE SEEN
Sperm: NONE SEEN
Trich, Wet Prep: NONE SEEN
Yeast Wet Prep HPF POC: NONE SEEN

## 2015-08-18 LAB — CBC WITH DIFFERENTIAL/PLATELET
Basophils Absolute: 0 K/uL (ref 0.0–0.1)
Basophils Relative: 0 %
Eosinophils Absolute: 0.2 K/uL (ref 0.0–0.7)
Eosinophils Relative: 2 %
HCT: 32.9 % — ABNORMAL LOW (ref 36.0–46.0)
Hemoglobin: 11.2 g/dL — ABNORMAL LOW (ref 12.0–15.0)
Lymphocytes Relative: 35 %
Lymphs Abs: 3.3 K/uL (ref 0.7–4.0)
MCH: 29.6 pg (ref 26.0–34.0)
MCHC: 34 g/dL (ref 30.0–36.0)
MCV: 86.8 fL (ref 78.0–100.0)
Monocytes Absolute: 0.6 K/uL (ref 0.1–1.0)
Monocytes Relative: 7 %
Neutro Abs: 5.2 K/uL (ref 1.7–7.7)
Neutrophils Relative %: 56 %
Platelets: ADEQUATE K/uL (ref 150–400)
RBC: 3.79 MIL/uL — ABNORMAL LOW (ref 3.87–5.11)
RDW: 14.7 % (ref 11.5–15.5)
WBC: 9.3 K/uL (ref 4.0–10.5)

## 2015-08-18 LAB — PREGNANCY, URINE: Preg Test, Ur: NEGATIVE

## 2015-08-18 LAB — LIPASE, BLOOD: Lipase: 24 U/L (ref 11–51)

## 2015-08-18 MED ORDER — CEFTRIAXONE SODIUM 250 MG IJ SOLR
250.0000 mg | Freq: Once | INTRAMUSCULAR | Status: AC
  Administered 2015-08-18: 250 mg via INTRAMUSCULAR
  Filled 2015-08-18: qty 250

## 2015-08-18 MED ORDER — AZITHROMYCIN 250 MG PO TABS
1000.0000 mg | ORAL_TABLET | Freq: Once | ORAL | Status: AC
  Administered 2015-08-18: 1000 mg via ORAL
  Filled 2015-08-18: qty 4

## 2015-08-18 MED ORDER — KETOTIFEN FUMARATE 0.025 % OP SOLN: 1.0000 [drp] | Freq: Two times a day (BID) | OPHTHALMIC | Status: DC

## 2015-08-18 NOTE — ED Notes (Signed)
Abdominal pain. Urinary frequency. Scanty urination.

## 2015-08-18 NOTE — ED Provider Notes (Signed)
CSN: [id number]     Arrival date & time 08/18/15  4098 History  By signing my name below, I, Rosario Adie, attest that this documentation has been prepared under the direction and in the presence of Samantha Dowless, PA-C.  Electronically Signed: Rosario Adie, ED Scribe. 08/18/2015. 8:51 PM.   Chief Complaint  Patient presents with  . Abdominal Pain   The history is provided by the patient. No language interpreter was used.   HPI Comments: Elizabeth Mendoza is a 35 y.o. female with no pertinent PMHx who presents to the Emergency Department complaining of gradual onset, gradually worsening, intermittent, cramping, lower abdominal pain x 1 week. Pt has had associated urinary frequency, dark urine, and retention x 2 weeks. She also notes that she has had polydipsia and her tongue has felt very dry. No exacerbating or alleviating factors noted. Pt did have a bowel movement today, and was reported as normal. Denies dysuria, hematuria, vaginal bleeding, vaginal discharge, nausea, emesis, or diarrhea. LNMP: 08/01/15.   She is also c/o gradual onset bilateral eye burning x ~3 weeks. Pt was seen in the ED, approximately 19 days ago for similar symptoms. At that time she was recommended to use visine, and return if her symptoms persisted. She also notes that she will wake with crusting around her eyes since the onset of her pain, and has had trouble seeing farther away and photophobia. She has been using medicated eyedrops which have provided minimal relief of her symptoms. She reports that the onset of her pain was when she had a pair of false eyelashes placed, but denies having used any since she was last seen in the ED. Pt states that she did have ear pain 3 days ago, but took unspecified cold medications which relieved her pain and it has not returned since. Denies rhinorrhea, or cough.   Past Medical History  Diagnosis Date  . No pertinent past medical history   . Shortness of breath   .  Ulcer   . Hernia of abdominal wall   . Anxiety   . Depression   . Skin abnormalities     Hiradenitis suppurativa  . Tooth decay   . Anemia   . Gonorrhea    Past Surgical History  Procedure Laterality Date  . Induced abortion    . Dilation and curettage of uterus    . Laparoscopy N/A 12/23/2014    Procedure: LAPAROSCOPY OPERATIVE, Right Salpingectomy with Removal Ectopic Pregnancy, Fulguration Left Fallopian Tube, Cystotomy Right Ovarian Cyst;  Surgeon: Lesly Dukes, MD;  Location: [facility];  Service: Gynecology;  Laterality: N/A;   Family History  Problem Relation Age of Onset  . Anesthesia problems Neg Hx   . Diabetes Father    Social History  Substance Use Topics  . Smoking status: Current Some Day Smoker -- 15 years    Types: Cigarettes  . Smokeless tobacco: Never Used  . Alcohol Use: No     Comment: denies   OB History    Gravida Para Term Preterm AB TAB SAB Ectopic Multiple Living   10 5 5  0 4 3 1  0 0 5     Review of Systems A complete 10 system review of systems was obtained and all systems are negative except as noted in the HPI and PMH.   Allergies  Review of patient's allergies indicates no known allergies.  Home Medications   Prior to Admission medications   Medication Sig Start Date End Date Taking? Authorizing Provider  doxycycline (VIBRA-TABS) 100 MG tablet Take 100 mg by mouth 2 (two) times daily.    Historical Provider, MD  ferrous sulfate 325 (65 FE) MG tablet Take 1 tablet (325 mg total) by mouth 2 (two) times daily with a meal. 12/24/14   [name], MD  metroNIDAZOLE (FLAGYL) 500 MG tablet Take 1 tablet (500 mg total) by mouth 2 (two) times daily. 02/06/15   Duane Lope, NP  ranitidine (ZANTAC) 150 MG tablet Take 1 tablet (150 mg total) by mouth 2 (two) times daily. 02/06/15   [name], NP   BP 124/99 mmHg  Pulse 92  Temp(Src) 98 F (36.7 C) (Oral)  Resp 18  Ht  (1.626 m)  Wt 200 lb (90.719 kg)  BMI 34.31 kg/m2  SpO2  100%  LMP 08/01/2015   Physical Exam  Constitutional: She is oriented to person, place, and time. She appears well-developed and well-nourished. No distress.  HENT:  Head: Normocephalic and atraumatic.  Mouth/Throat: No oropharyngeal exudate.  Eyes: Conjunctivae and EOM are normal. Pupils are equal, round, and reactive to light. Right eye exhibits no discharge. Left eye exhibits no discharge. No scleral icterus.  Cardiovascular: Normal rate, regular rhythm, normal heart sounds and intact distal pulses.  Exam reveals no gallop and no friction rub.   No murmur heard. Pulmonary/Chest: Effort normal and breath sounds normal. No respiratory distress. She has no wheezes. She has no rales. She exhibits no tenderness.  Abdominal: Soft. She exhibits no distension and no mass. There is no tenderness. There is no rebound and no guarding.  Genitourinary: Vaginal discharge found.  No external genital lesions. Copious white vaginal discharge in vaginal vault. No CMT or friability. No adnexal tenderness. No vaginal bleeding.  Musculoskeletal: Normal range of motion. She exhibits no edema.  Neurological: She is alert and oriented to person, place, and time.  Skin: Skin is warm and dry. No rash noted. She is not diaphoretic. No erythema. No pallor.  Psychiatric: She has a normal mood and affect. Her behavior is normal.  Nursing note and vitals reviewed.  ED Course  Procedures   DIAGNOSTIC STUDIES: Oxygen Saturation is 100% on RA, normal by my interpretation.   COORDINATION OF CARE: 8:51 PM-Discussed next steps with pt. Pt verbalized understanding and is agreeable with the plan.   Labs Review Labs Reviewed  WET PREP, GENITAL - Abnormal; Notable for the following:    WBC, Wet Prep HPF POC MANY (*)    All other components within normal limits  URINALYSIS, ROUTINE W REFLEX MICROSCOPIC (NOT AT [facility]) - Abnormal; Notable for the following:    APPearance CLOUDY (*)    All other components within normal  limits  CBC WITH DIFFERENTIAL/PLATELET - Abnormal; Notable for the following:    RBC 3.79 (*)    Hemoglobin 11.2 (*)    HCT 32.9 (*)    All other components within normal limits  COMPREHENSIVE METABOLIC PANEL - Abnormal; Notable for the following:    ALT 12 (*)    All other components within normal limits  PREGNANCY, URINE  LIPASE, BLOOD  RPR  HIV ANTIBODY (ROUTINE TESTING)  GC/CHLAMYDIA PROBE AMP ([place]) NOT AT [facility]    Imaging Review No results found.  I have personally reviewed and evaluated these images and lab results as part of my medical decision-making.  MDM   Final diagnoses:  Pelvic pain in female  Urinary frequency  STD exposure  Dry eye, bilateral   Otherwise healthy 34 y.o  F presents to the ED with multiple complaints. Pt states that she has been experiencing Increased urinary frequency and intermittent suprapubic abdominal pain for 1-2 weeks. Patient has no abdominal tenderness on exam. Abdomen is soft and nonrigid. Urine does not appear to be infected. Blood sugar normal. No lab work evidence of new onset diabetes. No metabolic derangement. No leukocytosis. Patient does not meet the SIRS or Sepsis criteria.  On repeat exam patient does not have a surgical abdomin and there are no peritoneal signs.  No indication of appendicitis, bowel obstruction, bowel perforation, cholecystitis, diverticulitis, PID or ectopic pregnancy. No advanced imaging indicated at this time. Patient initially denied vaginal discharge and did not feel that she was at risk for STDs. However, upon reevaluation patient states that her boyfriend cheated on her several months ago and is concerned that she may have an STD. Pelvic exam performed. There is copious white vaginal discharge in vaginal vault. No adnexal tenderness. There are many WBCs on wet prep. Patient treated in the ED prophylactically with Rocephin and azithromycin. HIV, RPR, gonorrhea and chlamydia cultures were sent.   Patient  also complaining of dry eyes after using false lashes. Conjunctiva appear normal. No purulent drainage from either eye. No visual changes. No evidence of conjunctivitis, keratitis, uveitis. Recommend lubricating eyedrops, prescription for zaditor given today. Also recommend that patient stop using fake lashes. Lastly, patient states that [place] [facility] [place] [place] [facility] [place] and is concerned that she may have lead poisoning. Do not feel that this minimal exposure places patient at risk for lead poisoning. Recommend follow up with OBGYN and PCP for further evaluation.   I personally performed the services described in this documentation, which was scribed in my presence. The recorded information has been reviewed and is accurate.     Lester KinsmanSamantha Tripp [place] PointDow[place], PA-C 08/19/15 0037  Doug SouSam Jacubowitz, MD 08/19/15 1450

## 2015-08-18 NOTE — Discharge Instructions (Signed)
Pelvic Pain, Female Female pelvic pain can be caused by many different things and start from a variety of places. Pelvic pain refers to pain that is located in the lower half of the abdomen and between your hips. The pain may occur over a short period of time (acute) or may be reoccurring (chronic). The cause of pelvic pain may be related to disorders affecting the female reproductive organs (gynecologic), but it may also be related to the bladder, kidney stones, an intestinal complication, or muscle or skeletal problems. Getting help right away for pelvic pain is important, especially if there has been severe, sharp, or a sudden onset of unusual pain. It is also important to get help right away because some types of pelvic pain can be life threatening.  CAUSES  Below are only some of the causes of pelvic pain. The causes of pelvic pain can be in one of several categories.   Gynecologic.  Pelvic inflammatory disease.  Sexually transmitted infection.  Ovarian cyst or a twisted ovarian ligament (ovarian torsion).  Uterine lining that grows outside the uterus (endometriosis).  Fibroids, cysts, or tumors.  Ovulation.  Pregnancy.  Pregnancy that occurs outside the uterus (ectopic pregnancy).  Miscarriage.  Labor.  Abruption of the placenta or ruptured uterus.  Infection.  Uterine infection (endometritis).  Bladder infection.  Diverticulitis.  Miscarriage related to a uterine infection (septic abortion).  Bladder.  Inflammation of the bladder (cystitis).  Kidney stone(s).  Gastrointestinal.  Constipation.  Diverticulitis.  Neurologic.  Trauma.  Feeling pelvic pain because of mental or emotional causes (psychosomatic).  Cancers of the bowel or pelvis. EVALUATION  Your caregiver will want to take a careful history of your concerns. This includes recent changes in your health, a careful gynecologic history of your periods (menses), and a sexual history. Obtaining  your family history and medical history is also important. Your caregiver may suggest a pelvic exam. A pelvic exam will help identify the location and severity of the pain. It also helps in the evaluation of which organ system may be involved. In order to identify the cause of the pelvic pain and be properly treated, your caregiver may order tests. These tests may include:   A pregnancy test.  Pelvic ultrasonography.  An X-ray exam of the abdomen.  A urinalysis or evaluation of vaginal discharge.  Blood tests. HOME CARE INSTRUCTIONS   Only take over-the-counter or prescription medicines for pain, discomfort, or fever as directed by your caregiver.   Rest as directed by your caregiver.   Eat a balanced diet.   Drink enough fluids to make your urine clear or pale yellow, or as directed.   Avoid sexual intercourse if it causes pain.   Apply warm or cold compresses to the lower abdomen depending on which one helps the pain.   Avoid stressful situations.   Keep a journal of your pelvic pain. Write down when it started, where the pain is located, and if there are things that seem to be associated with the pain, such as food or your menstrual cycle.  Follow up with your caregiver as directed.  SEEK MEDICAL CARE IF:  Your medicine does not help your pain.  You have abnormal vaginal discharge. SEEK IMMEDIATE MEDICAL CARE IF:   You have heavy bleeding from the vagina.   Your pelvic pain increases.   You feel light-headed or faint.   You have chills.   You have pain with urination or blood in your urine.   You have uncontrolled  diarrhea or vomiting.   You have a fever or persistent symptoms for more than 3 days.  You have a fever and your symptoms suddenly get worse.   You are being physically or sexually abused.   This information is not intended to replace advice given to you by your health care provider. Make sure you discuss any questions you have with  your health care provider.   Document Released: 12/15/2003 Document Revised: 10/08/2014 Document Reviewed: 05/09/2011 Elsevier Interactive Patient Education 2016 [employer].  Sexually Transmitted Disease A sexually transmitted disease (STD) is a disease or infection that may be passed (transmitted) from person to person, usually during sexual activity. This may happen by way of saliva, semen, blood, vaginal mucus, or urine. Common STDs include:  Gonorrhea.  Chlamydia.  Syphilis.  HIV and AIDS.  Genital herpes.  Hepatitis B and C.  Trichomonas.  Human papillomavirus (HPV).  Pubic lice.  Scabies.  Mites.  Bacterial vaginosis. WHAT ARE CAUSES OF STDs? An STD may be caused by bacteria, a virus, or parasites. STDs are often transmitted during sexual activity if one person is infected. However, they may also be transmitted through nonsexual means. STDs may be transmitted after:   Sexual intercourse with an infected person.  Sharing sex toys with an infected person.  Sharing needles with an infected person or using unclean piercing or tattoo needles.  Having intimate contact with the genitals, mouth, or rectal areas of an infected person.  Exposure to infected fluids during birth. WHAT ARE THE SIGNS AND SYMPTOMS OF STDs? Different STDs have different symptoms. Some people may not have any symptoms. If symptoms are present, they may include:  Painful or bloody urination.  Pain in the pelvis, abdomen, vagina, anus, throat, or eyes.  A skin rash, itching, or irritation.  Growths, ulcerations, blisters, or sores in the genital and anal areas.  Abnormal vaginal discharge with or without bad odor.  Penile discharge in men.  Fever.  Pain or bleeding during sexual intercourse.  Swollen glands in the groin area.  Yellow skin and eyes (jaundice). This is seen with hepatitis.  Swollen testicles.  Infertility.  Sores and blisters in the mouth. HOW ARE STDs  DIAGNOSED? To make a diagnosis, your health care provider may:  Take a medical history.  Perform a physical exam.  Take a sample of any discharge to examine.  Swab the throat, cervix, opening to the penis, rectum, or vagina for testing.  Test a sample of your first morning urine.  Perform blood tests.  Perform a Pap test, if this applies.  Perform a colposcopy.  Perform a laparoscopy. HOW ARE STDs TREATED? Treatment depends on the STD. Some STDs may be treated but not cured.  Chlamydia, gonorrhea, trichomonas, and syphilis can be cured with antibiotic medicine.  Genital herpes, hepatitis, and HIV can be treated, but not cured, with prescribed medicines. The medicines lessen symptoms.  Genital warts from HPV can be treated with medicine or by freezing, burning (electrocautery), or surgery. Warts may come back.  HPV cannot be cured with medicine or surgery. However, abnormal areas may be removed from the cervix, vagina, or vulva.  If your diagnosis is confirmed, your recent sexual partners need treatment. This is true even if they are symptom-free or have a negative culture or evaluation. They should not have sex until their health care providers say it is okay.  Your health care provider may test you for infection again 3 months after treatment. HOW CAN I REDUCE MY  RISK OF GETTING AN STD? Take these steps to reduce your risk of getting an STD:  Use latex condoms, dental dams, and water-soluble lubricants during sexual activity. Do not use petroleum jelly or oils.  Avoid having multiple sex partners.  Do not have sex with someone who has other sex partners  Do not have sex with anyone you do not know or who is at high risk for an STD.  Avoid risky sex practices that can break your skin.  Do not have sex if you have open sores on your mouth or skin.  Avoid drinking too much alcohol or taking illegal drugs. Alcohol and drugs can affect your judgment and put you in a  vulnerable position.  Avoid engaging in oral and anal sex acts.  Get vaccinated for HPV and hepatitis. If you have not received these vaccines in the past, talk to your health care provider about whether one or both might be right for you.  If you are at risk of being infected with HIV, it is recommended that you take a prescription medicine daily to prevent HIV infection. This is called pre-exposure prophylaxis (PrEP). You are considered at risk if:  You are a man who has sex with other men (MSM).  You are a heterosexual man or woman and are sexually active with more than one partner.  You take drugs by injection.  You are sexually active with a partner who has HIV.  Talk with your health care provider about whether you are at high risk of being infected with HIV. If you choose to begin PrEP, you should first be tested for HIV. You should then be tested every 3 months for as long as you are taking PrEP. WHAT SHOULD I DO IF I THINK I HAVE AN STD?  See your health care provider.  Tell your sexual partner(s). They should be tested and treated for any STDs.  Do not have sex until your health care provider says it is okay. WHEN SHOULD I GET IMMEDIATE MEDICAL CARE? Contact your health care provider right away if:   You have severe abdominal pain.  You are a man and notice swelling or pain in your testicles.  You are a woman and notice swelling or pain in your vagina.   This information is not intended to replace advice given to you by your health care provider. Make sure you discuss any questions you have with your health care provider.   Document Released: 04/09/2002 Document Revised: 02/07/2014 Document Reviewed: 08/07/2012 Elsevier Interactive Patient Education 2016 [employer].  Urinary Frequency The number of times a normal person urinates depends upon how much liquid they take in and how much liquid they are losing. If the temperature is hot and there is high humidity, then  the person will sweat more and usually breathe a little more frequently. These factors decrease the amount of frequency of urination that would be considered normal. The amount you drink is easily determined, but the amount of fluid lost is sometimes more difficult to calculate.  Fluid is lost in two ways:  Sensible fluid loss is usually measured by the amount of urine that you get rid of. Losses of fluid can also occur with diarrhea.  Insensible fluid loss is more difficult to measure. It is caused by evaporation. Insensible loss of fluid occurs through breathing and sweating. It usually ranges from a little less than a quart to a little more than a quart of fluid a day. In normal temperatures  and activity levels, the average person may urinate 4 to 7 times in a 24-hour period. Needing to urinate more often than that could indicate a problem. If one urinates 4 to 7 times in 24 hours and has large volumes each time, that could indicate a different problem from one who urinates 4 to 7 times a day and has small volumes. The time of urinating is also important. Most urinating should be done during the waking hours. Getting up at night to urinate frequently can indicate some problems. CAUSES  The bladder is the organ in your lower abdomen that holds urine. Like a balloon, it swells some as it fills up. Your nerves sense this and tell you it is time to head for the bathroom. There are a number of reasons that you might feel the need to urinate more often than usual. They include:  Urinary tract infection. This is usually associated with other signs such as burning when you urinate.  In men, problems with the prostate (a walnut-size gland that is located near the tube that carries urine out of your body). There are two reasons why the prostate can cause an increased frequency of urination:  An enlarged prostate that does not let the bladder empty well. If the bladder only half empties when you urinate, then  it only has half the capacity to fill before you have to urinate again.  The nerves in the bladder become more hypersensitive with an increased size of the prostate even if the bladder empties completely.  Pregnancy.  Obesity. Excess weight is more likely to cause a problem for women than for men.  Bladder stones or other bladder problems.  Caffeine.  Alcohol.  Medications. For example, drugs that help the body get rid of extra fluid (diuretics) increase urine production. Some other medicines must be taken with lots of fluids.  Muscle or nerve weakness. This might be the result of a spinal cord injury, a stroke, multiple sclerosis, or Parkinson disease.  Long-standing diabetes can decrease the sensation of the bladder. This loss of sensation makes it harder to sense the bladder needs to be emptied. Over a period of years, the bladder is stretched out by constant overfilling. This weakens the bladder muscles so that the bladder does not empty well and has less capacity to fill with new urine.  Interstitial cystitis (also called painful bladder syndrome). This condition develops because the tissues that line the inside of the bladder are inflamed (inflammation is the body's way of reacting to injury or infection). It causes pain and frequent urination. It occurs in women more often than in men. DIAGNOSIS   To decide what might be causing your urinary frequency, your health care provider will probably:  Ask about symptoms you have noticed.  Ask about your overall health. This will include questions about any medications you are taking.  Do a physical examination.  Order some tests. These might include:  A blood test to check for diabetes or other health issues that could be contributing to the problem.  Urine testing. This could measure the flow of urine and the pressure on the bladder.  A test of your neurological system (the brain, spinal cord, and nerves). This is the system that  senses the need to urinate.  A bladder test to check whether it is emptying completely when you urinate.  Cystoscopy. This test uses a thin tube with a tiny camera on it. It offers a look inside your urethra and bladder to  see if there are problems.  Imaging tests. You might be given a contrast dye and then asked to urinate. X-rays are taken to see how your bladder is working. TREATMENT  It is important for you to be evaluated to determine if the amount or frequency that you have is unusual or abnormal. If it is found to be abnormal, the cause should be determined and this can usually be found out easily. Depending upon the cause, treatment could include medication, stimulation of the nerves, or surgery. There are not too many things that you can do as an individual to change your urinary frequency. It is important that you balance the amount of fluid intake needed to compensate for your activity and the temperature. Medical problems will be diagnosed and taken care of by your physician. There is no particular bladder training such as Kegel exercises that you can do to help urinary frequency. This is an exercise that is usually recommended for people who have leaking of urine when they laugh, cough, or sneeze. HOME CARE INSTRUCTIONS   Take any medications your health care provider prescribed or suggested. Follow the directions carefully.  Practice any lifestyle changes that are recommended. These might include:  Drinking less fluid or drinking at different times of the day. If you need to urinate often during the night, for example, you may need to stop drinking fluids early in the evening.  Cutting down on caffeine or alcohol. They both can make you need to urinate more often than normal. Caffeine is found in coffee, tea, and sodas.  Losing weight, if that is recommended.  Keep a journal or a log. You might be asked to record how much you drink and when and where you feel the need to urinate.  This will also help evaluate how well the treatment provided by your physician is working. SEEK MEDICAL CARE IF:   Your need to urinate often gets worse.  You feel increased pain or irritation when you urinate.  You notice blood in your urine.  You have questions about any medications that your health care provider recommended.  You notice blood, pus, or swelling at the site of any test or treatment procedure.  You develop a fever of more than 100.51F (38.1C). SEEK IMMEDIATE MEDICAL CARE IF:  You develop a fever of more than 102.7F (38.9C).   This information is not intended to replace advice given to you by your health care provider. Make sure you discuss any questions you have with your health care provider.  Community Resource Guide - Urgent Care Centers    Other Local Resources (Updated 02/2015)  Urgent Care Centers   Hours of Operation and Other Information    Address and Phone Number  [place] [facility]  Hours: Monday - Friday, 7 am - 8 pm, Saturday and Sunday, 8 am - 4 pm  Accepts Medicare, Medicaid, and private insurance  Offers sliding scale for uninsured [phone] [address] [place], [place] [address]   [facility], [facility]    Hours: Monday - Friday, 8 am - 8 pm, Saturday 9 am - 6 pm, and Sunday 11 am - 6 pm  Accepts Medicare, Medicaid, and private insurance  Offers sliding scale for uninsured [phone] [address], [address] [place], [place] [address]  [facility]   Hours: Monday - Sunday, 1 pm - 8 pm  Accepts Medicare, Medicaid, and private insurance  Offers sliding scale for uninsured [phone] [address] [place], [place] [address]   [facility]   Hours: Monday - Thursday, 8 am - 8:30 pm, Friday 8 am - 6 pm, Saturday and Sunday 8 am - 4 pm  Accepts Medicare, Medicaid, and private insurance  Offers sliding scale for uninsured [phone] [address]  [place], [place]       Follow up with your OBGYN  For re-evaluation. 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 These will result in the next 2-3 days. Use eye drops for dry eye and avoid false lashes. Return to the ED if you experience severe worsening of your symptoms, increased abdominal pain, vaginal discharge or bleeding, fevers, blood in your urine, vomiting.

## 2015-08-20 LAB — SYPHILIS: RPR W/REFLEX TO RPR TITER AND TREPONEMAL ANTIBODIES, TRADITIONAL SCREENING AND DIAGNOSIS ALGORITHM: RPR Ser Ql: NONREACTIVE

## 2015-08-20 LAB — GC/CHLAMYDIA PROBE AMP (~~LOC~~) NOT AT ARMC
Chlamydia: NEGATIVE
Neisseria Gonorrhea: NEGATIVE

## 2015-08-20 LAB — HIV ANTIBODY (ROUTINE TESTING W REFLEX): HIV Screen 4th Generation wRfx: NONREACTIVE

## 2015-12-03 ENCOUNTER — Encounter (HOSPITAL_COMMUNITY): Payer: Self-pay | Admitting: *Deleted

## 2015-12-03 ENCOUNTER — Inpatient Hospital Stay (HOSPITAL_COMMUNITY)
Admission: AD | Admit: 2015-12-03 | Discharge: 2015-12-04 | Disposition: A | Discharge status: Home / Self Care | Payer: Medicaid Other | Source: Ambulatory Visit | Attending: Obstetrics & Gynecology | Admitting: Obstetrics & Gynecology

## 2015-12-03 DIAGNOSIS — F17210 Nicotine dependence, cigarettes, uncomplicated: Secondary | ICD-10-CM | POA: Diagnosis not present

## 2015-12-03 DIAGNOSIS — R1030 Lower abdominal pain, unspecified: Secondary | ICD-10-CM | POA: Diagnosis not present

## 2015-12-03 DIAGNOSIS — R109 Unspecified abdominal pain: Secondary | ICD-10-CM | POA: Insufficient documentation

## 2015-12-03 LAB — POCT PREGNANCY, URINE: PREG TEST UR: NEGATIVE

## 2015-12-03 NOTE — MAU Note (Signed)
Pt reports lower abd pain for over a week, was seen and had a C/T and it was normal. States the pain has continued and every am when she gets up she feels really dizzy

## 2015-12-04 DIAGNOSIS — R1030 Lower abdominal pain, unspecified: Secondary | ICD-10-CM | POA: Diagnosis not present

## 2015-12-04 LAB — URINALYSIS, ROUTINE W REFLEX MICROSCOPIC
Bilirubin Urine: NEGATIVE
Glucose, UA: NEGATIVE mg/dL
Hgb urine dipstick: NEGATIVE
KETONES UR: NEGATIVE mg/dL
LEUKOCYTES UA: NEGATIVE
NITRITE: NEGATIVE
PROTEIN: NEGATIVE mg/dL
Specific Gravity, Urine: 1.02 (ref 1.005–1.030)
pH: 6 (ref 5.0–8.0)

## 2015-12-04 LAB — GC/CHLAMYDIA PROBE AMP (~~LOC~~) NOT AT ARMC
Chlamydia: NEGATIVE
Neisseria Gonorrhea: NEGATIVE

## 2015-12-04 LAB — WET PREP, GENITAL
CLUE CELLS WET PREP: NONE SEEN
Sperm: NONE SEEN
TRICH WET PREP: NONE SEEN
YEAST WET PREP: NONE SEEN

## 2015-12-04 NOTE — MAU Note (Signed)
Pt c/o lower abdominal pain that started a little over a week ago. Was seen and had CT done-results were normal. States that the pain has continued-feels like she needs to have a BM/bloated and gassy, but denies constipation. States last BM was today and was normal. Pt is denying n/v/d. Feels pain more after she has had a meal in her lower abdomen. Has some abdominal pain and pressure with urination. Also states that after she had the CT scan, she has felt dizzy and that the room is spinning.

## 2015-12-04 NOTE — MAU Provider Note (Signed)
History     CSN: [id number]  Arrival date and time: 12/03/15 2319   First Provider Initiated Contact with Patient 12/04/15 0011      Chief Complaint  Patient presents with  . Abdominal Pain   Elizabeth Mendoza is a 35 y.o. [id number] who presents today with abdominal pain. She was seen at [facility] on 10/28 and had a CT scan done.    Abdominal Pain  This is a new problem. The current episode started in the past 7 days. The onset quality is gradual. The problem occurs intermittently. The problem has been unchanged. The pain is located in the suprapubic region. The pain is at a severity of 0/10 (no pain right now, but it comes and goes. ). Associated symptoms include dysuria and flatus. Pertinent negatives include no constipation, diarrhea, fever, frequency, nausea or vomiting. The pain is aggravated by eating (noticed the pain has been worse since drinking more sodas and tea. ). The pain is relieved by nothing. She has tried nothing for the symptoms. Prior diagnostic workup includes CT scan.     Past Medical History:  Diagnosis Date  . Anemia   . Anxiety   . Depression   . Gonorrhea   . Hernia of abdominal wall   . No pertinent past medical history   . Shortness of breath   . Skin abnormalities    Hiradenitis suppurativa  . Tooth decay   . Ulcer [facility](H[facility])     Past Surgical History:  Procedure Laterality Date  . DILATION AND CURETTAGE OF UTERUS    . INDUCED ABORTION    . LAPAROSCOPY N/A 12/23/2014   Procedure: LAPAROSCOPY OPERATIVE, Right Salpingectomy with Removal Ectopic Pregnancy, Fulguration Left Fallopian Tube, Cystotomy Right Ovarian Cyst;  Surgeon: Lesly DukesKelly H Leggett, MD;  Location: [facility];  Service: Gynecology;  Laterality: N/A;    Family History  Problem Relation Age of Onset  . Anesthesia problems Neg Hx   . Diabetes Father     Social History  Substance Use Topics  . Smoking status: Current Some Day Smoker    Years: 15.00    Types: Cigarettes  . Smokeless tobacco:  Never Used  . Alcohol use No     Comment: denies    Allergies: No Known Allergies  Prescriptions Prior to Admission  Medication Sig Dispense Refill Last Dose  . sulfamethoxazole-trimethoprim (BACTRIM,SEPTRA) 400-80 MG tablet Take 1 tablet by mouth 2 (two) times daily.     Marland Kitchen. doxycycline (VIBRA-TABS) 100 MG tablet Take 100 mg by mouth 2 (two) times daily.   07/30/2015 at Unknown time  . ferrous sulfate 325 (65 FE) MG tablet Take 1 tablet (325 mg total) by mouth 2 (two) times daily with a meal. 30 tablet 3 02/04/2015 at Unknown time  . ketotifen (ZADITOR) 0.025 % ophthalmic solution Place 1 drop into both eyes 2 (two) times daily. 5 mL 0   . metroNIDAZOLE (FLAGYL) 500 MG tablet Take 1 tablet (500 mg total) by mouth 2 (two) times daily. 14 tablet 0   . ranitidine (ZANTAC) 150 MG tablet Take 1 tablet (150 mg total) by mouth 2 (two) times daily. 60 tablet 1     Review of Systems  Constitutional: Negative for chills and fever.  Gastrointestinal: Positive for abdominal pain and flatus. Negative for constipation, diarrhea, nausea and vomiting.  Genitourinary: Positive for dysuria. Negative for frequency and urgency.  Neurological: Positive for dizziness.   Physical Exam   Blood pressure 153/99, pulse 86, temperature 97.7 F (36.5 C),  temperature source Oral, resp. rate 18, height 5\' 4"  (1.626 m), weight 222 lb (100.7 kg), last menstrual period 11/17/2015, SpO2 100 %, not currently breastfeeding.  Physical Exam  Nursing note and vitals reviewed. Constitutional: She is oriented to person, place, and time. She appears well-developed and well-nourished. No distress.  HENT:  Head: Normocephalic.  Cardiovascular: Normal rate.   Respiratory: Effort normal.  GI: Soft. There is no tenderness. There is no rebound.  Genitourinary:  Genitourinary Comments:  External: no lesion Vagina: small amount of white discharge Cervix: pink, smooth, no CMT Uterus: NSSC Adnexa: NT   Neurological: She is alert  and oriented to person, place, and time.  Skin: Skin is warm and dry.  Psychiatric: She has a normal mood and affect.    Results for orders placed or performed during the hospital encounter of 12/03/15 (from the past 24 hour(s))  Urinalysis, Routine w reflex microscopic (not at [facility])     Status: None   Collection Time: 12/03/15 11:40 PM  Result Value Ref Range   Color, Urine YELLOW YELLOW   APPearance CLEAR CLEAR   Specific Gravity, Urine 1.020 1.005 - 1.030   pH 6.0 5.0 - 8.0   Glucose, UA NEGATIVE NEGATIVE mg/dL   Hgb urine dipstick NEGATIVE NEGATIVE   Bilirubin Urine NEGATIVE NEGATIVE   Ketones, ur NEGATIVE NEGATIVE mg/dL   Protein, ur NEGATIVE NEGATIVE mg/dL   Nitrite NEGATIVE NEGATIVE   Leukocytes, UA NEGATIVE NEGATIVE  Pregnancy, urine POC     Status: None   Collection Time: 12/03/15 11:52 PM  Result Value Ref Range   Preg Test, Ur NEGATIVE NEGATIVE  Wet prep, genital     Status: Abnormal   Collection Time: 12/04/15 12:19 AM  Result Value Ref Range   Yeast Wet Prep HPF POC NONE SEEN NONE SEEN   Trich, Wet Prep NONE SEEN NONE SEEN   Clue Cells Wet Prep HPF POC NONE SEEN NONE SEEN   WBC, Wet Prep HPF POC FEW (A) NONE SEEN   Sperm NONE SEEN     MAU Course  Procedures  MDM  Assessment and Plan   1. Lower abdominal pain    DC home Comfort measures reviewed  Diet changes recommended: decrease caffeine and sodas.  RX: none  Return to MAU as needed FU with OB as planned  Follow-up Information    [facility] .   Contact information: [address] [place] [address] [phone]            Tawnya CrookHogan, Baileigh Modisette Donovan 12/04/2015, 12:13 AM

## 2015-12-04 NOTE — Discharge Instructions (Signed)

## 2016-02-16 ENCOUNTER — Emergency Department (HOSPITAL_BASED_OUTPATIENT_CLINIC_OR_DEPARTMENT_OTHER)
Admission: EM | Admit: 2016-02-16 | Discharge: 2016-02-16 | Disposition: A | Discharge status: Home / Self Care | Payer: Medicaid Other | Attending: Emergency Medicine | Admitting: Emergency Medicine

## 2016-02-16 ENCOUNTER — Encounter (HOSPITAL_BASED_OUTPATIENT_CLINIC_OR_DEPARTMENT_OTHER): Payer: Self-pay | Admitting: *Deleted

## 2016-02-16 DIAGNOSIS — J3489 Other specified disorders of nose and nasal sinuses: Secondary | ICD-10-CM | POA: Insufficient documentation

## 2016-02-16 DIAGNOSIS — Z79899 Other long term (current) drug therapy: Secondary | ICD-10-CM | POA: Diagnosis not present

## 2016-02-16 DIAGNOSIS — R05 Cough: Secondary | ICD-10-CM | POA: Insufficient documentation

## 2016-02-16 DIAGNOSIS — F17210 Nicotine dependence, cigarettes, uncomplicated: Secondary | ICD-10-CM | POA: Diagnosis not present

## 2016-02-16 DIAGNOSIS — R112 Nausea with vomiting, unspecified: Secondary | ICD-10-CM | POA: Diagnosis not present

## 2016-02-16 DIAGNOSIS — J111 Influenza due to unidentified influenza virus with other respiratory manifestations: Secondary | ICD-10-CM

## 2016-02-16 DIAGNOSIS — R509 Fever, unspecified: Secondary | ICD-10-CM | POA: Diagnosis not present

## 2016-02-16 DIAGNOSIS — R000 Tachycardia, unspecified: Secondary | ICD-10-CM | POA: Insufficient documentation

## 2016-02-16 DIAGNOSIS — J029 Acute pharyngitis, unspecified: Secondary | ICD-10-CM | POA: Insufficient documentation

## 2016-02-16 DIAGNOSIS — M791 Myalgia: Secondary | ICD-10-CM | POA: Diagnosis not present

## 2016-02-16 DIAGNOSIS — R0981 Nasal congestion: Secondary | ICD-10-CM | POA: Insufficient documentation

## 2016-02-16 DIAGNOSIS — R69 Illness, unspecified: Secondary | ICD-10-CM

## 2016-02-16 LAB — URINALYSIS, ROUTINE W REFLEX MICROSCOPIC
BILIRUBIN URINE: NEGATIVE
GLUCOSE, UA: NEGATIVE mg/dL
HGB URINE DIPSTICK: NEGATIVE
Ketones, ur: 15 mg/dL — AB
Leukocytes, UA: NEGATIVE
Nitrite: NEGATIVE
Protein, ur: NEGATIVE mg/dL
SPECIFIC GRAVITY, URINE: 1.024 (ref 1.005–1.030)
pH: 6 (ref 5.0–8.0)

## 2016-02-16 LAB — CBC WITH DIFFERENTIAL/PLATELET
Basophils Absolute: 0 10*3/uL (ref 0.0–0.1)
Basophils Relative: 0 %
EOS ABS: 0 10*3/uL (ref 0.0–0.7)
EOS PCT: 0 %
HCT: 33.1 % — ABNORMAL LOW (ref 36.0–46.0)
HEMOGLOBIN: 11.1 g/dL — AB (ref 12.0–15.0)
LYMPHS ABS: 1.6 10*3/uL (ref 0.7–4.0)
LYMPHS PCT: 9 %
MCH: 29.1 pg (ref 26.0–34.0)
MCHC: 33.5 g/dL (ref 30.0–36.0)
MCV: 86.9 fL (ref 78.0–100.0)
MONOS PCT: 4 %
Monocytes Absolute: 0.8 10*3/uL (ref 0.1–1.0)
Neutro Abs: 16.5 10*3/uL — ABNORMAL HIGH (ref 1.7–7.7)
Neutrophils Relative %: 87 %
PLATELETS: 360 10*3/uL (ref 150–400)
RBC: 3.81 MIL/uL — AB (ref 3.87–5.11)
RDW: 14.5 % (ref 11.5–15.5)
WBC: 19 10*3/uL — ABNORMAL HIGH (ref 4.0–10.5)

## 2016-02-16 LAB — BASIC METABOLIC PANEL
Anion gap: 9 (ref 5–15)
BUN: 11 mg/dL (ref 6–20)
CHLORIDE: 100 mmol/L — AB (ref 101–111)
CO2: 25 mmol/L (ref 22–32)
CREATININE: 0.64 mg/dL (ref 0.44–1.00)
Calcium: 9.4 mg/dL (ref 8.9–10.3)
GFR calc Af Amer: >60 mL/min (ref >60)
GFR calc non Af Amer: >60 mL/min (ref >60)
GLUCOSE: 95 mg/dL (ref 65–99)
POTASSIUM: 3.9 mmol/L (ref 3.5–5.1)
Sodium: 134 mmol/L — ABNORMAL LOW (ref 135–145)

## 2016-02-16 MED ORDER — ONDANSETRON 4 MG PO TBDP: 4.0000 mg | ORAL_TABLET | Freq: Three times a day (TID) | ORAL | 0 refills | Status: DC | PRN

## 2016-02-16 MED ORDER — ACETAMINOPHEN 500 MG PO TABS
1000.0000 mg | ORAL_TABLET | Freq: Once | ORAL | Status: AC
  Administered 2016-02-16: 1000 mg via ORAL
  Filled 2016-02-16: qty 2

## 2016-02-16 NOTE — ED Notes (Signed)
Pt verbalizes understanding of d/c instructions and denies any further needs at this time. 

## 2016-02-16 NOTE — ED Provider Notes (Signed)
[facility] Provider Note   CSN: [id number] Arrival date & time: 02/16/16  1642  By signing my name below, I, Elizabeth Mendoza, attest that this documentation has been prepared under the direction and in the presence of [name], MD . Electronically Signed: Nelwyn Mendoza, Scribe. 02/16/2016. 6:16 PM.  History   Chief Complaint Chief Complaint  Patient presents with  . Cough   The history is provided by the patient. No language interpreter was used.   HPI Comments:  Elizabeth Mendoza is a 36 y.o. female who presents to the Emergency Department complaining of 2 days of frequent cough. No modifying factors indicated. Pt reports associated fever, sore throat, rhinorrhea, congestion, chills, vomiting, and diffuse myalgias. She describes her vomit as clear and watery. Pt has tried drinking tea for her symptoms with no relief. She was recently seen at [facility] for her symptoms and was given an X-ray, but left without seeing a doctor.  Pt notes that most of her household has been sick. She denies any dysuria.    Past Medical History:  Diagnosis Date  . Anemia   . Anxiety   . Depression   . Gonorrhea   . Hernia of abdominal wall   . No pertinent past medical history   . Shortness of breath   . Skin abnormalities    Hiradenitis suppurativa  . Tooth decay   . Ulcer [facility])     Patient Active Problem List   Diagnosis Date Noted  . 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 Hidradenitis suppurativa of left axilla 12/24/2014  . Acute blood loss anemia 12/24/2014  . Eczema 12/20/2013    Past Surgical History:  Procedure Laterality Date  . DILATION AND CURETTAGE OF UTERUS    . INDUCED ABORTION    . LAPAROSCOPY N/A 12/23/2014   Procedure: LAPAROSCOPY OPERATIVE, Right Salpingectomy with Removal Ectopic Pregnancy, Fulguration Left Fallopian Tube, Cystotomy Right Ovarian Cyst;  Surgeon: Lesly Dukes, MD;  Location: [facility];  Service:  Gynecology;  Laterality: N/A;    OB History    Gravida Para Term Preterm AB Living   10 5 5  0 4 5   SAB TAB Ectopic Multiple Live Births   1 3 0 0 5       Home Medications    Prior to Admission medications   Medication Sig Start Date End Date Taking? Authorizing Provider  doxycycline (VIBRA-TABS) 100 MG tablet Take 100 mg by mouth 2 (two) times daily.    Historical Provider, MD  ferrous sulfate 325 (65 FE) MG tablet Take 1 tablet (325 mg total) by mouth 2 (two) times daily with a meal. 12/24/14   [name], MD  ketotifen (ZADITOR) 0.025 % ophthalmic solution Place 1 drop into both eyes 2 (two) times daily. 08/18/15   [name], PA-C  metroNIDAZOLE (FLAGYL) 500 MG tablet Take 1 tablet (500 mg total) by mouth 2 (two) times daily. 02/06/15   [name], NP  ondansetron (ZOFRAN ODT) 4 MG disintegrating tablet Take 1 tablet (4 mg total) by mouth every 8 (eight) hours as needed for nausea or vomiting. 02/16/16   [name], MD  ranitidine (ZANTAC) 150 MG tablet Take 1 tablet (150 mg total) by mouth 2 (two) times daily. 02/06/15   Duane Lope, NP    Family History Family History  Problem Relation Age of Onset  . Diabetes Father   . Anesthesia problems Neg Hx  Social History Social History  Substance Use Topics  . Smoking status: Current Some Day Smoker    Years: 15.00    Types: Cigarettes  . Smokeless tobacco: Never Used  . Alcohol use No     Comment: denies     Allergies   Patient has no known allergies.   Review of Systems Review of Systems 10 Systems reviewed and are negative for acute change except as noted in the HPI.  Physical Exam Updated Vital Signs BP 130/88 (BP Location: Left Arm)   Pulse (!) 122   Temp 99.5 F (37.5 C) (Oral)   Resp 22   Ht 5\' 4"  (1.626 m)   Wt 222 lb (100.7 kg)   LMP 01/17/2016   SpO2 99%   BMI 38.11 kg/m   Physical Exam  Constitutional: She is oriented to person, place, and time. She  appears well-developed and well-nourished. No distress.  HENT:  Head: Normocephalic and atraumatic.  Right Ear: Tympanic membrane normal.  Left Ear: Tympanic membrane normal.  Nose: Nose normal.  Mouth/Throat: No trismus in the jaw. No uvula swelling. No oropharyngeal exudate, posterior oropharyngeal edema, posterior oropharyngeal erythema or tonsillar abscesses. No tonsillar exudate.  Eyes: Conjunctivae and EOM are normal. Pupils are equal, round, and reactive to light. Right eye exhibits no discharge. Left eye exhibits no discharge. No scleral icterus.  Neck: Normal range of motion. Neck supple. No neck rigidity. No Brudzinski's sign and no Kernig's sign noted.  Cardiovascular: Regular rhythm.  Tachycardia present.  Exam reveals no gallop and no friction rub.   No murmur heard. Pulmonary/Chest: Effort normal and breath sounds normal. No stridor. No respiratory distress. She has no rales.  Abdominal: Soft. She exhibits no distension. There is no tenderness.  Musculoskeletal: She exhibits no edema or tenderness.  Neurological: She is alert and oriented to person, place, and time.  Skin: Skin is warm and dry. No rash noted. She is not diaphoretic. No erythema.  Psychiatric: She has a normal mood and affect.  Vitals reviewed.    ED Treatments / Results  DIAGNOSTIC STUDIES:  Oxygen Saturation is 99% on RA, normal by my interpretation.    COORDINATION OF CARE:  6:53 PM Discussed treatment plan with pt at bedside which includes Tylenol/Motrin and increasing fluid intake and pt agreed to plan.  Labs (all labs ordered are listed, but only abnormal results are displayed) Labs Reviewed  CBC WITH DIFFERENTIAL/PLATELET - Abnormal; Notable for the following:       Result Value   WBC 19.0 (*)    RBC 3.81 (*)    Hemoglobin 11.1 (*)    HCT 33.1 (*)    Neutro Abs 16.5 (*)    All other components within normal limits  BASIC METABOLIC PANEL - Abnormal; Notable for the following:    Sodium 134  (*)    Chloride 100 (*)    All other components within normal limits  URINALYSIS, ROUTINE W REFLEX MICROSCOPIC - Abnormal; Notable for the following:    Ketones, ur 15 (*)    All other components within normal limits    EKG  EKG Interpretation None       Radiology No results found.  Procedures Procedures (including critical care time)  Medications Ordered in ED Medications  acetaminophen (TYLENOL) tablet 1,000 mg (1,000 mg Oral Given 02/16/16 1706)     Initial Impression / Assessment and Plan / ED Course  I have reviewed the triage vital signs and the nursing notes.  Pertinent labs &  imaging results that were available during my care of the patient were reviewed by me and considered in my medical decision making (see chart for details).  Clinical Course     Consistent with flulike illness. Labs reassuring without evidence of significant electrolyte derangements or renal insufficiency. Leukocytosis could be attributed to presumed influenza. UA without evidence of infection. No other evidence of serious bacterial infection such as acute otitis media, pharyngitis, peritonsillar abscess, meningitis. [facility] noted no evidence of pneumonia. Given his lung sound clear here and did not feel that additional chest x-rays warranted.  Patient able to tolerate by mouth. Significant improvement in symptoms following fever control with Tylenol.  The patient is safe for discharge with strict return precautions.   Final Clinical Impressions(s) / ED Diagnoses   Final diagnoses:  Influenza-like illness  Nausea and vomiting, intractability of vomiting not specified, unspecified vomiting type   Disposition: Discharge  Condition: Good  I have discussed the results, Dx and Tx plan with the patient who expressed understanding and agree(s) with the plan. Discharge instructions discussed at great length. The patient was given strict return precautions who  verbalized understanding of the instructions. No further questions at time of discharge.    New Prescriptions   ONDANSETRON (ZOFRAN ODT) 4 MG DISINTEGRATING TABLET    Take 1 tablet (4 mg total) by mouth every 8 (eight) hours as needed for nausea or vomiting.    Follow Up: Primary care provider  Schedule an appointment as soon as possible for a visit  in 5-7 days, If symptoms do not improve or  worsen   I personally performed the services described in this documentation, which was scribed in my presence. The recorded information has been reviewed and is accurate.        Nira ConnPedro Eduardo Lanice Folden, MD 02/16/16 [phone]

## 2016-02-16 NOTE — ED Triage Notes (Signed)
Cough, fever, chills, body aches since yesterday. [facility] had [facility] but left without being seen by a MD.

## 2016-02-26 ENCOUNTER — Encounter (HOSPITAL_COMMUNITY): Payer: Self-pay | Admitting: Student

## 2016-02-26 ENCOUNTER — Inpatient Hospital Stay (HOSPITAL_COMMUNITY)
Admission: AD | Admit: 2016-02-26 | Discharge: 2016-02-26 | Disposition: A | Discharge status: Home / Self Care | Payer: Medicaid Other | Source: Ambulatory Visit | Attending: Obstetrics & Gynecology | Admitting: Obstetrics & Gynecology

## 2016-02-26 DIAGNOSIS — R1030 Lower abdominal pain, unspecified: Secondary | ICD-10-CM | POA: Diagnosis not present

## 2016-02-26 DIAGNOSIS — N898 Other specified noninflammatory disorders of vagina: Secondary | ICD-10-CM | POA: Diagnosis not present

## 2016-02-26 DIAGNOSIS — F17210 Nicotine dependence, cigarettes, uncomplicated: Secondary | ICD-10-CM | POA: Diagnosis not present

## 2016-02-26 DIAGNOSIS — Z113 Encounter for screening for infections with a predominantly sexual mode of transmission: Secondary | ICD-10-CM | POA: Insufficient documentation

## 2016-02-26 DIAGNOSIS — Z79899 Other long term (current) drug therapy: Secondary | ICD-10-CM | POA: Diagnosis not present

## 2016-02-26 LAB — URINALYSIS, ROUTINE W REFLEX MICROSCOPIC
Bilirubin Urine: NEGATIVE
GLUCOSE, UA: NEGATIVE mg/dL
HGB URINE DIPSTICK: NEGATIVE
Ketones, ur: NEGATIVE mg/dL
LEUKOCYTES UA: NEGATIVE
Nitrite: NEGATIVE
PH: 5 (ref 5.0–8.0)
PROTEIN: NEGATIVE mg/dL
Specific Gravity, Urine: 1.028 (ref 1.005–1.030)

## 2016-02-26 LAB — CBC
HEMATOCRIT: 28.5 % — AB (ref 36.0–46.0)
HEMOGLOBIN: 9.8 g/dL — AB (ref 12.0–15.0)
MCH: 29.4 pg (ref 26.0–34.0)
MCHC: 34.4 g/dL (ref 30.0–36.0)
MCV: 85.6 fL (ref 78.0–100.0)
Platelets: 368 10*3/uL (ref 150–400)
RBC: 3.33 MIL/uL — AB (ref 3.87–5.11)
RDW: 15.1 % (ref 11.5–15.5)
WBC: 10.7 10*3/uL — ABNORMAL HIGH (ref 4.0–10.5)

## 2016-02-26 LAB — WET PREP, GENITAL
Clue Cells Wet Prep HPF POC: NONE SEEN
SPERM: NONE SEEN
Trich, Wet Prep: NONE SEEN
Yeast Wet Prep HPF POC: NONE SEEN

## 2016-02-26 LAB — POCT PREGNANCY, URINE: Preg Test, Ur: NEGATIVE

## 2016-02-26 MED ORDER — KETOROLAC TROMETHAMINE 60 MG/2ML IM SOLN
60.0000 mg | Freq: Once | INTRAMUSCULAR | Status: AC
  Administered 2016-02-26: 60 mg via INTRAMUSCULAR
  Filled 2016-02-26: qty 2

## 2016-02-26 NOTE — MAU Note (Signed)
Patient also is complaining of white vaginal discharge with an odor onset 1 to 2 weeks ago.

## 2016-02-26 NOTE — MAU Note (Signed)
Pt presents to MAU with complaints of lower back and abdominal pain when she tries to have a bowel movement. PT denies any vaginal bleeding or abnormal discharge

## 2016-02-26 NOTE — MAU Provider Note (Signed)
History     CSN: [id number]  Arrival date and time: 02/26/16 1702   First Provider Initiated Contact with Patient 02/26/16 1742      Chief Complaint  Patient presents with  . Abdominal Pain  . Back Pain   HPI  Elizabeth Mendoza is a 36 y.o. [id number] female who presents for abdominal pain & vaginal discharge. Abdominal pain started 2+ months ago & is intermittent. Reports lower abdominal pain that feels sharp at times like "gas pain". Currently rates pain 7/10. Pain is worse with bowel movements & after eating. Has not treated pain. Vaginal discharge has been present for 2 weeks. Can't describe discharge but does note that it is malodorous. Has had 1 sexual partner for the last 4 years; does not use condoms. Hx of STI as a teenager. Denies n/v/d, constipation, dysuria, dyspareunia, postcoital bleeding, or fever/chills.   Past Medical History:  Diagnosis Date  . Anemia   . Anxiety   . Depression   . Gonorrhea   . Hernia of abdominal wall   . Shortness of breath   . Skin abnormalities    Hiradenitis suppurativa  . Tooth decay   . Ulcer [facility])     Past Surgical History:  Procedure Laterality Date  . DILATION AND CURETTAGE OF UTERUS    . INDUCED ABORTION    . LAPAROSCOPY N/A 12/23/2014   Procedure: LAPAROSCOPY OPERATIVE, Right Salpingectomy with Removal Ectopic Pregnancy, Fulguration Left Fallopian Tube, Cystotomy Right Ovarian Cyst;  Surgeon: Lesly Dukes, MD;  Location: [facility];  Service: Gynecology;  Laterality: N/A;  . TUBAL LIGATION      Family History  Problem Relation Age of Onset  . Diabetes Father   . Anesthesia problems Neg Hx     Social History  Substance Use Topics  . Smoking status: Current Some Day Smoker    Years: 15.00    Types: Cigarettes  . Smokeless tobacco: Never Used  . Alcohol use No     Comment: denies    Allergies:  Allergies  Allergen Reactions  . Aspirin Nausea And Vomiting    Prescriptions Prior to Admission  Medication Sig Dispense  Refill Last Dose  . ferrous sulfate 325 (65 FE) MG tablet Take 1 tablet (325 mg total) by mouth 2 (two) times daily with a meal. (Patient not taking: Reported on 02/26/2016) 30 tablet 3 Not Taking at Unknown time  . ketotifen (ZADITOR) 0.025 % ophthalmic solution Place 1 drop into both eyes 2 (two) times daily. (Patient not taking: Reported on 02/26/2016) 5 mL 0 Completed Course at Unknown time  . metroNIDAZOLE (FLAGYL) 500 MG tablet Take 1 tablet (500 mg total) by mouth 2 (two) times daily. (Patient not taking: Reported on 02/26/2016) 14 tablet 0 Completed Course at Unknown time  . ondansetron (ZOFRAN ODT) 4 MG disintegrating tablet Take 1 tablet (4 mg total) by mouth every 8 (eight) hours as needed for nausea or vomiting. (Patient not taking: Reported on 02/26/2016) 20 tablet 0 Not Taking at Unknown time  . ranitidine (ZANTAC) 150 MG tablet Take 1 tablet (150 mg total) by mouth 2 (two) times daily. (Patient not taking: Reported on 02/26/2016) 60 tablet 1 Not Taking at Unknown time    Review of Systems  Constitutional: Negative.   Gastrointestinal: Positive for abdominal pain. Negative for abdominal distention, blood in stool, constipation, diarrhea, nausea and vomiting.  Genitourinary: Positive for vaginal discharge. Negative for dyspareunia, dysuria, genital sores, vaginal bleeding and vaginal pain.  Musculoskeletal: Positive for back pain.  Physical Exam   Blood pressure 134/84, pulse 85, temperature 97.5 F (36.4 C), temperature source Oral, resp. rate 16, last menstrual period 01/17/2016.  Physical Exam  Nursing note and vitals reviewed. Constitutional: She is oriented to person, place, and time. She appears well-developed and well-nourished. No distress.  HENT:  Head: Normocephalic and atraumatic.  Eyes: Conjunctivae are normal. Right eye exhibits no discharge. Left eye exhibits no discharge. No scleral icterus.  Neck: Normal range of motion.  Cardiovascular: Normal rate, regular rhythm  and normal heart sounds.   No murmur heard. Respiratory: Effort normal and breath sounds normal. No respiratory distress. She has no wheezes.  GI: Soft. Bowel sounds are normal. She exhibits no distension. There is tenderness in the suprapubic area. There is no rigidity and no guarding. No hernia.  Genitourinary: Uterus normal. Cervix exhibits no motion tenderness and no friability. No bleeding in the vagina. Vaginal discharge (small amount of thin white discharge) found.  Neurological: She is alert and oriented to person, place, and time.  Skin: Skin is warm and dry. She is not diaphoretic.  Psychiatric: She has a normal mood and affect. Her behavior is normal. Judgment and thought content normal.    MAU Course  Procedures Results for orders placed or performed during the hospital encounter of 02/26/16 (from the past 48 hour(s))  Urinalysis, Routine w reflex microscopic     Status: Abnormal   Collection Time: 02/26/16  5:16 PM  Result Value Ref Range   Color, Urine YELLOW YELLOW   APPearance HAZY (A) CLEAR   Specific Gravity, Urine 1.028 1.005 - 1.030   pH 5.0 5.0 - 8.0   Glucose, UA NEGATIVE NEGATIVE mg/dL   Hgb urine dipstick NEGATIVE NEGATIVE   Bilirubin Urine NEGATIVE NEGATIVE   Ketones, ur NEGATIVE NEGATIVE mg/dL   Protein, ur NEGATIVE NEGATIVE mg/dL   Nitrite NEGATIVE NEGATIVE   Leukocytes, UA NEGATIVE NEGATIVE  Pregnancy, urine POC     Status: None   Collection Time: 02/26/16  5:26 PM  Result Value Ref Range   Preg Test, Ur NEGATIVE NEGATIVE    Comment:        THE SENSITIVITY OF THIS METHODOLOGY IS >24 mIU/mL   Wet prep, genital     Status: Abnormal   Collection Time: 02/26/16  6:50 PM  Result Value Ref Range   Yeast Wet Prep HPF POC NONE SEEN NONE SEEN   Trich, Wet Prep NONE SEEN NONE SEEN   Clue Cells Wet Prep HPF POC NONE SEEN NONE SEEN   WBC, Wet Prep HPF POC FEW (A) NONE SEEN    Comment: FEW BACTERIA SEEN   Sperm NONE SEEN   CBC     Status: Abnormal    Collection Time: 02/26/16  7:33 PM  Result Value Ref Range   WBC 10.7 (H) 4.0 - 10.5 K/uL   RBC 3.33 (L) 3.87 - 5.11 MIL/uL   Hemoglobin 9.8 (L) 12.0 - 15.0 g/dL   HCT 16.1 (L) 09.6 - 04.5 %   MCV 85.6 78.0 - 100.0 fL   MCH 29.4 26.0 - 34.0 pg   MCHC 34.4 30.0 - 36.0 g/dL   RDW 40.9 81.1 - 91.4 %   Platelets 368 150 - 400 K/uL  RPR     Status: None   Collection Time: 02/26/16  7:33 PM  Result Value Ref Range   RPR Ser Ql Non Reactive Non Reactive    Comment: (NOTE) Performed At: [facility] [address] [place], [place] [id number] [place] Mulligan  Titus DubinWilliam F MD WU:[id number]:[phone]   HIV antibody     Status: None   Collection Time: 02/26/16  7:33 PM  Result Value Ref Range   HIV Screen 4th Generation wRfx Non Reactive Non Reactive    Comment: (NOTE) Performed At: [facility] [address] [place], [place] [id number] Mila HomerHancock William F MD VH:[id number]:[phone]      MDM UPT negative CBC, HIV, RPR, GC/CT, wet prep Toradol 60 mg IM --- pt reports resolution of symptoms VSS, NAD  Assessment and Plan  A: 1. Lower abdominal pain   2. Leukorrhea, vaginal, noninfectious   3. Screen for STD (sexually transmitted disease)    P: Discharge home Discussed use of OTC meds for symptoms F/u with PCP GC/CT pending F/u with ED if symptoms worsen or fever develops  Judeth Hornrin Meleane Selinger 02/26/2016, 6:21 PM

## 2016-02-26 NOTE — MAU Provider Note (Signed)
Obstetric Resident MAU Note  Chief Complaint:  Abdominal Pain and Back Pain   First Provider Initiated Contact with Patient 02/26/16 1742     HPI: Elizabeth Mendoza is a 36 y.o. [id number] who presents to maternity admissions reporting lower abdominal pain and lower back pain.  She states her abdominal pain started 2 months ago and describes it as an aching pain in the central intertriginous region between her abdomen and pelvis.  She also reports lower back pain that began 1-2 weeks ago described as aching pain that does not radiate.  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  Both her abdominal pain and back pain are worsened with BMs but not by changes in position or physical activity.  Pt also endorses intermittent abdominal bloating, recent increase in white malodorous vaginal discharge that is neither thin or thick in consistency for the past 2 weeks and 4 episodes of loose stool yesterday.  She denies vaginal bleeding, vaginal itching or irritation, fever, chills, constipation, n/v, dysuria, hematuria or blood in stool.  She cannot recall the date of her LMP, but believes she did have a period earlier this month.  She notes that her periods have been heavier than normal recently.  She usually has regular monthly periods that last 4-5 days with moderate to heavy bleeding.  Pt is currently sexually active in a monogamous relationship, uses no protection or contraception and is s/p bilateral tubal ligation and right salpingectomy.   Patient Active Problem List   Diagnosis Date Noted  . 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 Hidradenitis suppurativa of left axilla 12/24/2014  . Acute blood loss anemia 12/24/2014  . Eczema 12/20/2013    Past Medical History:  Diagnosis Date  . Anemia   . Anxiety   . Depression   . Gonorrhea   . Hernia of abdominal wall   . Shortness of breath   . Skin abnormalities    Hiradenitis suppurativa  . Tooth decay   . Ulcer (HCC)     OB History  Gravida Para Term Preterm AB Living  10 5 5  0 5 5  SAB TAB Ectopic Multiple Live Births  1 3 1  0 5    # Outcome Date GA Lbr Len/2nd Weight Sex Delivery Anes PTL Lv  10 Ectopic 12/23/14 [redacted]w[redacted]d         9 Term 02/09/14 [redacted]w[redacted]d 03:06 / 00:05 3.675 kg (8 lb 1.6 oz) F Vag-Spont EPI  LIV  8 Term 11/09/11 [redacted]w[redacted]d 31:30 / 00:40 3.825 kg (8 lb 6.9 oz) M Vag-Spont EPI  LIV  7 Term 07/16/08 [redacted]w[redacted]d  3.232 kg (7 lb 2 oz) F Vag-Spont   LIV     Birth Comments: No complications  6 Term 10/27/06 [redacted]w[redacted]d  3.232 kg (7 lb 2 oz) M Vag-Spont   LIV     Birth Comments: No complications  5 Term 01/04/04 [redacted]w[redacted]d  3.827 kg (8 lb 7 oz) F Vag-Spont   LIV     Birth Comments: No complications  4 SAB           3 TAB           2 TAB           1 TAB               Past Surgical History:  Procedure Laterality Date  . DILATION AND CURETTAGE OF UTERUS    . INDUCED ABORTION    .  LAPAROSCOPY N/A 12/23/2014   Procedure: LAPAROSCOPY OPERATIVE, Right Salpingectomy with Removal Ectopic Pregnancy, Fulguration Left Fallopian Tube, Cystotomy Right Ovarian Cyst;  Surgeon: Lesly DukesKelly H Leggett, MD;  Location: [facility];  Service: Gynecology;  Laterality: N/A;  . TUBAL LIGATION      Family History: Family History  Problem Relation Age of Onset  . Diabetes Father   . Anesthesia problems Neg Hx     Social History: Social History  Substance Use Topics  . Smoking status: Current Some Day Smoker    Years: 15.00    Types: Cigarettes  . Smokeless tobacco: Never Used  . Alcohol use No     Comment: denies    Allergies:  Allergies  Allergen Reactions  . Aspirin Nausea And Vomiting    Prescriptions Prior to Admission  Medication Sig Dispense Refill Last Dose  . ferrous sulfate 325 (65 FE) MG tablet Take 1 tablet (325 mg total) by mouth 2 (two) times daily with a meal. (Patient not taking: Reported on 02/26/2016) 30 tablet 3 Not Taking at Unknown time  . ketotifen  (ZADITOR) 0.025 % ophthalmic solution Place 1 drop into both eyes 2 (two) times daily. (Patient not taking: Reported on 02/26/2016) 5 mL 0 Completed Course at Unknown time  . metroNIDAZOLE (FLAGYL) 500 MG tablet Take 1 tablet (500 mg total) by mouth 2 (two) times daily. (Patient not taking: Reported on 02/26/2016) 14 tablet 0 Completed Course at Unknown time  . ondansetron (ZOFRAN ODT) 4 MG disintegrating tablet Take 1 tablet (4 mg total) by mouth every 8 (eight) hours as needed for nausea or vomiting. (Patient not taking: Reported on 02/26/2016) 20 tablet 0 Not Taking at Unknown time  . ranitidine (ZANTAC) 150 MG tablet Take 1 tablet (150 mg total) by mouth 2 (two) times daily. (Patient not taking: Reported on 02/26/2016) 60 tablet 1 Not Taking at Unknown time    ROS: Pertinent findings in history of present illness.  Physical Exam  Blood pressure 145/92, pulse 87, temperature 97.9 F (36.6 C), resp. rate 20, last menstrual period 01/17/2016. CONSTITUTIONAL: Well-developed, well-nourished female in no acute distress.  HENT:  Normocephalic, atraumatic, External right and left ear normal.  EYES: Conjunctivae and EOM are normal.  No scleral icterus.  NECK: Normal range of motion, CARDIOVASCULAR: Normal heart rate noted, regular rhythm RESPIRATORY: Effort and breath sounds normal, no problems with respiration noted ABDOMEN: Soft, tender to deep palpation in central intertriginous region between lower abdomen and pelvis, nondistended MUSCULOSKELETAL: Normal range of motion. No edema and no tenderness palpation over spine or paraspinal muscles, negative straight leg raise, no CVA tenderness SKIN: Skin is warm and dry. No rash noted. Not diaphoretic. No erythema. No pallor. NEUROLGIC: Alert and oriented to person, place, and time. Normal reflexes, muscle tone coordination. No cranial nerve deficit noted. PSYCHIATRIC: Normal mood and affect. Normal behavior. Normal judgment and thought content.  SPECULUM  EXAM: NEFG, excess malodorous white discharge in vaginal vault, no blood, cervix clean     Labs: Results for orders placed or performed during the hospital encounter of 02/26/16 (from the past 24 hour(s))  Urinalysis, Routine w reflex microscopic     Status: Abnormal   Collection Time: 02/26/16  5:16 PM  Result Value Ref Range   Color, Urine YELLOW YELLOW   APPearance HAZY (A) CLEAR   Specific Gravity, Urine 1.028 1.005 - 1.030   pH 5.0 5.0 - 8.0   Glucose, UA NEGATIVE NEGATIVE mg/dL   Hgb urine dipstick NEGATIVE NEGATIVE  Bilirubin Urine NEGATIVE NEGATIVE   Ketones, ur NEGATIVE NEGATIVE mg/dL   Protein, ur NEGATIVE NEGATIVE mg/dL   Nitrite NEGATIVE NEGATIVE   Leukocytes, UA NEGATIVE NEGATIVE  Pregnancy, urine POC     Status: None   Collection Time: 02/26/16  5:26 PM  Result Value Ref Range   Preg Test, Ur NEGATIVE NEGATIVE    Imaging:  No results found.  MAU Course: Urine pregnancy test negative UA negative for blood, leuk esterase or nitrites CBC wnl Wet prep  GC/Chlamydia pending HIV and RPR pending  Assessment and Plan: Nikeya Corbridge is a 36 yo [id number] that presents with lower abdominal pain in context of new white, malodorous vaginal discharge and lower back pain requiring further evaluation and treatment for pain control.  Lower abdominal pain: Pt's pain consistent with vaginitis vs gas pain.  Intermittent bloating and increase pain with BM support gas pain.  New malodorous vaginal discharge and sexual without protection support vaginitis.  STI/PID is also considered given the same signs/symptoms.  UA negative for blood, leuk esterase or nitrites suggest against UTI.  Pregnancy unlikely given negative pregnancy test today. -Toradol for pain  Low back pain: Pt's back pain most consistent with muscle strain given pt's job at [employer] that requires a lot of bending over and lifting.  Pt's obesity likely also contributing to extra strain on her lower back.   Negative straight leg raise and no tenderness over spinal column or paraspinal muscles on exam are reassuring and suggest against disc herniation. -Toradol for pain -Educated pt on importance of stretching and safe lifting technique at work.  Dispo: Discharge home  Satira Anis, Medical Student PGY-2 02/26/2016 6:24 PM

## 2016-02-26 NOTE — Discharge Instructions (Signed)

## 2016-02-27 LAB — RPR: RPR Ser Ql: NONREACTIVE

## 2016-02-27 LAB — HIV ANTIBODY (ROUTINE TESTING W REFLEX): HIV Screen 4th Generation wRfx: NONREACTIVE

## 2016-03-01 LAB — GC/CHLAMYDIA PROBE AMP (~~LOC~~) NOT AT ARMC
Chlamydia: NEGATIVE
NEISSERIA GONORRHEA: NEGATIVE

## 2016-03-17 IMAGING — CR DG CHEST 2V
2 series · 2 of 2 positions shown · non-contrast
Comparison: 04/27/2012

CLINICAL DATA: Sternal chest pain for 3 days.

EXAM:
CHEST  2 VIEW

[w chest pa]
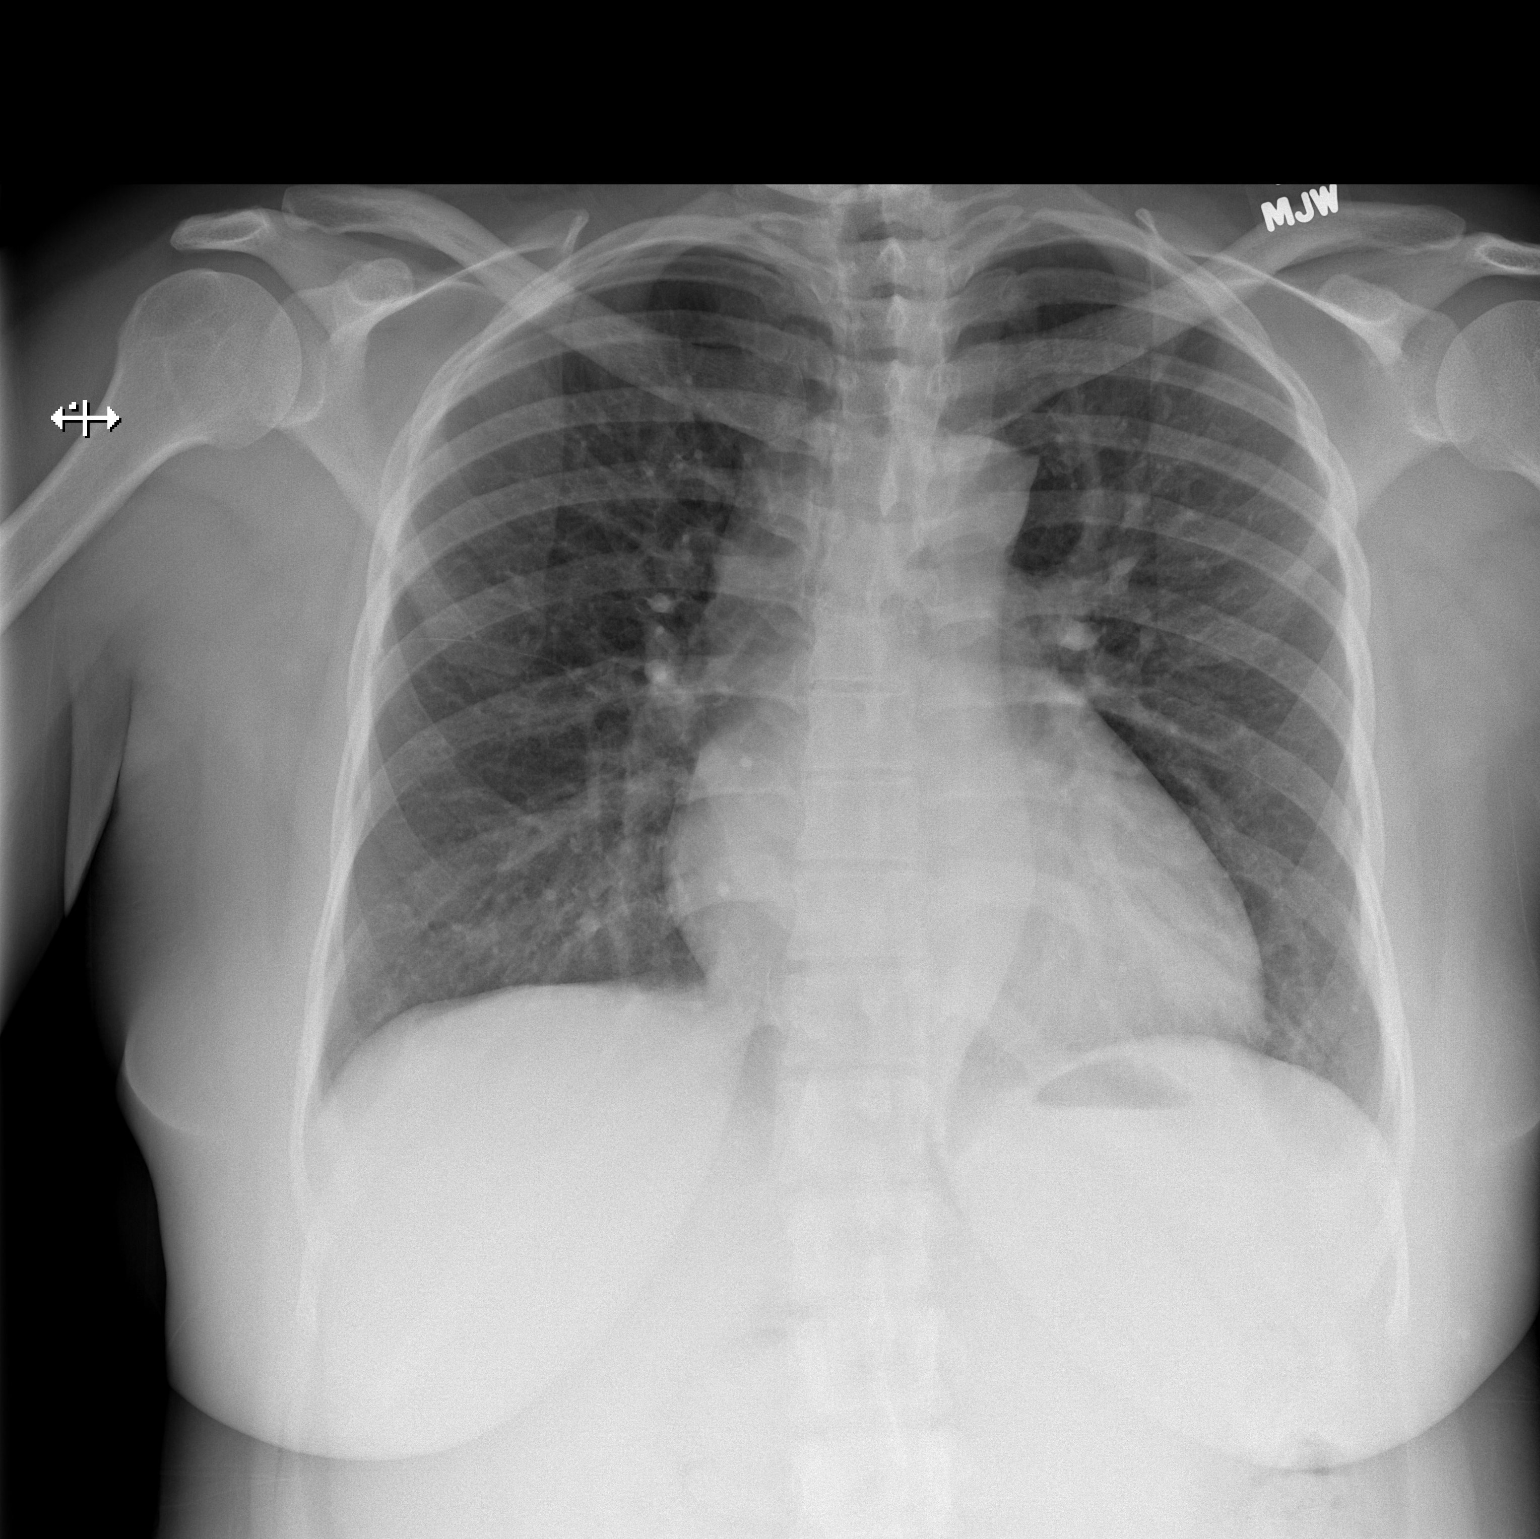

[w chest lat]
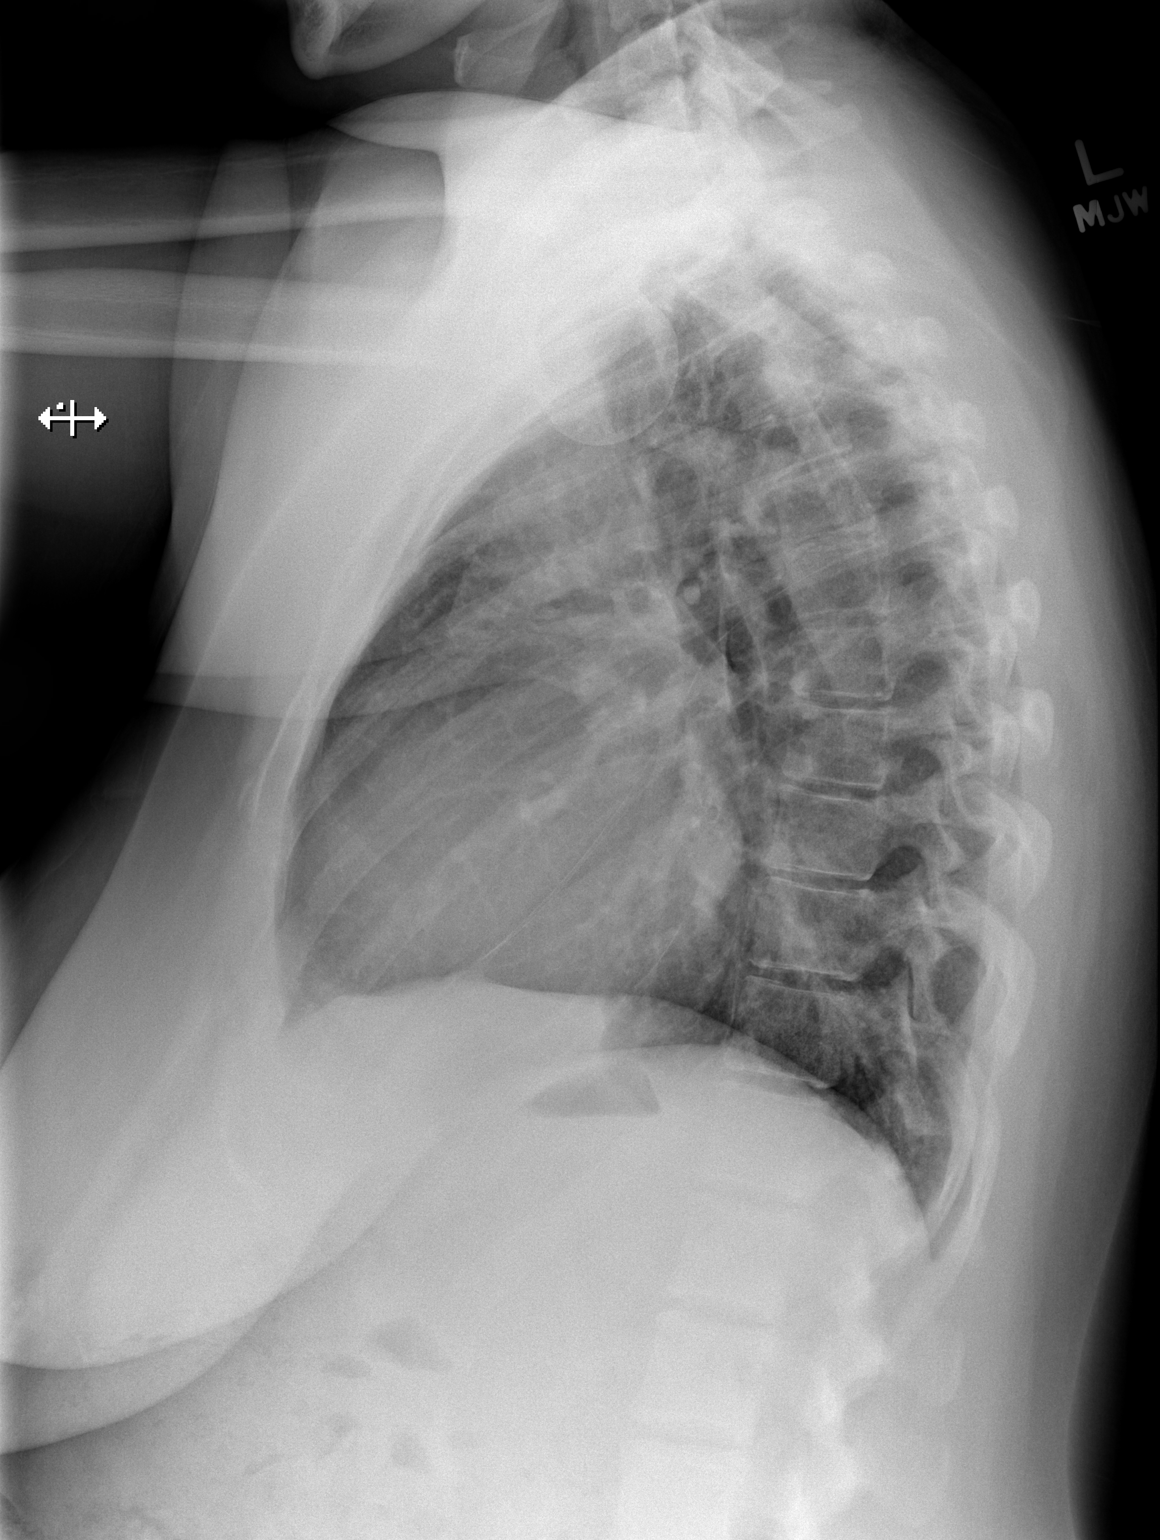

[2 of 2 positions shown; findings below may reference images not displayed]

FINDINGS: Heart size is upper limits normal. Lungs are clear. No pulmonary
edema. Visualized osseous structures have a normal appearance.
IMPRESSION: No active cardiopulmonary disease.

## 2016-06-24 ENCOUNTER — Emergency Department (HOSPITAL_BASED_OUTPATIENT_CLINIC_OR_DEPARTMENT_OTHER): Payer: Medicaid Other

## 2016-06-24 ENCOUNTER — Encounter (HOSPITAL_BASED_OUTPATIENT_CLINIC_OR_DEPARTMENT_OTHER): Payer: Self-pay

## 2016-06-24 ENCOUNTER — Emergency Department (HOSPITAL_BASED_OUTPATIENT_CLINIC_OR_DEPARTMENT_OTHER)
Admission: EM | Admit: 2016-06-24 | Discharge: 2016-06-24 | Disposition: A | Discharge status: Home / Self Care | Payer: Medicaid Other | Attending: Emergency Medicine | Admitting: Emergency Medicine

## 2016-06-24 DIAGNOSIS — F17210 Nicotine dependence, cigarettes, uncomplicated: Secondary | ICD-10-CM | POA: Insufficient documentation

## 2016-06-24 DIAGNOSIS — R05 Cough: Secondary | ICD-10-CM | POA: Diagnosis present

## 2016-06-24 DIAGNOSIS — J020 Streptococcal pharyngitis: Secondary | ICD-10-CM | POA: Diagnosis not present

## 2016-06-24 MED ORDER — PENICILLIN V POTASSIUM 500 MG PO TABS: 500.0000 mg | ORAL_TABLET | Freq: Two times a day (BID) | ORAL | 0 refills | Status: AC

## 2016-06-24 MED ORDER — GUAIFENESIN 100 MG/5ML PO LIQD: 100.0000 mg | ORAL | 0 refills | Status: DC | PRN

## 2016-06-24 NOTE — ED Provider Notes (Signed)
[facility] Provider Note   CSN: [id number] Arrival date & time: 06/24/16  1652  By signing my name below, I, Cynda Acres, attest that this documentation has been prepared under the direction and in the presence of Audry Pili, PA-C Electronically Signed: Cynda Acres, Scribe. 06/24/16. 6:48 PM.  History   Chief Complaint Chief Complaint  Patient presents with  . Cough   HPI Comments: Elizabeth Mendoza is a 36 y.o. female with no pertinent medical history, who presents to the Emergency Department complaining of a sudden-onset, persistent productive cough that began one week ago. Patient states her symptoms have progressively worsened since she went out into the rain 6 days ago. Patient states she went to urgent care last week and was diagnosed with the flu, she was prescribed tamiflu. Patient denies any sick contacts.  Patient reports associated sore throat, rhinorrhea, and posttussive emesis. Patient reports tylenol and multiple other over the counter medications with no relief. Patient denies any fever, chills, nausea, diarrhea, or any additional symptoms.   The history is provided by the patient. No language interpreter was used.    Past Medical History:  Diagnosis Date  . Anemia   . Anxiety   . Depression   . Gonorrhea   . Hernia of abdominal wall   . Shortness of breath   . Skin abnormalities    Hiradenitis suppurativa  . Tooth decay   . Ulcer     Patient Active Problem List   Diagnosis Date Noted  . 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 Hidradenitis suppurativa of left axilla 12/24/2014  . Acute blood loss anemia 12/24/2014  . Eczema 12/20/2013    Past Surgical History:  Procedure Laterality Date  . DILATION AND CURETTAGE OF UTERUS    . INDUCED ABORTION    . LAPAROSCOPY N/A 12/23/2014   Procedure: LAPAROSCOPY OPERATIVE, Right Salpingectomy with Removal Ectopic Pregnancy, Fulguration Left Fallopian Tube, Cystotomy  Right Ovarian Cyst;  Surgeon: Lesly Dukes, MD;  Location: [facility];  Service: Gynecology;  Laterality: N/A;  . TUBAL LIGATION      OB History    Gravida Para Term Preterm AB Living   10 5 5  0 5 5   SAB TAB Ectopic Multiple Live Births   1 3 1  0 5       Home Medications    Prior to Admission medications   Not on File    Family History Family History  Problem Relation Age of Onset  . Diabetes Father   . Anesthesia problems Neg Hx     Social History Social History  Substance Use Topics  . Smoking status: Current Some Day Smoker    Years: 15.00    Types: Cigarettes  . Smokeless tobacco: Never Used  . Alcohol use No     Allergies   Aspirin   Review of Systems Review of Systems  Constitutional: Negative for chills and fever.  HENT: Positive for congestion, rhinorrhea and sore throat. Negative for ear pain.   Respiratory: Positive for cough.   Gastrointestinal: Positive for vomiting (posttusive). Negative for diarrhea and nausea.     Physical Exam Updated Vital Signs BP 106/73 (BP Location: Left Arm)   Pulse (!) 103   Temp 99.2 F (37.3 C) (Oral)   Resp 18   Ht 5\' 4"  (1.626 m)   Wt 223 lb (101.2 kg)   LMP 06/23/2016   SpO2 98%   BMI 38.28 kg/m   Physical Exam  Constitutional: She is oriented to person, place, and time. She appears well-developed and well-nourished. No distress.  HENT:  Head: Normocephalic and atraumatic.  Right Ear: Tympanic membrane, external ear and ear canal normal.  Left Ear: Tympanic membrane, external ear and ear canal normal.  Nose: Nose normal.  Mouth/Throat: Uvula is midline and mucous membranes are normal. No trismus in the jaw. Oropharyngeal exudate present. No posterior oropharyngeal erythema or tonsillar abscesses.  Oropharynx with exudate and erythema.   Eyes: Conjunctivae and EOM are normal. Pupils are equal, round, and reactive to light.  Neck: Normal range of motion. Neck supple. No tracheal deviation present.    Cardiovascular: Normal rate, regular rhythm, S1 normal, S2 normal, normal heart sounds, intact distal pulses and normal pulses.   Pulmonary/Chest: Effort normal and breath sounds normal. No respiratory distress. She has no decreased breath sounds. She has no wheezes. She has no rhonchi. She has no rales.  Abdominal: Soft. Normal appearance and bowel sounds are normal. There is no tenderness.  Musculoskeletal: Normal range of motion.  Neurological: She is alert and oriented to person, place, and time.  Skin: Skin is warm and dry.  Psychiatric: She has a normal mood and affect. Her speech is normal and behavior is normal. Thought content normal.  Nursing note and vitals reviewed.    ED Treatments / Results  DIAGNOSTIC STUDIES: Oxygen Saturation is 98% on RA, normal by my interpretation.    COORDINATION OF CARE: 6:46 PM Discussed treatment plan with pt at bedside and pt agreed to plan, which includes antibiotics.   Labs (all labs ordered are listed, but only abnormal results are displayed) Labs Reviewed - No data to display  EKG  EKG Interpretation None       Radiology No results found.  Procedures Procedures (including critical care time)  Medications Ordered in ED Medications - No data to display   Initial Impression / Assessment and Plan / ED Course  I have reviewed the triage vital signs and the nursing notes.  Pertinent labs & imaging results that were available during my care of the patient were reviewed by me and considered in my medical decision making (see chart for details).  Final Clinical Impressions(s) / ED Diagnoses    {I have reviewed and evaluated the relevant imaging studies.  {I have reviewed the relevant previous healthcare records.  {I obtained HPI from historian.   ED Course:  Assessment: Pt is a 36 y.o. female presents with URI symptoms . On exam, pt in NAD. VSS. Afebrile. Lungs CTA, Heart RRR. Abdomen nontender/soft. Posterior oropharynx with  erythema and exudate. Pt CXR negative for acute infiltrate. Patients symptoms are consistent with URI, likely strep etiology. Given Rx ABX. Pt will be discharged with symptomatic treatment.  Verbalizes understanding and is agreeable with plan. Pt is hemodynamically stable & in NAD prior to dc.   Disposition/Plan:  DC Home Additional Verbal discharge instructions given and discussed with patient.  Pt Instructed to f/u with PCP in the next week for evaluation and treatment of symptoms. Return precautions given Pt acknowledges and agrees with plan  Supervising Physician Loren RacerYelverton, David, MD  Final diagnoses:  Strep pharyngitis    New Prescriptions New Prescriptions   No medications on file   I personally performed the services described in this documentation, which was scribed in my presence. The recorded information has been reviewed and is accurate.    Audry PiliMohr, Murry Diaz, PA-C 06/24/16 1849    Loren RacerYelverton, David, MD 07/01/16 1344

## 2016-06-24 NOTE — ED Notes (Signed)
Patient transported to X-ray 

## 2016-06-24 NOTE — Discharge Instructions (Signed)
Please read and follow all provided instructions.  Your diagnoses today include:  1. Strep pharyngitis    Tests performed today include: Vital signs. See below for your results today.   Medications prescribed:  Take as prescribed   Home care instructions:  Follow any educational materials contained in this packet.  Follow-up instructions: Please follow-up with your primary care provider for further evaluation of symptoms and treatment   Return instructions:  Please return to the Emergency Department if you do not get better, if you get worse, or new symptoms OR  - Fever (temperature greater than 101.37F)  - Bleeding that does not stop with holding pressure to the area    -Severe pain (please note that you may be more sore the day after your accident)  - Chest Pain  - Difficulty breathing  - Severe nausea or vomiting  - Inability to tolerate food and liquids  - Passing out  - Skin becoming red around your wounds  - Change in mental status (confusion or lethargy)  - New numbness or weakness    Please return if you have any other emergent concerns.  Additional Information:  Your vital signs today were: BP 106/73 (BP Location: Left Arm)    Pulse (!) 103    Temp 98.2 F (36.8 C) (Oral)    Resp 18    Ht 5\' 4"  (1.626 m)    Wt 101.2 kg (223 lb)    LMP 06/23/2016    SpO2 98%    BMI 38.28 kg/m  If your blood pressure (BP) was elevated above 135/85 this visit, please have this repeated by your doctor within one month. ---------------

## 2016-06-24 NOTE — ED Triage Notes (Signed)
C/o prod cough, vomiting x 1 week-dx with flu last week at [facility]-rx tamiflu-did not take all of med-NAD-steady gait

## 2016-07-06 ENCOUNTER — Emergency Department (HOSPITAL_BASED_OUTPATIENT_CLINIC_OR_DEPARTMENT_OTHER)
Admission: EM | Admit: 2016-07-06 | Discharge: 2016-07-06 | Disposition: A | Discharge status: Home / Self Care | Payer: Medicaid Other | Attending: Emergency Medicine | Admitting: Emergency Medicine

## 2016-07-06 ENCOUNTER — Encounter (HOSPITAL_BASED_OUTPATIENT_CLINIC_OR_DEPARTMENT_OTHER): Payer: Self-pay | Admitting: *Deleted

## 2016-07-06 DIAGNOSIS — D649 Anemia, unspecified: Secondary | ICD-10-CM | POA: Insufficient documentation

## 2016-07-06 DIAGNOSIS — F17210 Nicotine dependence, cigarettes, uncomplicated: Secondary | ICD-10-CM | POA: Diagnosis not present

## 2016-07-06 DIAGNOSIS — B3731 Acute candidiasis of vulva and vagina: Secondary | ICD-10-CM

## 2016-07-06 DIAGNOSIS — B373 Candidiasis of vulva and vagina: Secondary | ICD-10-CM | POA: Insufficient documentation

## 2016-07-06 DIAGNOSIS — N760 Acute vaginitis: Secondary | ICD-10-CM | POA: Diagnosis not present

## 2016-07-06 DIAGNOSIS — N898 Other specified noninflammatory disorders of vagina: Secondary | ICD-10-CM | POA: Diagnosis present

## 2016-07-06 DIAGNOSIS — B9689 Other specified bacterial agents as the cause of diseases classified elsewhere: Secondary | ICD-10-CM

## 2016-07-06 LAB — URINALYSIS, ROUTINE W REFLEX MICROSCOPIC
Bilirubin Urine: NEGATIVE
Glucose, UA: NEGATIVE mg/dL
Hgb urine dipstick: NEGATIVE
KETONES UR: NEGATIVE mg/dL
NITRITE: NEGATIVE
PH: 6 (ref 5.0–8.0)
Protein, ur: NEGATIVE mg/dL
Specific Gravity, Urine: 1.03 (ref 1.005–1.030)

## 2016-07-06 LAB — URINALYSIS, MICROSCOPIC (REFLEX): RBC / HPF: NONE SEEN RBC/hpf (ref 0–5)

## 2016-07-06 LAB — WET PREP, GENITAL
SPERM: NONE SEEN
Trich, Wet Prep: NONE SEEN

## 2016-07-06 LAB — PREGNANCY, URINE: Preg Test, Ur: NEGATIVE

## 2016-07-06 MED ORDER — AZITHROMYCIN 250 MG PO TABS
1000.0000 mg | ORAL_TABLET | Freq: Once | ORAL | Status: AC
  Administered 2016-07-06: 1000 mg via ORAL
  Filled 2016-07-06: qty 4

## 2016-07-06 MED ORDER — FLUCONAZOLE 150 MG PO TABS: 150.0000 mg | ORAL_TABLET | Freq: Every day | ORAL | 0 refills | Status: AC

## 2016-07-06 MED ORDER — METRONIDAZOLE 500 MG PO TABS: 500.0000 mg | ORAL_TABLET | Freq: Two times a day (BID) | ORAL | 0 refills | Status: DC

## 2016-07-06 MED ORDER — FLUCONAZOLE 50 MG PO TABS
150.0000 mg | ORAL_TABLET | Freq: Once | ORAL | Status: AC
  Administered 2016-07-06: 150 mg via ORAL
  Filled 2016-07-06: qty 1

## 2016-07-06 MED ORDER — CEFTRIAXONE SODIUM 250 MG IJ SOLR
250.0000 mg | Freq: Once | INTRAMUSCULAR | Status: AC
  Administered 2016-07-06: 250 mg via INTRAMUSCULAR
  Filled 2016-07-06: qty 250

## 2016-07-06 NOTE — ED Notes (Signed)
Patient is not sitting in waiting room. Outside with child in car, significant other in the waiting room

## 2016-07-06 NOTE — ED Triage Notes (Signed)
pt c/o vaginal discharge x 2 days

## 2016-07-06 NOTE — ED Notes (Signed)
ED Provider at bedside. 

## 2016-07-06 NOTE — ED Provider Notes (Signed)
[facility] Provider Note   CSN: [id number] Arrival date & time: 07/06/16  1825  By signing my name below, I, Vista Mink, attest that this documentation has been prepared under the direction and in the presence of [facility], PA-C.  Electronically Signed: Vista Mink, ED Scribe. 07/06/16. 9:23 PM.   History   Chief Complaint Chief Complaint  Patient presents with  . Vaginal Discharge    HPI HPI Comments: Elizabeth Mendoza is a 36 y.o. female who presents to the Emergency Department complaining of vaginal discharge with associated vaginal itching that started two days ago. Pt reports thick, white vaginal discharge and also notes that she has had vaginal dryness. She is sexually active with one partner, her boyfriend, whom she states came with her because he was also having discharge (penile.) She denies any other sexual partners. No fever/chills, dysuria or back pain.  The history is provided by the patient. No language interpreter was used.    Past Medical History:  Diagnosis Date  . Anemia   . Anxiety   . Depression   . Gonorrhea   . Hernia of abdominal wall   . Shortness of breath   . Skin abnormalities    Hiradenitis suppurativa  . Tooth decay   . Ulcer     Patient Active Problem List   Diagnosis Date Noted  . 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 Hidradenitis suppurativa of left axilla 12/24/2014  . Acute blood loss anemia 12/24/2014  . Eczema 12/20/2013    Past Surgical History:  Procedure Laterality Date  . DILATION AND CURETTAGE OF UTERUS    . INDUCED ABORTION    . LAPAROSCOPY N/A 12/23/2014   Procedure: LAPAROSCOPY OPERATIVE, Right Salpingectomy with Removal Ectopic Pregnancy, Fulguration Left Fallopian Tube, Cystotomy Right Ovarian Cyst;  Surgeon: Lesly Dukes, MD;  Location: [facility];  Service: Gynecology;  Laterality: N/A;  . TUBAL LIGATION      OB History    Gravida Para Term Preterm AB Living   10 5  5  0 5 5   SAB TAB Ectopic Multiple Live Births   1 3 1  0 5       Home Medications    Prior to Admission medications   Medication Sig Start Date End Date Taking? Authorizing Provider  fluconazole (DIFLUCAN) 150 MG tablet Take 1 tablet (150 mg total) by mouth daily. Take 1 tablet by mouth once in one week if symptoms persist. 07/06/16 07/07/16  Amarri Satterly, Chase Picket, PA-C  guaiFENesin (ROBITUSSIN) 100 MG/5ML liquid Take 5-10 mLs (100-200 mg total) by mouth every 4 (four) hours as needed for cough. 06/24/16   Audry Pili, PA-C  metroNIDAZOLE (FLAGYL) 500 MG tablet Take 1 tablet (500 mg total) by mouth 2 (two) times daily. 07/06/16   Marionna Gonia, Chase Picket, PA-C    Family History Family History  Problem Relation Age of Onset  . Diabetes Father   . Anesthesia problems Neg Hx     Social History Social History  Substance Use Topics  . Smoking status: Current Some Day Smoker    Years: 15.00    Types: Cigarettes  . Smokeless tobacco: Never Used  . Alcohol use No    Allergies   Aspirin   Review of Systems Review of Systems  Constitutional: Negative for fever.  Genitourinary: Positive for vaginal discharge. Negative for dysuria and hematuria.       Vaginal itching  All other systems reviewed and are negative.  Physical Exam Updated Vital Signs BP 104/65 (BP Location: Right Arm)   Pulse (!) 109   Temp 98.5 F (36.9 C) (Oral)   Resp 20   Ht 5\' 4"  (1.626 m)   Wt 102.5 kg (226 lb)   LMP 06/23/2016   SpO2 100%   BMI 38.79 kg/m   Physical Exam  Constitutional: She is oriented to person, place, and time. She appears well-developed and well-nourished. No distress.  HENT:  Head: Normocephalic and atraumatic.  Cardiovascular: Normal rate, regular rhythm and normal heart sounds.   No murmur heard. Pulmonary/Chest: Effort normal and breath sounds normal. No respiratory distress.  Abdominal: Soft. She exhibits no distension. There is no tenderness.  Genitourinary:  Genitourinary  Comments: Chaperone present for exam. + thick white discharge. No CMT. No adnexal masses, tenderness, or fullness.  No bleeding within vaginal vault.  Neurological: She is alert and oriented to person, place, and time.  Skin: Skin is warm and dry.  Nursing note and vitals reviewed.    ED Treatments / Results  DIAGNOSTIC STUDIES: Oxygen Saturation is 100% on RA, normal by my interpretation.  COORDINATION OF CARE: 9:22 PM-Discussed treatment plan with pt at bedside and pt agreed to plan.   Labs (all labs ordered are listed, but only abnormal results are displayed) Labs Reviewed  WET PREP, GENITAL - Abnormal; Notable for the following:       Result Value   Yeast Wet Prep HPF POC PRESENT (*)    Clue Cells Wet Prep HPF POC PRESENT (*)    WBC, Wet Prep HPF POC MANY (*)    All other components within normal limits  URINALYSIS, ROUTINE W REFLEX MICROSCOPIC - Abnormal; Notable for the following:    APPearance TURBID (*)    Leukocytes, UA LARGE (*)    All other components within normal limits  URINALYSIS, MICROSCOPIC (REFLEX) - Abnormal; Notable for the following:    Bacteria, UA FEW (*)    Squamous Epithelial / LPF 6-30 (*)    All other components within normal limits  PREGNANCY, URINE  GC/CHLAMYDIA PROBE AMP ([place]) NOT AT [facility]    EKG  EKG Interpretation None       Radiology No results found.  Procedures Procedures (including critical care time)  Medications Ordered in ED Medications  cefTRIAXone (ROCEPHIN) injection 250 mg (250 mg Intramuscular Given 07/06/16 2207)  azithromycin (ZITHROMAX) tablet 1,000 mg (1,000 mg Oral Given 07/06/16 2207)  fluconazole (DIFLUCAN) tablet 150 mg (150 mg Oral Given 07/06/16 2207)     Initial Impression / Assessment and Plan / ED Course  I have reviewed the triage vital signs and the nursing notes.  Pertinent labs & imaging results that were available during my care of the patient were reviewed by me and considered in my medical  decision making (see chart for details).    Elizabeth Mendoza is a 36 y.o. female who presents to ED for vaginal discharge and vaginal itching. On exam, patient is afebrile, hemodynamically stable with no abdominal or CVA tenderness. GU exam with discharge but no adnexal or cervical motion tenderness.  UA with large leuks and TNTC white cells however patient with no dysuria or other urinary complaints. Wet prep shows yeast, clue cells and many wbc's. G&C obtained and patient prophylactically treated. Dose of diflucan given in ED for yeast. Flagyl for BV. OBGYN or PCP follow up encouraged. Reasons to return to ER discussed and all questions answered.    Final Clinical Impressions(s) / ED Diagnoses  Final diagnoses:  Vaginal yeast infection  BV (bacterial vaginosis)    New Prescriptions Discharge Medication List as of 07/06/2016 10:29 PM    START taking these medications   Details  fluconazole (DIFLUCAN) 150 MG tablet Take 1 tablet (150 mg total) by mouth daily. Take 1 tablet by mouth once in one week if symptoms persist., Starting Wed 07/06/2016, Until Thu 07/07/2016, Print    metroNIDAZOLE (FLAGYL) 500 MG tablet Take 1 tablet (500 mg total) by mouth 2 (two) times daily., Starting Wed 07/06/2016, Print       I personally performed the services described in this documentation, which was scribed in my presence. The recorded information has been reviewed and is accurate.     Jazzmyn Filion, Chase PicketJaime Pilcher, PA-C 07/06/16 2351    Vanetta MuldersZackowski, Scott, MD 07/09/16 1046

## 2016-07-06 NOTE — Discharge Instructions (Signed)
Please take all of your Flagyl until finished!  If your vaginal itching and thick white discharge has not resolved in one week, take the one-time dose of diflucan I have prescribed you.  Use a condom with every sexual encounter Follow up with your OBGYN in regards to today's visit. If you do not have one, you can call the clinic listed. Please return to the ER for worsening symptoms, high fevers or persistent vomiting.  You have been tested for chlamydia and gonorrhea. These results will be available in approximately 3 days. You will be notified if they are positive.   SEEK IMMEDIATE MEDICAL CARE IF:  You develop an oral temperature above 102 F (38.9 C), not controlled by medications or lasting more than 2 days.  You develop an increase in pain.  You develop vaginal bleeding and it is not time for your period.  You develop painful intercourse.

## 2016-07-08 LAB — GC/CHLAMYDIA PROBE AMP (~~LOC~~) NOT AT ARMC
CHLAMYDIA, DNA PROBE: NEGATIVE
Neisseria Gonorrhea: NEGATIVE

## 2016-10-18 ENCOUNTER — Encounter (HOSPITAL_BASED_OUTPATIENT_CLINIC_OR_DEPARTMENT_OTHER): Payer: Self-pay

## 2016-10-18 ENCOUNTER — Emergency Department (HOSPITAL_BASED_OUTPATIENT_CLINIC_OR_DEPARTMENT_OTHER)
Admission: EM | Admit: 2016-10-18 | Discharge: 2016-10-18 | Disposition: A | Discharge status: Home / Self Care | Payer: Medicaid Other | Attending: Emergency Medicine | Admitting: Emergency Medicine

## 2016-10-18 DIAGNOSIS — F17210 Nicotine dependence, cigarettes, uncomplicated: Secondary | ICD-10-CM | POA: Diagnosis not present

## 2016-10-18 DIAGNOSIS — R1013 Epigastric pain: Secondary | ICD-10-CM | POA: Insufficient documentation

## 2016-10-18 DIAGNOSIS — R1033 Periumbilical pain: Secondary | ICD-10-CM | POA: Diagnosis present

## 2016-10-18 LAB — COMPREHENSIVE METABOLIC PANEL
ALBUMIN: 3.8 g/dL (ref 3.5–5.0)
ALT: 13 U/L — ABNORMAL LOW (ref 14–54)
ANION GAP: 9 (ref 5–15)
AST: 15 U/L (ref 15–41)
Alkaline Phosphatase: 62 U/L (ref 38–126)
BUN: 13 mg/dL (ref 6–20)
CHLORIDE: 104 mmol/L (ref 101–111)
CO2: 25 mmol/L (ref 22–32)
Calcium: 8.4 mg/dL — ABNORMAL LOW (ref 8.9–10.3)
Creatinine, Ser: 0.6 mg/dL (ref 0.44–1.00)
GFR calc Af Amer: >60 mL/min (ref >60)
GFR calc non Af Amer: >60 mL/min (ref >60)
Glucose, Bld: 91 mg/dL (ref 65–99)
POTASSIUM: 3 mmol/L — AB (ref 3.5–5.1)
SODIUM: 138 mmol/L (ref 135–145)
TOTAL PROTEIN: 7.6 g/dL (ref 6.5–8.1)
Total Bilirubin: 0.6 mg/dL (ref 0.3–1.2)

## 2016-10-18 LAB — CBC
HEMATOCRIT: 31.6 % — AB (ref 36.0–46.0)
HEMOGLOBIN: 10.5 g/dL — AB (ref 12.0–15.0)
MCH: 29 pg (ref 26.0–34.0)
MCHC: 33.2 g/dL (ref 30.0–36.0)
MCV: 87.3 fL (ref 78.0–100.0)
Platelets: 300 10*3/uL (ref 150–400)
RBC: 3.62 MIL/uL — ABNORMAL LOW (ref 3.87–5.11)
RDW: 14.9 % (ref 11.5–15.5)
WBC: 6.7 10*3/uL (ref 4.0–10.5)

## 2016-10-18 LAB — LIPASE, BLOOD: LIPASE: 25 U/L (ref 11–51)

## 2016-10-18 MED ORDER — FAMOTIDINE 20 MG PO TABS
20.0000 mg | ORAL_TABLET | Freq: Once | ORAL | Status: AC
  Administered 2016-10-18: 20 mg via ORAL
  Filled 2016-10-18: qty 1

## 2016-10-18 MED ORDER — FAMOTIDINE 40 MG PO TABS: 40.0000 mg | ORAL_TABLET | Freq: Every day | ORAL | 0 refills | Status: DC

## 2016-10-18 MED ORDER — GI COCKTAIL ~~LOC~~
30.0000 mL | Freq: Once | ORAL | Status: AC
  Administered 2016-10-18: 30 mL via ORAL
  Filled 2016-10-18: qty 30

## 2016-10-18 MED ORDER — ONDANSETRON 4 MG PO TBDP
4.0000 mg | ORAL_TABLET | Freq: Once | ORAL | Status: AC
  Administered 2016-10-18: 4 mg via ORAL
  Filled 2016-10-18: qty 1

## 2016-10-18 NOTE — ED Triage Notes (Signed)
C/o abd pain x 1 month-NAD-steady gait

## 2016-10-18 NOTE — ED Provider Notes (Signed)
[facility]   CSN: [id number] Arrival date & time: 10/18/16  1530     History   Chief Complaint Chief Complaint  Patient presents with  . Abdominal Pain    HPI Elizabeth Mendoza is a 36 y.o. female.  The history is provided by the patient.  Abdominal Pain   This is a chronic problem. Episode onset: 2 yrs. Episode frequency: intermittent. The problem has not changed since onset.The pain is associated with eating. The pain is located in the periumbilical region. The pain is moderate. Associated symptoms include nausea. Pertinent negatives include anorexia, fever, diarrhea, hematochezia, melena, vomiting and dysuria. The symptoms are aggravated by eating (spicy and fried foods; immediately after eating). Nothing relieves the symptoms.    Past Medical History:  Diagnosis Date  . Anemia   . Anxiety   . Depression   . Gonorrhea   . Hernia of abdominal wall   . Shortness of breath   . Skin abnormalities    Hiradenitis suppurativa  . Tooth decay   . Ulcer     Patient Active Problem List   Diagnosis Date Noted  . 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 Hidradenitis suppurativa of left axilla 12/24/2014  . Acute blood loss anemia 12/24/2014  . Eczema 12/20/2013    Past Surgical History:  Procedure Laterality Date  . DILATION AND CURETTAGE OF UTERUS    . INDUCED ABORTION    . LAPAROSCOPY N/A 12/23/2014   Procedure: LAPAROSCOPY OPERATIVE, Right Salpingectomy with Removal Ectopic Pregnancy, Fulguration Left Fallopian Tube, Cystotomy Right Ovarian Cyst;  Surgeon: Lesly Dukes, MD;  Location: [facility];  Service: Gynecology;  Laterality: N/A;  . TUBAL LIGATION      OB History    Gravida Para Term Preterm AB Living   0 5 5   SAB TAB Ectopic Multiple Live Births   0 5       Home Medications    Prior to Admission medications   Medication Sig Start Date End Date Taking? Authorizing Provider    famotidine (PEPCID) 40 MG tablet Take 1 tablet (40 mg total) by mouth daily. 10/18/16 11/17/16  [name], MD    Family History Family History  Problem Relation Age of Onset  . Diabetes Father   . Anesthesia problems Neg Hx     Social History Social History  Substance Use Topics  . Smoking status: Current Every Day Smoker    Years: 15.00    Types: Cigarettes  . Smokeless tobacco: Never Used  . Alcohol use Yes     Comment: occ     Allergies   Aspirin   Review of Systems Review of Systems  Constitutional: Negative for fever.  Gastrointestinal: Positive for abdominal pain and nausea. Negative for anorexia, diarrhea, hematochezia, melena and vomiting.  Genitourinary: Negative for dysuria.  All other systems are reviewed and are negative for acute change except as noted in the HPI    Physical Exam Updated Vital Signs BP 130/81 (BP Location: Left Arm)   Pulse 95   Temp 98.8 F (37.1 C) (Oral)   Resp 18   Ht  (1.626 m)   Wt 101 kg (222 lb 10.6 oz)   LMP 10/07/2016   SpO2 99%   BMI 38.22 kg/m   Physical Exam  Constitutional: She is oriented to person, place, and time. She appears well-developed and well-nourished. No distress.  HENT:  Head: Normocephalic  and atraumatic.  Nose: Nose normal.  Eyes: Pupils are equal, round, and reactive to light. Conjunctivae and EOM are normal. Right eye exhibits no discharge. Left eye exhibits no discharge. No scleral icterus.  Neck: Normal range of motion. Neck supple.  Cardiovascular: Normal rate and regular rhythm.  Exam reveals no gallop and no friction rub.   No murmur heard. Pulmonary/Chest: Effort normal and breath sounds normal. No stridor. No respiratory distress. She has no rales.  Abdominal: Soft. She exhibits no distension. There is tenderness in the epigastric area and periumbilical area. There is no rigidity, no rebound, no guarding, no CVA tenderness and negative Murphy's sign.  Musculoskeletal: She  exhibits no edema or tenderness.  Neurological: She is alert and oriented to person, place, and time.  Skin: Skin is warm and dry. No rash noted. She is not diaphoretic. No erythema.  Psychiatric: She has a normal mood and affect.  Vitals reviewed.    ED Treatments / Results  Labs (all labs ordered are listed, but only abnormal results are displayed) Labs Reviewed  COMPREHENSIVE METABOLIC PANEL - Abnormal; Notable for the following:       Result Value   Potassium 3.0 (*)    Calcium 8.4 (*)    ALT 13 (*)    All other components within normal limits  CBC - Abnormal; Notable for the following:    RBC 3.62 (*)    Hemoglobin 10.5 (*)    HCT 31.6 (*)    All other components within normal limits  LIPASE, BLOOD    EKG  EKG Interpretation None       Radiology No results found.  Procedures Procedures (including critical care time)  Medications Ordered in ED Medications  gi cocktail (Maalox,Lidocaine,Donnatal) (30 mLs Oral Given 10/18/16 1713)  ondansetron (ZOFRAN-ODT) disintegrating tablet 4 mg (4 mg Oral Given 10/18/16 1735)  famotidine (PEPCID) tablet 20 mg (20 mg Oral Given 10/18/16 1734)     Initial Impression / Assessment and Plan / ED Course  I have reviewed the triage vital signs and the nursing notes.  Pertinent labs & imaging results that were available during my care of the patient were reviewed by me and considered in my medical decision making (see chart for details).     Epigastric abdominal discomfort most consistent with dyspepsia/gastritis. Labs grossly reassuring without evidence of pancreatitis, biliary obstruction. Abdomen with mild discomfort but no evidence of peritonitis. Low suspicion for serious intra-abdominal inflammatory/infectious process. No indication for imaging at this time. Doubt cardiac etiology.  Provided with GI cocktail resulting in mild improvement. Given pepcid.  The patient is safe for discharge with strict return  precautions.   Final Clinical Impressions(s) / ED Diagnoses   Final diagnoses:  Dyspepsia   Disposition: Discharge  Condition: Good  I have discussed the results, Dx and Tx plan with the patient who expressed understanding and agree(s) with the plan. Discharge instructions discussed at great length. The patient was given strict return precautions who verbalized understanding of the instructions. No further questions at time of discharge.    Discharge Medication List as of 10/18/2016  5:27 PM    START taking these medications   Details  famotidine (PEPCID) 40 MG tablet Take 1 tablet (40 mg total) by mouth daily., Starting Tue 10/18/2016, Until Thu 11/17/2016, Print        Follow Up: Primary care provider  Schedule an appointment as soon as possible for a visit  As needed      Cardama, Amadeo Garnet,  MD 10/19/16 0006

## 2016-10-18 NOTE — ED Notes (Signed)
Pt reports the GI cocktail is making her stomach hurt worse and she is more nauseated. EDP notified.

## 2016-10-31 IMAGING — US US OB TRANSVAGINAL
1 series · 13 of 28 positions shown · non-contrast
Comparison: None relevant; second trimester OB ultrasound
09/24/2013.

CLINICAL DATA: 33-year-old female with right lower quadrant pelvic
pain and bleeding since this morning. Quantitative beta HCG 6199.
Gestational age by LMP 4 weeks 2 days. Initial encounter.

EXAM:
OBSTETRIC <14 WK US AND TRANSVAGINAL OB US
TECHNIQUE: Both transabdominal and transvaginal ultrasound examinations were
performed for complete evaluation of the gestation as well as the
maternal uterus, adnexal regions, and pelvic cul-de-sac.
Transvaginal technique was performed to assess early pregnancy.

[Series 1: us ob transvaginal · 0.23mm/px · 13 of 84 slices shown]
[im 4/84]
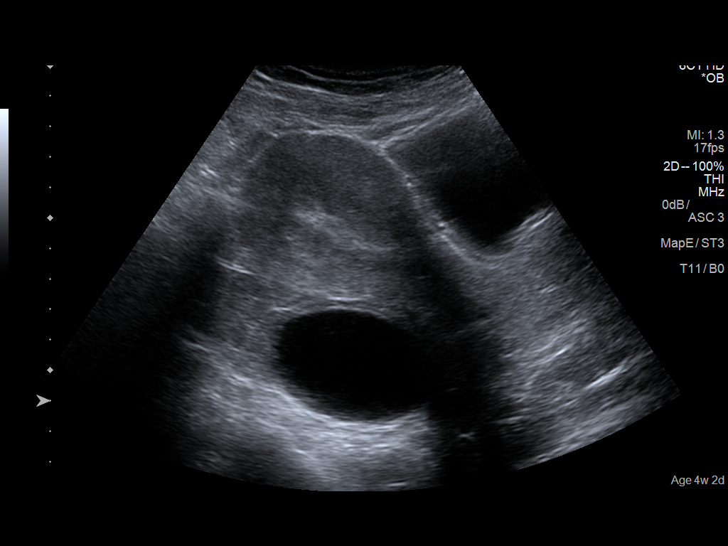
[im 10/84]
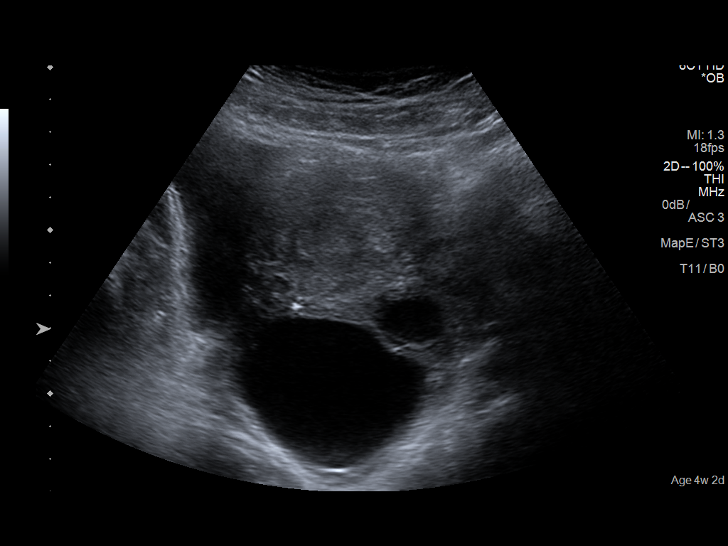
[im 16/84]
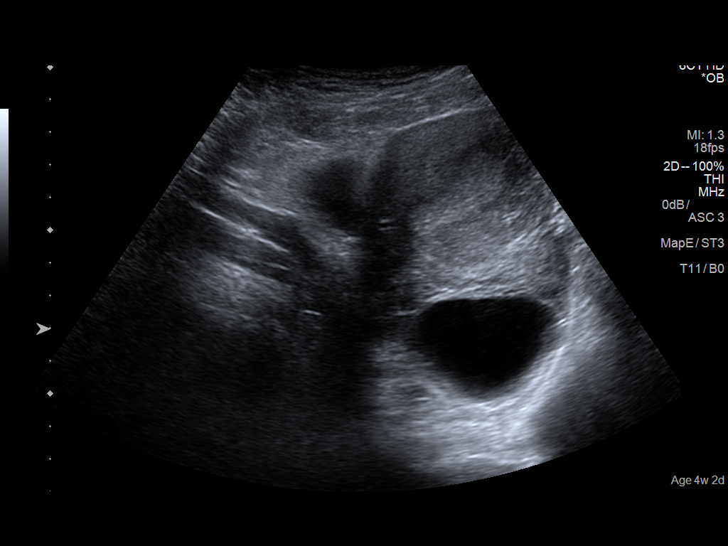
[im 22/84]
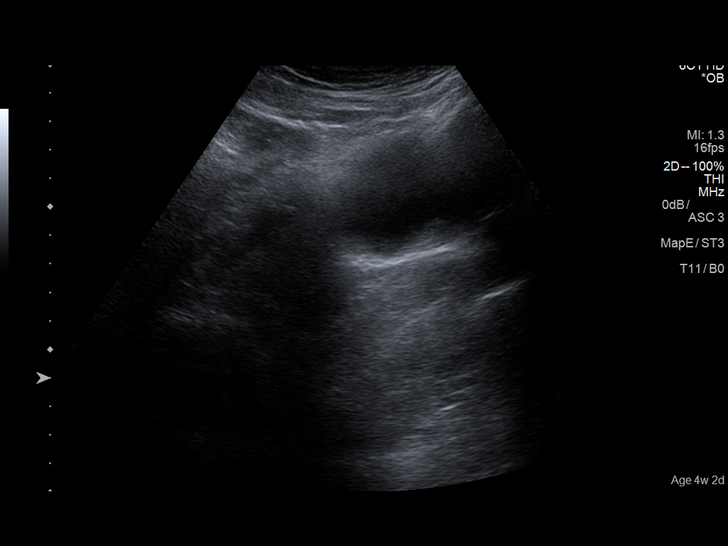
[im 28/84]
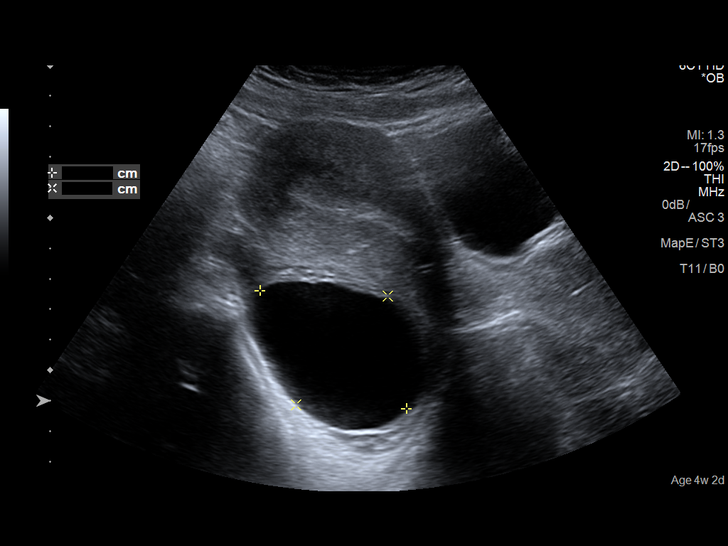
[im 34/84]
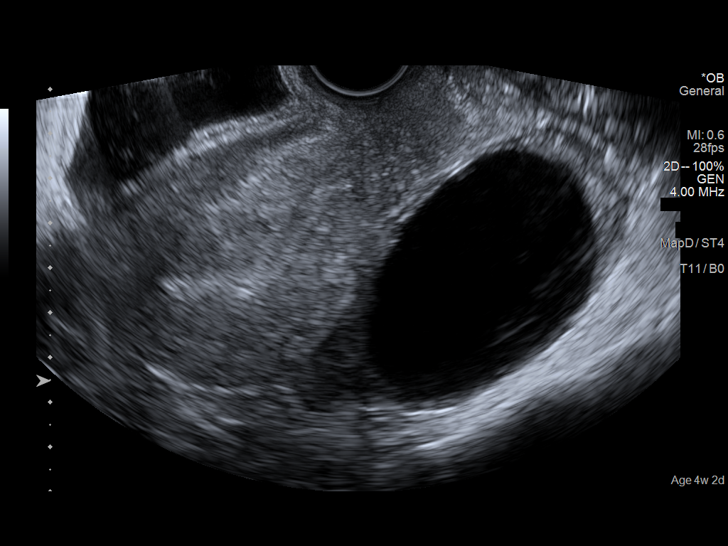
[im 44/84]
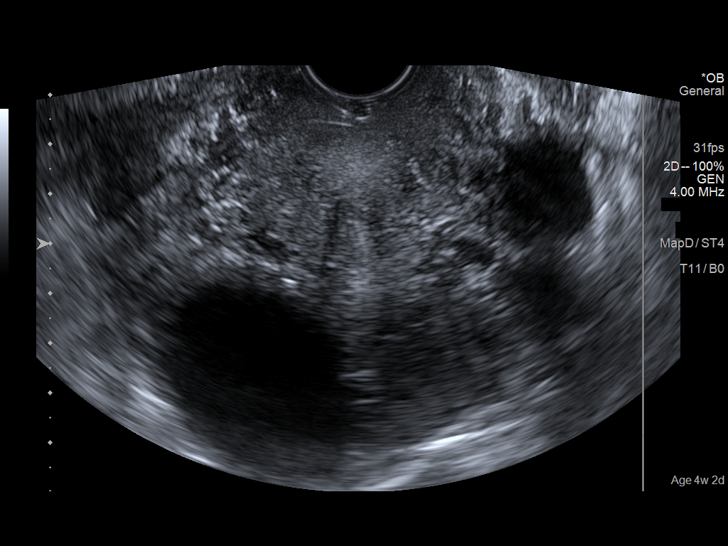
[im 50/84]
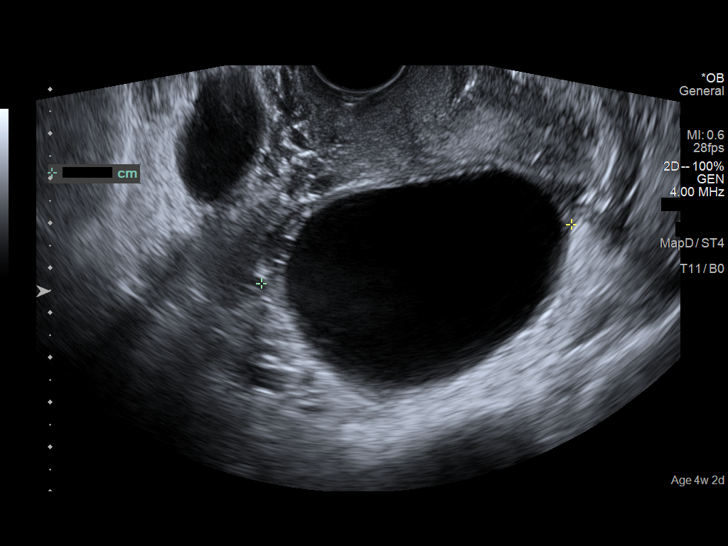
[im 56/84]
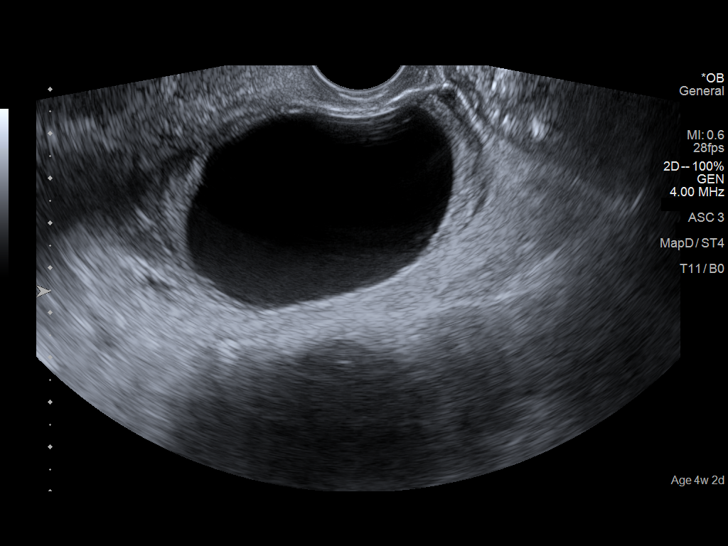
[im 62/84]
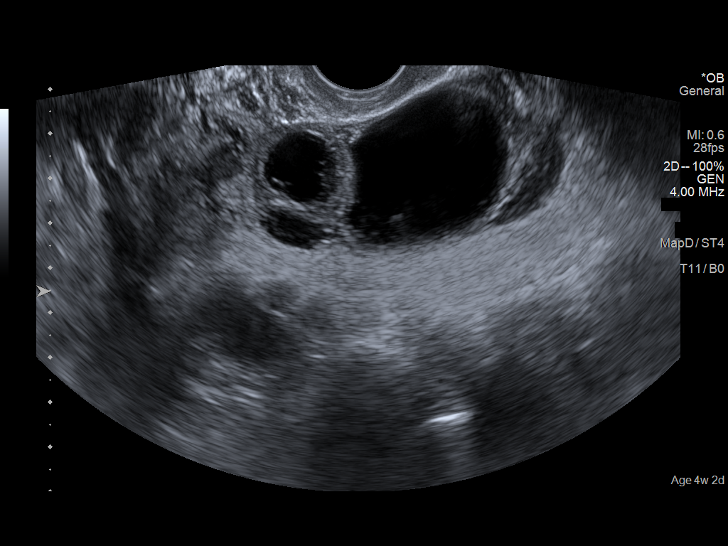
[im 68/84]
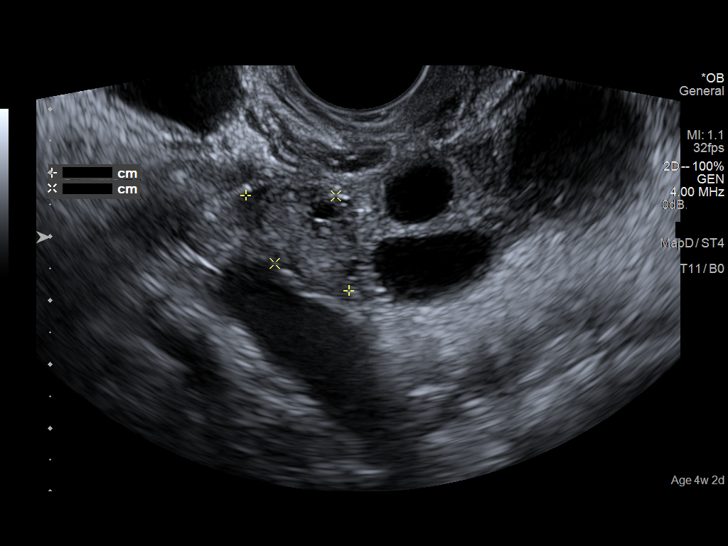
[im 74/84]
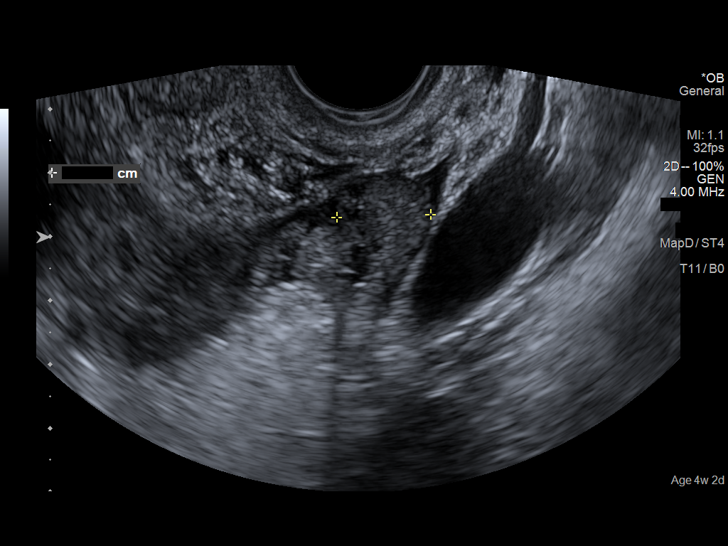
[im 80/84]
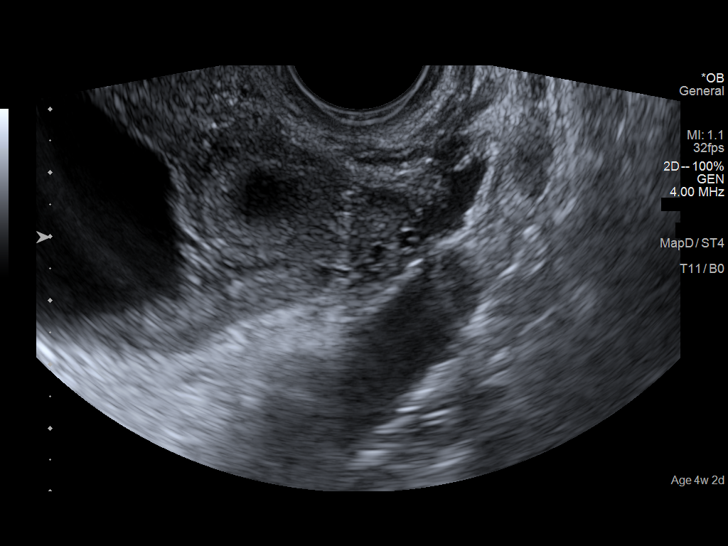

[13 of 28 positions shown; findings below may reference images not displayed]

FINDINGS: Intrauterine gestational sac: Not visualize

Yolk sac:  Not visualize

Embryo:  Not visualize

Cardiac Activity: Not detected

Maternal uterus/adnexae: The endometrium measures 1.8 cm in
thickness but appears bland. The uterus measures 8.0 x 6.3 x 5.4 cm.

There is a large 6.5 cm simple appearing cyst in the region of the
right adnexa (image 55). No solid or vascular elements. Superimposed
more complex appearing thick walled cyst measuring up to 2.2 cm
(image 70). This has only mild peripheral vascularity (image 69) and
no convincing internal vascularity.

The left ovary measures 2.2 x 1.4 x 1.5 cm and is unremarkable.
There is a small volume of free fluid adjacent to the right adnexa.
IMPRESSION: 1. No IUP identified. Differential considerations include failed
IUP, ectopic (right adnexa), and early/occult IUP (felt least likely
given current quantitative beta HCG level).
2. Negative left ovary. Two right adnexal cysts, the larger is
cm and simple. The smaller is 2.2 cm and thick walled, but with no
convincing internal solid or vascular elements, and might be a
corpus luteum.
3. Trace free fluid adjacent to the right ovary.

## 2016-11-24 IMAGING — US US OB TRANSVAGINAL
1 series · 15 of 28 positions shown · non-contrast
Comparison: Pelvic ultrasound 12/17/2014.

CLINICAL DATA: Patient with pregnancy of unknown location.

EXAM:
OBSTETRIC <14 WK US AND TRANSVAGINAL OB US
TECHNIQUE: Both transabdominal and transvaginal ultrasound examinations were
performed for complete evaluation of the gestation as well as the
maternal uterus, adnexal regions, and pelvic cul-de-sac.
Transvaginal technique was performed to assess early pregnancy.

[Series 1: us ob transvaginal · 15 of 91 slices shown]
[im 1/91]
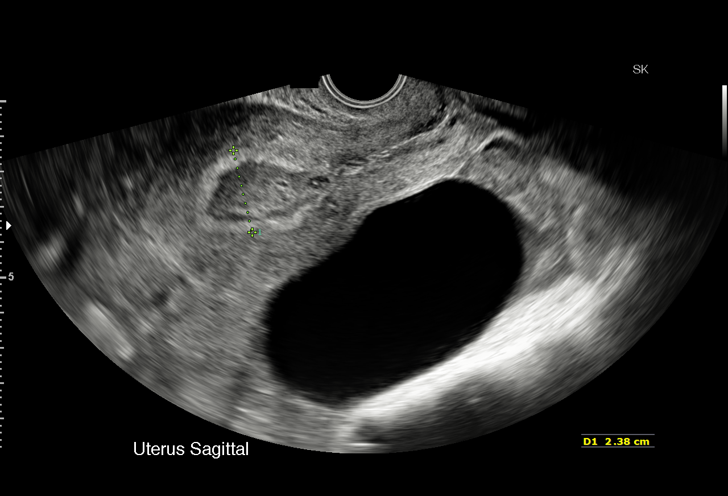
[im 7/91]
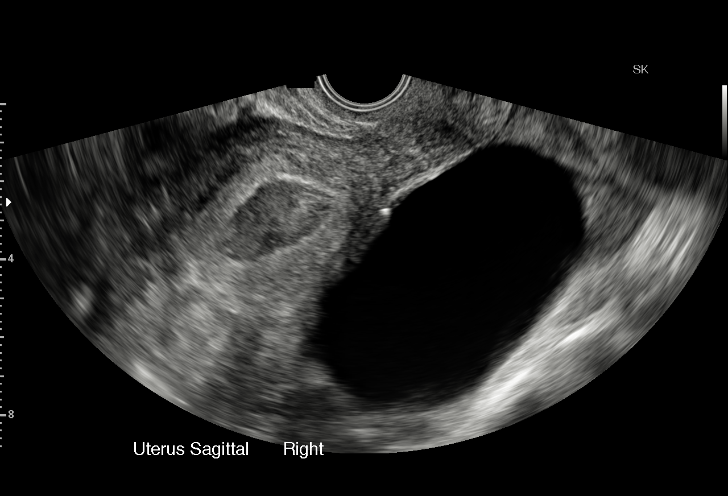
[im 14/91]
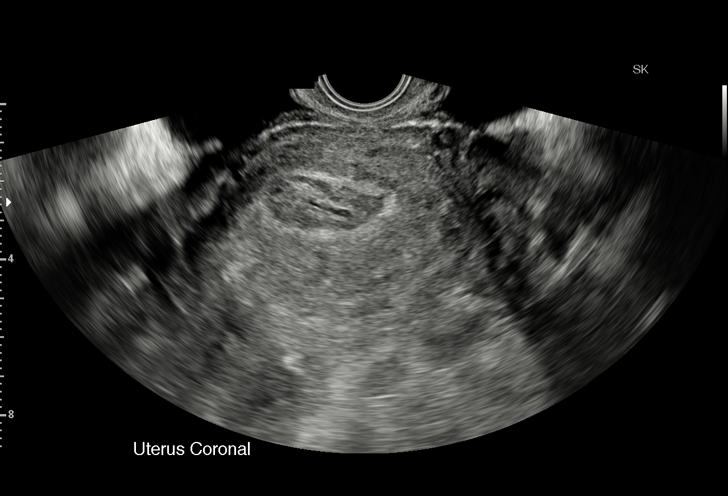
[im 21/91]
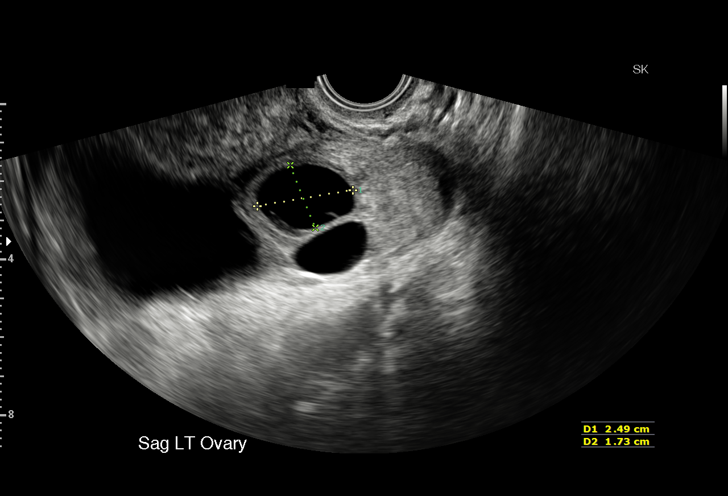
[im 27/91]
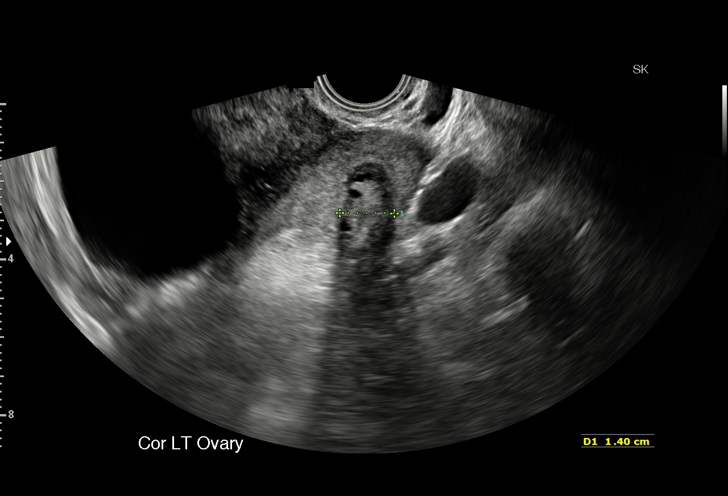
[im 34/91]
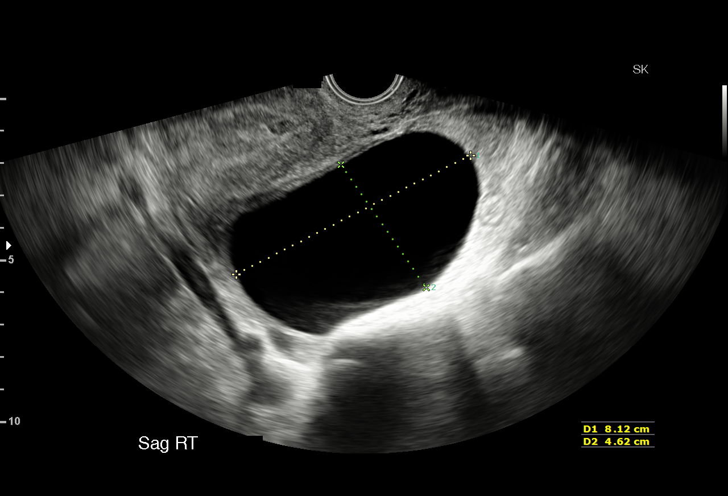
[im 41/91]
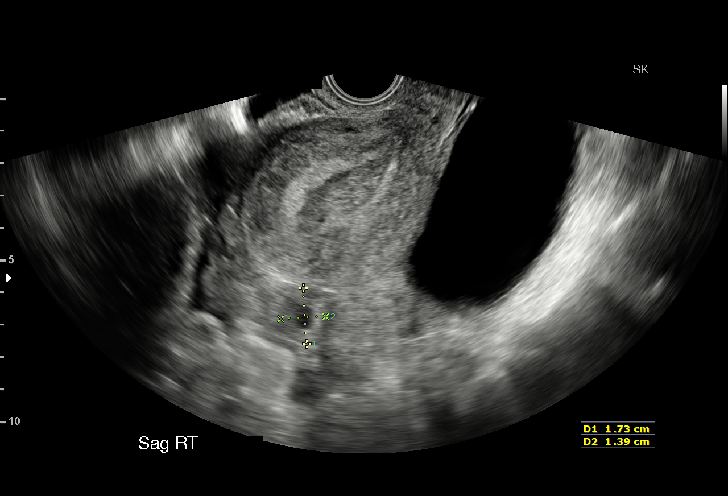
[im 47/91]
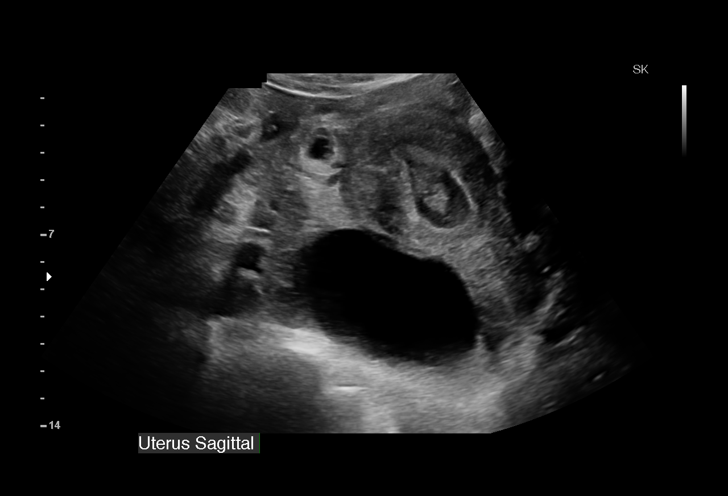
[im 51/91]
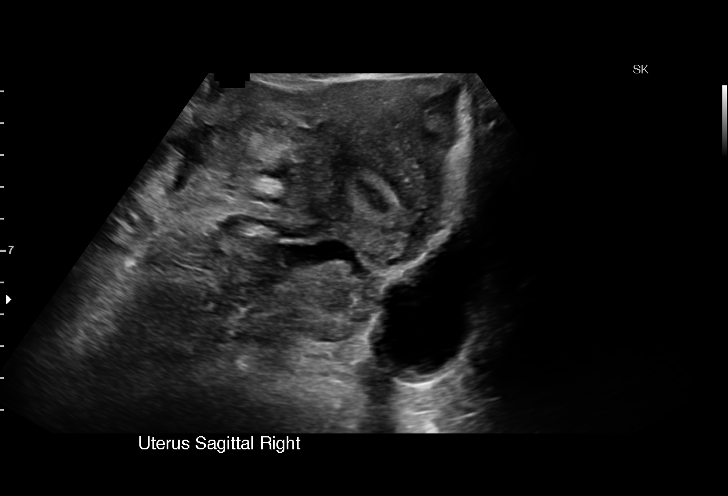
[im 57/91]
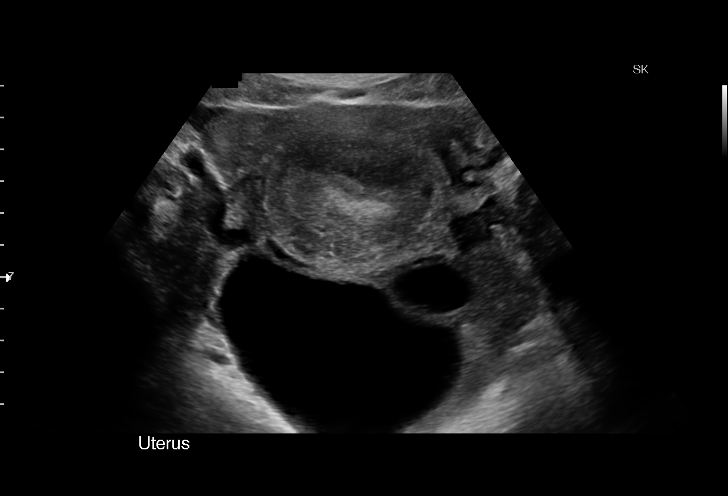
[im 64/91]
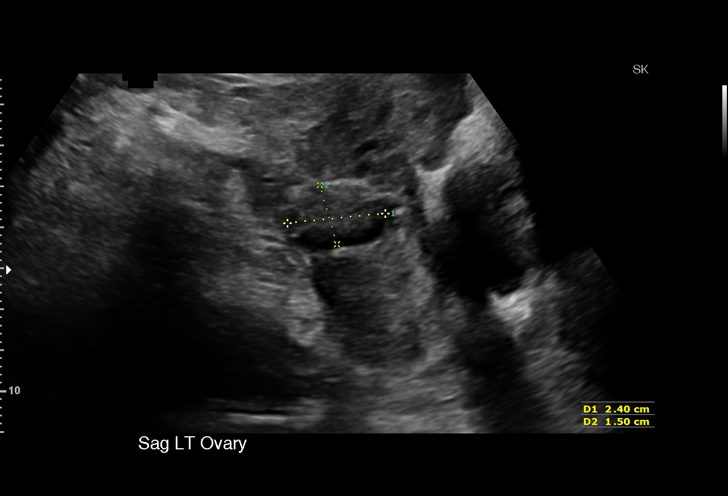
[im 71/91]
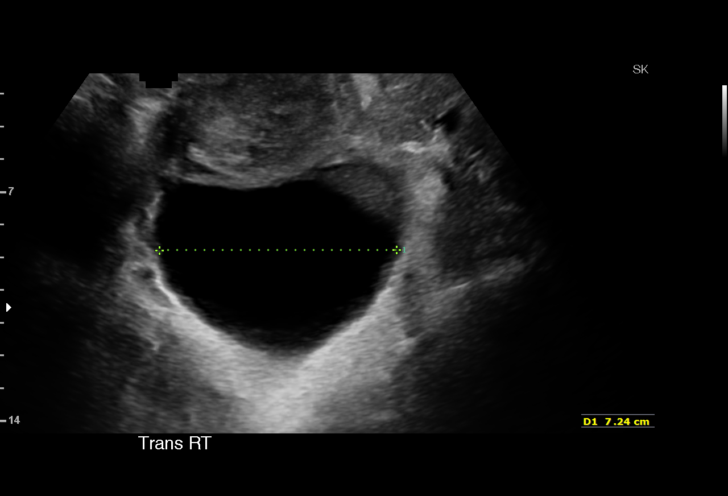
[im 77/91]
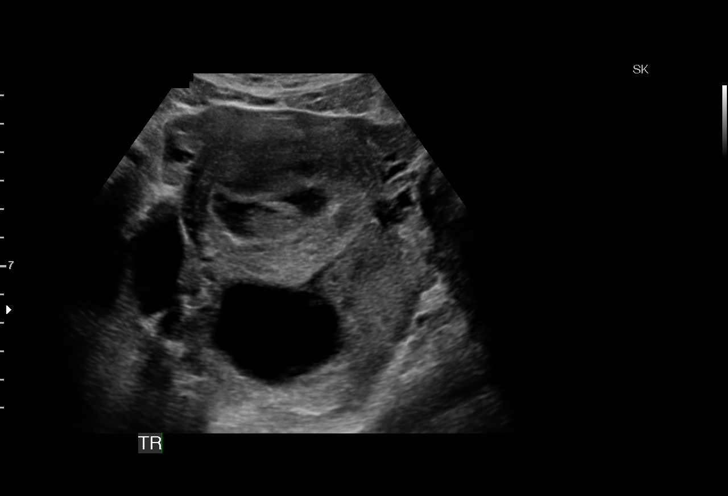
[im 84/91]
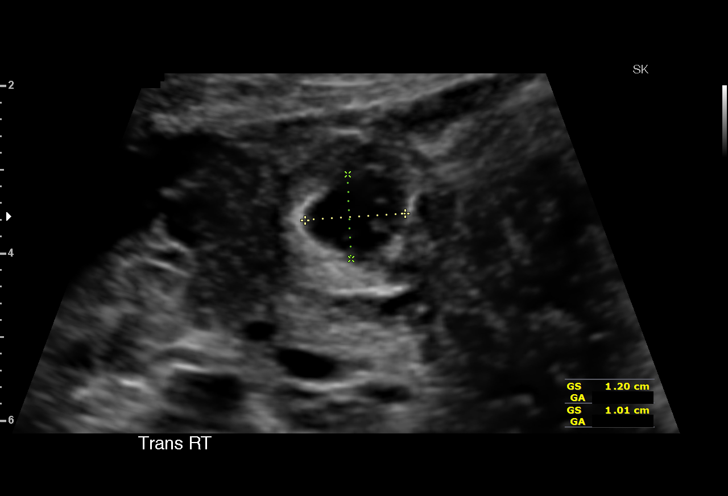
[im 91/91]
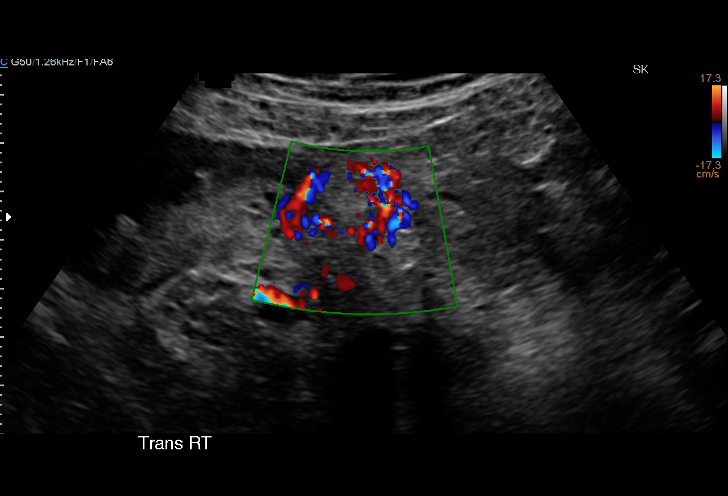

[15 of 28 positions shown; findings below may reference images not displayed]

FINDINGS: Intrauterine gestational sac: Not present

Yolk sac:  Not present

Embryo:  Not present

Cardiac Activity: Not present

Maternal uterus/adnexae: Multiple large cysts (measuring up to
cm) are demonstrated within the right adnexa. Additionally within
the right adnexa there is a gestational sac containing a yolk sac
compatible with ectopic pregnancy. The left ovary is grossly
unremarkable in appearance, containing a few prominent follicles.
There is a large amount of complex fluid within the pelvis. The
endometrial cavity is expanded with heterogeneous material.
IMPRESSION: Findings compatible with ectopic pregnancy within the right adnexa.
There is a large amount of complex fluid within the pelvis most
compatible with blood products.

Heterogeneous material within the endometrial cavity suggestive of
blood products.

Critical Value/emergent results were called by telephone at the time
of interpretation on 12/23/2014 at [DATE] to Dr. Ferienhaus, who
verbally acknowledged these results.

## 2017-02-22 ENCOUNTER — Encounter (HOSPITAL_BASED_OUTPATIENT_CLINIC_OR_DEPARTMENT_OTHER): Payer: Self-pay | Admitting: *Deleted

## 2017-02-22 ENCOUNTER — Emergency Department (HOSPITAL_BASED_OUTPATIENT_CLINIC_OR_DEPARTMENT_OTHER)
Admission: EM | Admit: 2017-02-22 | Discharge: 2017-02-22 | Disposition: A | Discharge status: Home / Self Care | Payer: Medicaid Other | Attending: Physician Assistant | Admitting: Physician Assistant

## 2017-02-22 ENCOUNTER — Other Ambulatory Visit: Payer: Self-pay

## 2017-02-22 DIAGNOSIS — L0291 Cutaneous abscess, unspecified: Secondary | ICD-10-CM

## 2017-02-22 DIAGNOSIS — L02412 Cutaneous abscess of left axilla: Secondary | ICD-10-CM | POA: Insufficient documentation

## 2017-02-22 MED ORDER — LIDOCAINE HCL 2 % IJ SOLN
INTRAMUSCULAR | Status: AC
  Filled 2017-02-22: qty 20

## 2017-02-22 MED ORDER — LIDOCAINE HCL (PF) 2 % IJ SOLN: 0.0000 mL | Freq: Once | INTRAMUSCULAR | Status: DC | PRN

## 2017-02-22 MED ORDER — CLINDAMYCIN HCL 150 MG PO CAPS: 450.0000 mg | ORAL_CAPSULE | Freq: Three times a day (TID) | ORAL | 0 refills | Status: AC

## 2017-02-22 NOTE — Discharge Instructions (Signed)
You have declined the definitive management for this issue, which is incision and drainage. You are being given a course of antibiotics, which may or may not help. Antiinflammatory medications: Take 600 mg of ibuprofen every 6 hours or 440 mg (over the counter dose) to 500 mg (prescription dose) of naproxen every 12 hours for the next 3 days. After this time, these medications may be used as needed for pain. Take these medications with food to avoid upset stomach. Choose only one of these medications, do not take them together. Tylenol: Should you continue to have additional pain while taking the ibuprofen or naproxen, you may add in tylenol as needed. Your daily total maximum amount of tylenol from all sources should be limited to 4000mg /day for persons without liver problems, or 2000mg /day for those with liver problems.  Follow-up with primary care provider on this matter and for any further issues of this type.

## 2017-02-22 NOTE — ED Triage Notes (Signed)
Pt c/o abscess x 3 days

## 2017-02-22 NOTE — ED Provider Notes (Signed)
[facility] EMERGENCY DEPARTMENT Provider Note   CSN: [id number] Arrival date & time: 02/22/17  1721     History   Chief Complaint Chief Complaint  Patient presents with  . Abscess    HPI Elizabeth Mendoza is a 37 y.o. female.  HPI   Elizabeth Mendoza is a 37 y.o. female, with a history of hidradenitis, presenting to the ED with an abscess for last three days in left axilla. Took ibuprofen this morning.  Denies fever, numbness, weakness, shortness of breath, drainage from the region, or any other complaints.    Past Medical History:  Diagnosis Date  . Anemia   . Anxiety   . Depression   . Gonorrhea   . Hernia of abdominal wall   . Shortness of breath   . Skin abnormalities    Hiradenitis suppurativa  . Tooth decay   . Ulcer     Patient Active Problem List   Diagnosis Date Noted  . 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 Hidradenitis suppurativa of left axilla 12/24/2014  . Acute blood loss anemia 12/24/2014  . Eczema 12/20/2013    Past Surgical History:  Procedure Laterality Date  . DILATION AND CURETTAGE OF UTERUS    . INDUCED ABORTION    . LAPAROSCOPY N/A 12/23/2014   Procedure: LAPAROSCOPY OPERATIVE, Right Salpingectomy with Removal Ectopic Pregnancy, Fulguration Left Fallopian Tube, Cystotomy Right Ovarian Cyst;  Surgeon: Lesly Dukes, MD;  Location: [facility];  Service: Gynecology;  Laterality: N/A;  . TUBAL LIGATION      OB History    Gravida Para Term Preterm AB Living   10 5 5  0 5 5   SAB TAB Ectopic Multiple Live Births   1 3 1  0 5       Home Medications    Prior to Admission medications   Medication Sig Start Date End Date Taking? Authorizing Provider  clindamycin (CLEOCIN) 150 MG capsule Take 3 capsules (450 mg total) by mouth 3 (three) times daily for 5 days. 02/22/17 02/27/17  Anselm Pancoast, PA-C    Family History Family History  Problem Relation Age of Onset  . Diabetes Father   . Anesthesia  problems Neg Hx     Social History Social History   Tobacco Use  . Smoking status: Current Every Day Smoker    Packs/day: 0.50    Years: 15.00    Pack years: 7.50    Types: Cigarettes  . Smokeless tobacco: Never Used  Substance Use Topics  . Alcohol use: Yes    Comment: occ  . Drug use: No     Allergies   Aspirin   Review of Systems Review of Systems  Constitutional: Negative for fever.  Skin:       Suspected abscess to left axilla.  Neurological: Negative for weakness and numbness.     Physical Exam Updated Vital Signs BP (!) 121/98 (BP Location: Right Arm)   Pulse 94   Temp 98.8 F (37.1 C) (Oral)   Resp 20   Ht 5\' 4"  (1.626 m)   Wt 100.4 kg (221 lb 5 oz)   LMP 01/16/2017   SpO2 100%   BMI 37.99 kg/m   Physical Exam  Constitutional: She appears well-developed and well-nourished. No distress.  HENT:  Head: Normocephalic and atraumatic.  Eyes: Conjunctivae are normal.  Neck: Neck supple.  Cardiovascular: Normal rate and regular rhythm.  Pulmonary/Chest: Effort normal.  Neurological: She is alert.  Skin: Skin is  warm and dry. She is not diaphoretic. No pallor.  Approximately 4 x 2 cm tender, fluctuant mass in the left axilla.  No open wounds.  No drainage.   Psychiatric: She has a normal mood and affect. Her behavior is normal.  Nursing note and vitals reviewed.    ED Treatments / Results  Labs (all labs ordered are listed, but only abnormal results are displayed) Labs Reviewed - No data to display  EKG  EKG Interpretation None       Radiology No results found.  Procedures Procedures (including critical care time)   EMERGENCY DEPARTMENT US SOFT TISSUE INTERPRETATION "Study: Limited Soft Tissue Ultrasound"  INDICATIONS: Pain Multiple views of the body part were obtained in real-time with a multi-frequency linear probe  PERFORMED BY: Myself IMAGES ARCHIVED?: Yes SIDE:Left BODY PART:Axilla INTERPRETATION:  Abcess  present   Medications Ordered in ED Medications - No data to display   Initial Impression / Assessment and Plan / ED Course  I have reviewed the triage vital signs and the nursing notes.  Pertinent labs & imaging results that were available during my care of the patient were reviewed by me and considered in my medical decision making (see chart for details).      Patient presents with tender, fluctuant mass to left axilla.  Ultrasound confirms fluid collection consistent with abscess.  Recommended I&D.  Patient refused stating, "I just cannot do that right now.  I cannot put up with that kind of pain." It was explained to the patient in direct terms that I&D is the definitive management for abscess.  She acknowledged this information and continued to refuse I&D. We will try a course of antibiotics, however, we still recommend I&D in this patient.  Should patient change her mind and wish to return, she is welcome and encouraged to do so.  The patient was given instructions for additional home care as well as return precautions. Patient voices understanding of these instructions, accepts the plan, and is comfortable with discharge.   Final Clinical Impressions(s) / ED Diagnoses   Final diagnoses:  Abscess    ED Discharge Orders        Ordered    clindamycin (CLEOCIN) 150 MG capsule  3 times daily     02/22/17 1920       Anselm PancoastJoy, Ha Placeres C, PA-C 02/23/17 0125    Abelino DerrickMackuen, Courteney Lyn, MD 02/23/17 1616

## 2017-02-22 NOTE — ED Notes (Signed)
ED Provider at bedside. 

## 2017-07-04 ENCOUNTER — Other Ambulatory Visit: Payer: Self-pay

## 2017-07-04 ENCOUNTER — Encounter (HOSPITAL_BASED_OUTPATIENT_CLINIC_OR_DEPARTMENT_OTHER): Payer: Self-pay | Admitting: Adult Health

## 2017-07-04 DIAGNOSIS — R109 Unspecified abdominal pain: Secondary | ICD-10-CM | POA: Insufficient documentation

## 2017-07-04 DIAGNOSIS — M5441 Lumbago with sciatica, right side: Secondary | ICD-10-CM | POA: Insufficient documentation

## 2017-07-04 DIAGNOSIS — F17210 Nicotine dependence, cigarettes, uncomplicated: Secondary | ICD-10-CM | POA: Insufficient documentation

## 2017-07-04 DIAGNOSIS — R112 Nausea with vomiting, unspecified: Secondary | ICD-10-CM | POA: Insufficient documentation

## 2017-07-04 NOTE — ED Triage Notes (Signed)
PResent with mid lumbar back pain that began 2 weeks ago associated with pelvic pain and nausea and vomiting. Denies vaginal d/c and abnormal bleeding. Denies pain with urination. LAst BM today and was diarrhea.

## 2017-07-05 ENCOUNTER — Emergency Department (HOSPITAL_BASED_OUTPATIENT_CLINIC_OR_DEPARTMENT_OTHER)
Admission: EM | Admit: 2017-07-05 | Discharge: 2017-07-05 | Disposition: A | Discharge status: Home / Self Care | Payer: Self-pay | Attending: Emergency Medicine | Admitting: Emergency Medicine

## 2017-07-05 DIAGNOSIS — M5441 Lumbago with sciatica, right side: Secondary | ICD-10-CM

## 2017-07-05 LAB — URINALYSIS, ROUTINE W REFLEX MICROSCOPIC
Bilirubin Urine: NEGATIVE
Glucose, UA: NEGATIVE mg/dL
Hgb urine dipstick: NEGATIVE
Ketones, ur: NEGATIVE mg/dL
Leukocytes, UA: NEGATIVE
NITRITE: NEGATIVE
PROTEIN: NEGATIVE mg/dL
pH: 6 (ref 5.0–8.0)

## 2017-07-05 LAB — PREGNANCY, URINE: PREG TEST UR: NEGATIVE

## 2017-07-05 MED ORDER — KETOROLAC TROMETHAMINE 30 MG/ML IJ SOLN
30.0000 mg | Freq: Once | INTRAMUSCULAR | Status: AC
  Administered 2017-07-05: 30 mg via INTRAMUSCULAR
  Filled 2017-07-05: qty 1

## 2017-07-05 NOTE — ED Provider Notes (Signed)
[facility] Provider Note: Lowella Dell, MD, FACEP  CSN: [id number] MRN: [id number] ARRIVAL: 07/04/17 at 2318 ROOM: MH08/[facility]08   CHIEF COMPLAINT  Back Pain   HISTORY OF PRESENT ILLNESS  07/05/17 3:45 AM Elizabeth Mendoza is a 37 y.o. female with a 2-week history of low back pain.  The low back pain is midline, sharp in nature and radiates around her right thigh and about the L3 dermatome.  She was seen at [facility] for this 2 days ago.  At that time she also complained of right flank pain radiating to her mid back for a week with associated nausea and vomiting.  And discharged on ibuprofen 800 mg as well as trimethoprim/sulfamethoxazole; the antibiotic was prescribed for a chest wall abscess.  Urinalysis and other lab work was unremarkable.    She presents here claiming her back pain continues to be 10 out of 10, not relieved by the ibuprofen.  It is worse with movement and certain positions.  She denies vaginal discharge or bleeding.  She denies dysuria.   Past Medical History:  Diagnosis Date  . Anemia   . Anxiety   . Depression   . Gonorrhea   . Hernia of abdominal wall   . Shortness of breath   . Skin abnormalities    Hiradenitis suppurativa  . Tooth decay   . Ulcer     Past Surgical History:  Procedure Laterality Date  . DILATION AND CURETTAGE OF UTERUS    . INDUCED ABORTION    . LAPAROSCOPY N/A 12/23/2014   Procedure: LAPAROSCOPY OPERATIVE, Right Salpingectomy with Removal Ectopic Pregnancy, Fulguration Left Fallopian Tube, Cystotomy Right Ovarian Cyst;  Surgeon: Lesly Dukes, MD;  Location: [facility];  Service: Gynecology;  Laterality: N/A;  . TUBAL LIGATION      Family History  Problem Relation Age of Onset  . Diabetes Father   . Anesthesia problems Neg Hx     Social History   Tobacco Use  . Smoking status: Current Every Day Smoker    Packs/day: 0.50    Years: 15.00    Pack years: 7.50    Types: Cigarettes  . Smokeless tobacco: Never  Used  Substance Use Topics  . Alcohol use: Yes    Comment: occ  . Drug use: No    Prior to Admission medications   Not on File    Allergies Aspirin   REVIEW OF SYSTEMS  Negative except as noted here or in the History of Present Illness.   PHYSICAL EXAMINATION  Initial Vital Signs Blood pressure 135/85, pulse 87, temperature 98 F (36.7 C), resp. rate 18, height 5\' 4"  (1.626 m), weight 104.3 kg (230 lb), last menstrual period 06/27/2017, SpO2 100 %.  Examination General: Well-developed, well-nourished female in no acute distress; appearance consistent with age of record HENT: normocephalic; atraumatic Eyes: pupils equal, round and reactive to light; extraocular muscles intact Neck: supple Heart: regular rate and rhythm Lungs: clear to auscultation bilaterally Abdomen: soft; nondistended; nontender; no masses or hepatosplenomegaly; bowel sounds present Back: Positive straight leg raise on the left at 45 degrees, positive straight leg raise on the right 30 degrees Extremities: No deformity; full range of motion; pulses normal Neurologic: Sleeping but arousable; motor function intact in all extremities and symmetric; no facial droop Skin: Warm and dry Psychiatric: Patient appears more interested in sleeping but in cooperating with H&P   RESULTS  Summary of this visit's results, reviewed by myself:   EKG Interpretation  Date/Time:  Ventricular Rate:    PR Interval:    QRS Duration:   QT Interval:    QTC Calculation:   R Axis:     Text Interpretation:        Laboratory Studies: Results for orders placed or performed during the hospital encounter of 07/05/17 (from the past 24 hour(s))  Urinalysis, Routine w reflex microscopic     Status: Abnormal   Collection Time: 07/05/17  1:39 AM  Result Value Ref Range   Color, Urine YELLOW YELLOW   APPearance CLEAR CLEAR   Specific Gravity, Urine >1.030 (H) 1.005 - 1.030   pH 6.0 5.0 - 8.0   Glucose, UA NEGATIVE NEGATIVE  mg/dL   Hgb urine dipstick NEGATIVE NEGATIVE   Bilirubin Urine NEGATIVE NEGATIVE   Ketones, ur NEGATIVE NEGATIVE mg/dL   Protein, ur NEGATIVE NEGATIVE mg/dL   Nitrite NEGATIVE NEGATIVE   Leukocytes, UA NEGATIVE NEGATIVE  Pregnancy, urine     Status: None   Collection Time: 07/05/17  1:39 AM  Result Value Ref Range   Preg Test, Ur NEGATIVE NEGATIVE   Imaging Studies: No results found.  ED COURSE and MDM  Nursing notes and initial vitals signs, including pulse oximetry, reviewed.  Vitals:   07/04/17 2326 07/04/17 2327 07/05/17 0313  BP: (!) 139/101  135/85  Pulse: 86  87  Resp: 18  18  Temp: 97.9 F (36.6 C)  98 F (36.7 C)  TempSrc: Oral    SpO2: 100%  100%  Weight:  104.3 kg (230 lb)   Height:  5\' 4"  (1.626 m)    The patient appears in no acute distress and in no significant pain.  She was sleeping when I entered the room and continued to try to fall back to sleep while being examined.  As noted above she has already been treated with ibuprofen for her back pain at [facility] 2 days ago.  I see no reason to prescribe narcotics at this time.  PROCEDURES    ED DIAGNOSES     ICD-10-CM   1. Acute right-sided low back pain with right-sided sciatica M54.41        Maresa Morash, Jonny RuizJohn, MD 07/05/17 [phone]

## 2017-08-19 ENCOUNTER — Emergency Department (HOSPITAL_BASED_OUTPATIENT_CLINIC_OR_DEPARTMENT_OTHER)
Admission: EM | Admit: 2017-08-19 | Discharge: 2017-08-19 | Disposition: A | Discharge status: Home / Self Care | Payer: Self-pay | Attending: Emergency Medicine | Admitting: Emergency Medicine

## 2017-08-19 ENCOUNTER — Other Ambulatory Visit: Payer: Self-pay

## 2017-08-19 ENCOUNTER — Encounter (HOSPITAL_BASED_OUTPATIENT_CLINIC_OR_DEPARTMENT_OTHER): Payer: Self-pay | Admitting: Emergency Medicine

## 2017-08-19 DIAGNOSIS — Z209 Contact with and (suspected) exposure to unspecified communicable disease: Secondary | ICD-10-CM | POA: Insufficient documentation

## 2017-08-19 DIAGNOSIS — R000 Tachycardia, unspecified: Secondary | ICD-10-CM | POA: Insufficient documentation

## 2017-08-19 DIAGNOSIS — F17210 Nicotine dependence, cigarettes, uncomplicated: Secondary | ICD-10-CM | POA: Insufficient documentation

## 2017-08-19 DIAGNOSIS — J020 Streptococcal pharyngitis: Secondary | ICD-10-CM | POA: Insufficient documentation

## 2017-08-19 LAB — RAPID STREP SCREEN (MED CTR MEBANE ONLY): Streptococcus, Group A Screen (Direct): POSITIVE — AB

## 2017-08-19 MED ORDER — DEXAMETHASONE 4 MG PO TABS
10.0000 mg | ORAL_TABLET | Freq: Once | ORAL | Status: AC
  Administered 2017-08-19: 10 mg via ORAL
  Filled 2017-08-19: qty 1

## 2017-08-19 MED ORDER — PENICILLIN G BENZATHINE 1200000 UNIT/2ML IM SUSP
1.2000 10*6.[IU] | Freq: Once | INTRAMUSCULAR | Status: AC
  Administered 2017-08-19: 1.2 10*6.[IU] via INTRAMUSCULAR
  Filled 2017-08-19: qty 2

## 2017-08-19 NOTE — ED Triage Notes (Signed)
Sore throat x 3 days

## 2017-08-19 NOTE — Discharge Instructions (Signed)
On Monday you need to change her toothbrush.  You should not return to work until Monday.  You can continue taking Tylenol, ibuprofen as needed for fever and throat pain.

## 2017-08-19 NOTE — ED Provider Notes (Signed)
[facility] EMERGENCY DEPARTMENT Provider Note   CSN: [id number] Arrival date & time: 08/19/17  [id number]     History   Chief Complaint Chief Complaint  Patient presents with  . Sore Throat    HPI Elizabeth Mendoza is a 37 y.o. female.  The history is provided by the patient.  Sore Throat  This is a new problem. Episode onset: 3 days ago. The problem occurs constantly. The problem has been gradually worsening. Pertinent negatives include no chest pain, no abdominal pain and no shortness of breath. Associated symptoms comments: Fever, painful swallowing, change in voice, mild congestion.  No coughing.  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 The symptoms are aggravated by swallowing. Nothing relieves the symptoms. Treatments tried: otc cold meds. The treatment provided no relief.    Past Medical History:  Diagnosis Date  . Anemia   . Anxiety   . Depression   . Gonorrhea   . Hernia of abdominal wall   . Shortness of breath   . Skin abnormalities    Hiradenitis suppurativa  . Tooth decay   . Ulcer     Patient Active Problem List   Diagnosis Date Noted  . 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 Hidradenitis suppurativa of left axilla 12/24/2014  . Acute blood loss anemia 12/24/2014  . Eczema 12/20/2013    Past Surgical History:  Procedure Laterality Date  . DILATION AND CURETTAGE OF UTERUS    . INDUCED ABORTION    . LAPAROSCOPY N/A 12/23/2014   Procedure: LAPAROSCOPY OPERATIVE, Right Salpingectomy with Removal Ectopic Pregnancy, Fulguration Left Fallopian Tube, Cystotomy Right Ovarian Cyst;  Surgeon: Lesly DukesKelly H Leggett, MD;  Location: [facility];  Service: Gynecology;  Laterality: N/A;  . TUBAL LIGATION       OB History    Gravida  10   Para  5   Term  5   Preterm  0   AB  5   Living  5     SAB  1   TAB  3   Ectopic  1   Multiple  0   Live Births  5            Home Medications    Prior to  Admission medications   Not on File    Family History Family History  Problem Relation Age of Onset  . Diabetes Father   . Anesthesia problems Neg Hx     Social History Social History   Tobacco Use  . Smoking status: Current Every Day Smoker    Packs/day: 0.50    Years: 15.00    Pack years: 7.50    Types: Cigarettes  . Smokeless tobacco: Never Used  Substance Use Topics  . Alcohol use: Yes    Comment: occ  . Drug use: No     Allergies   Aspirin   Review of Systems Review of Systems  Respiratory: Negative for shortness of breath.   Cardiovascular: Negative for chest pain.  Gastrointestinal: Negative for abdominal pain.  All other systems reviewed and are negative.    Physical Exam Updated Vital Signs BP 117/88 (BP Location: Left Arm)   Pulse (!) 116   Temp 99.6 F (37.6 C) (Oral)   Resp 18   Ht 5\' 4"  (1.626 m)   Wt 104.3 kg (230 lb)   LMP 08/15/2017   SpO2 98%   BMI 39.48 kg/m   Physical Exam  Constitutional: She is  oriented to person, place, and time. She appears well-developed and well-nourished. No distress.  HENT:  Head: Normocephalic and atraumatic.  Right Ear: Tympanic membrane normal.  Left Ear: Tympanic membrane normal.  Mouth/Throat: Mucous membranes are dry. No oral lesions. Posterior oropharyngeal edema and posterior oropharyngeal erythema present. Tonsils are 2+ on the right. Tonsils are 2+ on the left. Tonsillar exudate.  Eyes: Pupils are equal, round, and reactive to light. Conjunctivae and EOM are normal.  Neck: Normal range of motion. Neck supple.  Cardiovascular: Regular rhythm and intact distal pulses. Tachycardia present.  No murmur heard. Pulmonary/Chest: Effort normal and breath sounds normal. No respiratory distress. She has no wheezes. She has no rales.  Abdominal: Soft. She exhibits no distension. There is no tenderness. There is no rebound and no guarding.  Musculoskeletal: Normal range of motion. She exhibits no edema or  tenderness.  Lymphadenopathy:    She has cervical adenopathy.  Neurological: She is alert and oriented to person, place, and time.  Skin: Skin is warm and dry. No rash noted. No erythema.  Psychiatric: She has a normal mood and affect. Her behavior is normal.  Nursing note and vitals reviewed.    ED Treatments / Results  Labs (all labs ordered are listed, but only abnormal results are displayed) Labs Reviewed  RAPID STREP SCREEN ([facility] ONLY) - Abnormal; Notable for the following components:      Result Value   Streptococcus, Group A Screen (Direct) POSITIVE (*)    All other components within normal limits    EKG None  Radiology No results found.  Procedures Procedures (including critical care time)  Medications Ordered in ED Medications  penicillin g benzathine (BICILLIN LA) 1200000 UNIT/2ML injection 1.2 Million Units (has no administration in time range)  dexamethasone (DECADRON) tablet 10 mg (has no administration in time range)     Initial Impression / Assessment and Plan / ED Course  I have reviewed the triage vital signs and the nursing notes.  Pertinent labs & imaging results that were available during my care of the patient were reviewed by me and considered in my medical decision making (see chart for details).     Relatively healthy 37 year old female presenting today with 3 days of fever, sore throat with evidence of tonsillar exudates, erythema and swelling most consistent with strep throat.  She has 4 Centor criteria and findings most suggestive of strep pharyngitis.  9:50 AM Strep positive and pt treated with bicillin and decadron.  Final Clinical Impressions(s) / ED Diagnoses   Final diagnoses:  Strep pharyngitis    ED Discharge Orders    None       Gwyneth Sprout, MD 08/19/17 [id number]

## 2017-08-27 ENCOUNTER — Emergency Department (HOSPITAL_BASED_OUTPATIENT_CLINIC_OR_DEPARTMENT_OTHER)
Admission: EM | Admit: 2017-08-27 | Discharge: 2017-08-27 | Disposition: A | Discharge status: Home / Self Care | Payer: Medicaid Other | Attending: Emergency Medicine | Admitting: Emergency Medicine

## 2017-08-27 ENCOUNTER — Encounter (HOSPITAL_BASED_OUTPATIENT_CLINIC_OR_DEPARTMENT_OTHER): Payer: Self-pay | Admitting: Emergency Medicine

## 2017-08-27 ENCOUNTER — Other Ambulatory Visit: Payer: Self-pay

## 2017-08-27 DIAGNOSIS — R350 Frequency of micturition: Secondary | ICD-10-CM | POA: Insufficient documentation

## 2017-08-27 DIAGNOSIS — R1031 Right lower quadrant pain: Secondary | ICD-10-CM | POA: Insufficient documentation

## 2017-08-27 DIAGNOSIS — R110 Nausea: Secondary | ICD-10-CM | POA: Insufficient documentation

## 2017-08-27 LAB — URINALYSIS, ROUTINE W REFLEX MICROSCOPIC
Bilirubin Urine: NEGATIVE
Glucose, UA: NEGATIVE mg/dL
Hgb urine dipstick: NEGATIVE
Ketones, ur: NEGATIVE mg/dL
LEUKOCYTES UA: NEGATIVE
Nitrite: NEGATIVE
PH: 6.5 (ref 5.0–8.0)
Protein, ur: NEGATIVE mg/dL
SPECIFIC GRAVITY, URINE: 1.01 (ref 1.005–1.030)

## 2017-08-27 LAB — PREGNANCY, URINE: Preg Test, Ur: NEGATIVE

## 2017-08-27 LAB — CBC WITH DIFFERENTIAL/PLATELET
BASOS ABS: 0 10*3/uL (ref 0.0–0.1)
Basophils Relative: 0 %
EOS PCT: 1 %
Eosinophils Absolute: 0.1 10*3/uL (ref 0.0–0.7)
HEMATOCRIT: 30 % — AB (ref 36.0–46.0)
Hemoglobin: 10 g/dL — ABNORMAL LOW (ref 12.0–15.0)
LYMPHS ABS: 2.5 10*3/uL (ref 0.7–4.0)
LYMPHS PCT: 31 %
MCH: 28.9 pg (ref 26.0–34.0)
MCHC: 33.3 g/dL (ref 30.0–36.0)
MCV: 86.7 fL (ref 78.0–100.0)
MONOS PCT: 6 %
Monocytes Absolute: 0.5 10*3/uL (ref 0.1–1.0)
NEUTROS PCT: 62 %
Neutro Abs: 5 10*3/uL (ref 1.7–7.7)
Platelets: 353 10*3/uL (ref 150–400)
RBC: 3.46 MIL/uL — AB (ref 3.87–5.11)
RDW: 15 % (ref 11.5–15.5)
WBC: 8.1 10*3/uL (ref 4.0–10.5)

## 2017-08-27 LAB — COMPREHENSIVE METABOLIC PANEL
ALT: 14 U/L (ref 0–44)
AST: 14 U/L — AB (ref 15–41)
Albumin: 3.3 g/dL — ABNORMAL LOW (ref 3.5–5.0)
Alkaline Phosphatase: 54 U/L (ref 38–126)
Anion gap: 9 (ref 5–15)
BILIRUBIN TOTAL: 0.3 mg/dL (ref 0.3–1.2)
BUN: 14 mg/dL (ref 6–20)
CHLORIDE: 105 mmol/L (ref 98–111)
CO2: 26 mmol/L (ref 22–32)
CREATININE: 0.65 mg/dL (ref 0.44–1.00)
Calcium: 8.7 mg/dL — ABNORMAL LOW (ref 8.9–10.3)
GFR calc Af Amer: >60 mL/min (ref >60)
Glucose, Bld: 97 mg/dL (ref 70–99)
POTASSIUM: 3.9 mmol/L (ref 3.5–5.1)
Sodium: 140 mmol/L (ref 135–145)
TOTAL PROTEIN: 7.3 g/dL (ref 6.5–8.1)

## 2017-08-27 MED ORDER — ONDANSETRON HCL 4 MG PO TABS: 4.0000 mg | ORAL_TABLET | Freq: Three times a day (TID) | ORAL | 0 refills | Status: DC | PRN

## 2017-08-27 NOTE — ED Provider Notes (Signed)
[facility] EMERGENCY DEPARTMENT Provider Note   CSN: [id number] Arrival date & time: 08/27/17  0756     History   Chief Complaint Chief Complaint  Patient presents with  . Pelvic Pain  . Urinary Frequency    HPI Elizabeth Mendoza is a 37 y.o. female.  The history is provided by the patient and medical records. No language interpreter was used.  Urinary Frequency  This is a new problem. The current episode started more than 2 days ago. The problem occurs constantly. The problem has not changed since onset.Associated symptoms include abdominal pain. Pertinent negatives include no chest pain, no headaches and no shortness of breath. Nothing aggravates the symptoms. Nothing relieves the symptoms. She has tried nothing for the symptoms. The treatment provided no relief.    Past Medical History:  Diagnosis Date  . Anemia   . Anxiety   . Depression   . Gonorrhea   . Hernia of abdominal wall   . Shortness of breath   . Skin abnormalities    Hiradenitis suppurativa  . Tooth decay   . Ulcer     Patient Active Problem List   Diagnosis Date Noted  . 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 Hidradenitis suppurativa of left axilla 12/24/2014  . Acute blood loss anemia 12/24/2014  . Eczema 12/20/2013    Past Surgical History:  Procedure Laterality Date  . DILATION AND CURETTAGE OF UTERUS    . INDUCED ABORTION    . LAPAROSCOPY N/A 12/23/2014   Procedure: LAPAROSCOPY OPERATIVE, Right Salpingectomy with Removal Ectopic Pregnancy, Fulguration Left Fallopian Tube, Cystotomy Right Ovarian Cyst;  Surgeon: Lesly DukesKelly H Leggett, MD;  Location: [facility];  Service: Gynecology;  Laterality: N/A;  . TUBAL LIGATION       OB History    Gravida  10   Para  5   Term  5   Preterm  0   AB  5   Living  5     SAB  1   TAB  3   Ectopic  1   Multiple  0   Live Births  5            Home Medications    Prior to Admission medications     Not on File    Family History Family History  Problem Relation Age of Onset  . Diabetes Father   . Anesthesia problems Neg Hx     Social History Social History   Tobacco Use  . Smoking status: Current Every Day Smoker    Packs/day: 0.50    Years: 15.00    Pack years: 7.50    Types: Cigarettes  . Smokeless tobacco: Never Used  Substance Use Topics  . Alcohol use: Yes    Comment: occ  . Drug use: No     Allergies   Aspirin   Review of Systems Review of Systems  Constitutional: Negative for chills, diaphoresis, fatigue and fever.  HENT: Negative for congestion.   Respiratory: Negative for chest tightness, shortness of breath and wheezing.   Cardiovascular: Negative for chest pain, palpitations and leg swelling.  Gastrointestinal: Positive for abdominal pain, constipation and nausea. Negative for diarrhea and vomiting.  Genitourinary: Positive for frequency. Negative for difficulty urinating and dysuria.  Musculoskeletal: Negative for back pain, neck pain and neck stiffness.  Skin: Negative for rash and wound.  Neurological: Negative for light-headedness and headaches.  Psychiatric/Behavioral: Negative for agitation and confusion.  Physical Exam Updated Vital Signs BP (!) 132/94 (BP Location: Left Arm)   Pulse 84   Temp 98.6 F (37 C) (Oral)   Resp 18   Ht 5\' 4"  (1.626 m)   Wt 104.3 kg (230 lb)   LMP 08/15/2017   SpO2 98%   BMI 39.48 kg/m   Physical Exam  Constitutional: She appears well-developed and well-nourished. No distress.  HENT:  Head: Normocephalic and atraumatic.  Mouth/Throat: No oropharyngeal exudate.  Eyes: Conjunctivae are normal.  Neck: Neck supple.  Cardiovascular: Normal rate and regular rhythm.  No murmur heard. Pulmonary/Chest: Effort normal and breath sounds normal. No respiratory distress. She has no wheezes. She has no rales. She exhibits no tenderness.  Abdominal: Soft. She exhibits no distension. There is no tenderness.  There is no rebound.  Musculoskeletal: She exhibits no edema or tenderness.  Neurological: She is alert. No sensory deficit. She exhibits normal muscle tone.  Skin: Skin is warm and dry. Capillary refill takes less than 2 seconds. No rash noted. She is not diaphoretic. No erythema.  Psychiatric: She has a normal mood and affect.  Nursing note and vitals reviewed.    ED Treatments / Results  Labs (all labs ordered are listed, but only abnormal results are displayed) Labs Reviewed  CBC WITH DIFFERENTIAL/PLATELET - Abnormal; Notable for the following components:      Result Value   RBC 3.46 (*)    Hemoglobin 10.0 (*)    HCT 30.0 (*)    All other components within normal limits  COMPREHENSIVE METABOLIC PANEL - Abnormal; Notable for the following components:   Calcium 8.7 (*)    Albumin 3.3 (*)    AST 14 (*)    All other components within normal limits  URINE CULTURE  URINALYSIS, ROUTINE W REFLEX MICROSCOPIC  PREGNANCY, URINE    EKG None  Radiology No results found.  Procedures Procedures (including critical care time)  Medications Ordered in ED Medications - No data to display   Initial Impression / Assessment and Plan / ED Course  I have reviewed the triage vital signs and the nursing notes.  Pertinent labs & imaging results that were available during my care of the patient were reviewed by me and considered in my medical decision making (see chart for details).     Elizabeth Mendoza is a 37 y.o. female with a past medical history significant for ectopic pregnancy status post tubal ligation, eczema, and anemia who presents with urinary frequency, suprapubic abdominal pain, and right flank pain.  Patient reports that her symptoms been ongoing for the last few days.  She reports that she has been urinating frequently but is not having dysuria.  She reports a history of UTIs in the past.  She thinks this feels similar.  She reports some moderate to severe lower abdominal pain  that is dull and aching.  She reports it radiates towards her right flank.  She reports she is currently having minimal pain.  She denies any trauma.  She denies fevers, chills, emesis, or diarrhea.  She reports chronic constipation and reports a.m. nausea for the last week.  She denies any upper abdominal pain, cough, congestion, or URI symptoms.  She denies any vaginal bleeding, vaginal discharge, and has no vaginal pain.  She does not want a pelvic exam on initial history.  On exam, patient had no abdominal tenderness, right lower quadrant tenderness, or flank tenderness.  She just had a dull ache.  Patient had no tenderness in her  legs with no swelling and had normal sensation in the legs.  Back was nontender, CVA areas nontender.  Lungs clear and chest nontender.  Normal bowel sounds appreciated.  Clinical I suspect patient has UTI with pain rating towards her right flank.  She denies history of kidney stone and, have lower suspicion for kidney stone.  Given her lack of tenderness in the right lower quadrant, lower suspicion for appendicitis however if laboratory testing is concerning, would consider CT imaging.  Patient will have urinalysis  and labs initially.  Patient's urinalysis did not show infection.  Hemoglobin slightly decreased from prior.  No leukocytosis.  Metabolic panel reassuring.  Parents test negative.    Due to the ambiguous abdominal pain with reassuring labs, patient was offered CT scan to rule out diverticulitis, appendicitis, or kidney stone.  As patient symptoms have improved, she would rather be discharged home.  Patient will follow-up with her PCP in several days and understood return precautions for new or worsening symptoms.  Patient informed if she returns, she would likely need CT imaging.  Next  Patient was understand the plan of care and had other questions or concerns.  Patient discharged in good condition with improved symptoms.   Final Clinical Impressions(s) /  ED Diagnoses   Final diagnoses:  Right lower quadrant abdominal pain  Urinary frequency  Nausea    ED Discharge Orders        Ordered    ondansetron (ZOFRAN) 4 MG tablet  Every 8 hours PRN     08/27/17 1049      Clinical Impression: 1. Right lower quadrant abdominal pain   2. Urinary frequency   3. Nausea     Disposition: Discharge  Condition: Good  I have discussed the results, Dx and Tx plan with the pt(& family if present). He/she/they expressed understanding and agree(s) with the plan. Discharge instructions discussed at great length. Strict return precautions discussed and pt &/or family have verbalized understanding of the instructions. No further questions at time of discharge.    Discharge Medication List as of 08/27/2017 10:51 AM    START taking these medications   Details  ondansetron (ZOFRAN) 4 MG tablet Take 1 tablet (4 mg total) by mouth every 8 (eight) hours as needed., Starting Sun 08/27/2017, Print        Follow Up: [facility] [address] [place] [address] [phone] Schedule an appointment as soon as possible for a visit    [facility] [place] [facility] [address] [phone] [address] [place] [address] [phone]       Loran Fleet, Canary Brim, MD 08/27/17 1530

## 2017-08-27 NOTE — Discharge Instructions (Signed)
Your urinalysis today did not show evidence of acute infection.  We have a lower suspicion for kidney stone given the lack of blood in your urine.  Your abdominal pain is improved without significant medications.  Your laboratory testing was overall reassuring.  We discussed the possibility of needing to do CT imaging to rule out appendicitis or diverticulitis as well as pelvic exam for pelvic pathology however since your symptoms improved, you elected to go home.  Please follow-up with your primary doctor.  If any symptoms change or worsen, please return to the nearest emergency department for likely imaging and/or pelvic exam.

## 2017-08-27 NOTE — ED Triage Notes (Addendum)
Patient states that she was here last week and treated for strep throat - patient states that she now has lower pelvic pain and right sided flank pain intermittently - patient denies any pain or symptoms right now  - patient also states that she has intermittent nausea - patient also reports that she is going to the bathroom all the time

## 2017-08-28 LAB — URINE CULTURE: Culture: 40000 — AB

## 2017-08-29 ENCOUNTER — Telehealth: Payer: Self-pay | Admitting: Emergency Medicine

## 2017-08-29 NOTE — Telephone Encounter (Signed)
Post ED Visit - Positive Culture Follow-up  Culture report reviewed by antimicrobial stewardship pharmacist:  []  Enzo BiNathan Batchelder, Pharm.D. []  Celedonio MiyamotoJeremy Frens, Pharm.D., BCPS AQ-ID []  Garvin FilaMike Maccia, Pharm.D., BCPS []  Georgina PillionElizabeth Martin, [address] BCPS []  WintonMinh Pham, [address]Ph[address]., BCPS, AAHIVP []  Estella HuskMichelle Turner, Pharm.D., BCPS, AAHIVP [x]  Lysle Pearlachel Rumbarger, PharmD, BCPS []  Phillips Climeshuy Dang, PharmD, BCPS []  Agapito GamesAlison Masters, PharmD, BCPS []  Verlan FriendsErin Deja, PharmD  Positive urine culture Treated with none, asymptomatic, contaminant,  no further patient follow-up is required at this time.  Berle MullMiller, Rainee Sweatt 08/29/2017, 11:20 AM

## 2017-09-30 ENCOUNTER — Emergency Department (HOSPITAL_BASED_OUTPATIENT_CLINIC_OR_DEPARTMENT_OTHER): Payer: Self-pay

## 2017-09-30 ENCOUNTER — Emergency Department (HOSPITAL_BASED_OUTPATIENT_CLINIC_OR_DEPARTMENT_OTHER)
Admission: EM | Admit: 2017-09-30 | Discharge: 2017-09-30 | Disposition: A | Discharge status: Home / Self Care | Payer: Self-pay | Attending: Emergency Medicine | Admitting: Emergency Medicine

## 2017-09-30 ENCOUNTER — Other Ambulatory Visit: Payer: Self-pay

## 2017-09-30 ENCOUNTER — Encounter (HOSPITAL_BASED_OUTPATIENT_CLINIC_OR_DEPARTMENT_OTHER): Payer: Self-pay | Admitting: *Deleted

## 2017-09-30 DIAGNOSIS — R51 Headache: Secondary | ICD-10-CM | POA: Insufficient documentation

## 2017-09-30 DIAGNOSIS — R079 Chest pain, unspecified: Secondary | ICD-10-CM | POA: Insufficient documentation

## 2017-09-30 DIAGNOSIS — F17210 Nicotine dependence, cigarettes, uncomplicated: Secondary | ICD-10-CM | POA: Insufficient documentation

## 2017-09-30 LAB — CBC
HEMATOCRIT: 32.9 % — AB (ref 36.0–46.0)
Hemoglobin: 10.9 g/dL — ABNORMAL LOW (ref 12.0–15.0)
MCH: 28.8 pg (ref 26.0–34.0)
MCHC: 33.1 g/dL (ref 30.0–36.0)
MCV: 86.8 fL (ref 78.0–100.0)
Platelets: 299 10*3/uL (ref 150–400)
RBC: 3.79 MIL/uL — ABNORMAL LOW (ref 3.87–5.11)
RDW: 15.7 % — AB (ref 11.5–15.5)
WBC: 8.7 10*3/uL (ref 4.0–10.5)

## 2017-09-30 LAB — BASIC METABOLIC PANEL
ANION GAP: 10 (ref 5–15)
BUN: 17 mg/dL (ref 6–20)
CALCIUM: 9 mg/dL (ref 8.9–10.3)
CO2: 23 mmol/L (ref 22–32)
Chloride: 106 mmol/L (ref 98–111)
Creatinine, Ser: 0.69 mg/dL (ref 0.44–1.00)
GFR calc Af Amer: >60 mL/min (ref >60)
GFR calc non Af Amer: >60 mL/min (ref >60)
GLUCOSE: 100 mg/dL — AB (ref 70–99)
Potassium: 3.5 mmol/L (ref 3.5–5.1)
Sodium: 139 mmol/L (ref 135–145)

## 2017-09-30 LAB — TROPONIN I: Troponin I: <0.03 ng/mL (ref <0.03)

## 2017-09-30 LAB — PREGNANCY, URINE: Preg Test, Ur: NEGATIVE

## 2017-09-30 NOTE — ED Notes (Signed)
wtg on [id number]

## 2017-09-30 NOTE — Discharge Instructions (Signed)
Your blood work, EKG and chest x-ray were reassuring.  Return to the emergency department if you have any new or concerning symptoms like trouble breathing, chest pain with sweating, chest pain with feeling as if you are going to pass out, chest pain with vomiting or nausea, chest pain that radiates to your left arm or jaw.

## 2017-09-30 NOTE — ED Triage Notes (Signed)
Pt states she has been having recurrent chest pain over the last week. States when pain gets bad her head starts hurting

## 2017-09-30 NOTE — ED Provider Notes (Signed)
[facility] EMERGENCY DEPARTMENT Provider Note   CSN: [id number] Arrival date & time: 09/30/17  1807     History   Chief Complaint Chief Complaint  Patient presents with  . Headache  . Chest Pain    HPI Elizabeth Mendoza is a 37 y.o. female.  HPI   Elizabeth Mendoza is a 37 year old female with a history of anxiety and depression who presents to the emergency department for evaluation of intermittent chest pressure and headaches.  Patient reports that her symptoms have been ongoing for about a week now.  She reports substernal chest pressure was radiates to her head intermittently.  These episodes generally occur when she feels stressed. She believes that her blood pressure may be elevated, but has not checked it.  She states that she currently has mild chest pressure, no headache.  She denies associated shortness of breath, lightheadedness, diaphoresis, nausea/vomiting.  She smokes cigarettes.  No family history of ACS that she is aware of.  No history of exertional chest pain.  She denies history of DVT/PE, exogenous estrogen, hemoptysis, unilateral leg swelling or calf tenderness, active cancer, recent surgery or immobilization.  She denies fevers, chills, cough, congestion, sore throat, abdominal pain.  She does endorse increased urinary frequency, although states her urine has been tested and does not show infection.  Denies dysuria, flank pain or hematuria.  Past Medical History:  Diagnosis Date  . Anemia   . Anxiety   . Depression   . Gonorrhea   . Hernia of abdominal wall   . Shortness of breath   . Skin abnormalities    Hiradenitis suppurativa  . Tooth decay   . Ulcer     Patient Active Problem List   Diagnosis Date Noted  . 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 Hidradenitis suppurativa of left axilla 12/24/2014  . Acute blood loss anemia 12/24/2014  . Eczema 12/20/2013    Past Surgical History:  Procedure Laterality Date    . DILATION AND CURETTAGE OF UTERUS    . INDUCED ABORTION    . LAPAROSCOPY N/A 12/23/2014   Procedure: LAPAROSCOPY OPERATIVE, Right Salpingectomy with Removal Ectopic Pregnancy, Fulguration Left Fallopian Tube, Cystotomy Right Ovarian Cyst;  Surgeon: Lesly Dukes, MD;  Location: [facility];  Service: Gynecology;  Laterality: N/A;  . TUBAL LIGATION       OB History    Gravida  10   Para  5   Term  5   Preterm  0   AB  5   Living  5     SAB  1   TAB  3   Ectopic  1   Multiple  0   Live Births  5            Home Medications    Prior to Admission medications   Medication Sig Start Date End Date Taking? Authorizing Provider  ondansetron (ZOFRAN) 4 MG tablet Take 1 tablet (4 mg total) by mouth every 8 (eight) hours as needed. 08/27/17   Tegeler, Canary Brim, MD    Family History Family History  Problem Relation Age of Onset  . Diabetes Father   . Anesthesia problems Neg Hx     Social History Social History   Tobacco Use  . Smoking status: Current Every Day Smoker    Packs/day: 0.50    Years: 15.00    Pack years: 7.50    Types: Cigarettes  . Smokeless tobacco: Never Used  Substance Use  Topics  . Alcohol use: Not Currently    Comment: occ  . Drug use: No     Allergies   Aspirin   Review of Systems Review of Systems  Constitutional: Negative for chills and fever.  Eyes: Negative for visual disturbance.  Respiratory: Negative for shortness of breath.   Cardiovascular: Positive for chest pain. Negative for palpitations and leg swelling.  Gastrointestinal: Negative for abdominal pain, nausea and vomiting.  Genitourinary: Positive for frequency. Negative for difficulty urinating, dysuria, flank pain and hematuria.  Musculoskeletal: Negative for back pain and gait problem.  Skin: Negative for rash.  Neurological: Negative for syncope, light-headedness and headaches.  Psychiatric/Behavioral: Negative for agitation.     Physical Exam Updated  Vital Signs BP 128/87   Pulse 83   Temp 97.9 F (36.6 C) (Oral)   Resp 18   Ht 5\' 4"  (1.626 m)   Wt 101.3 kg   LMP 09/16/2017   SpO2 98%   BMI 38.33 kg/m   Physical Exam  Constitutional: She is oriented to person, place, and time. She appears well-developed and well-nourished. No distress.  No acute distress, nontoxic-appearing.  No diaphoresis.  HENT:  Head: Normocephalic and atraumatic.  Mouth/Throat: Oropharynx is clear and moist.  Eyes: Pupils are equal, round, and reactive to light. Conjunctivae are normal. Right eye exhibits no discharge. Left eye exhibits no discharge.  Neck: Normal range of motion. Neck supple. No JVD present. No tracheal deviation present.  Cardiovascular: Normal rate, regular rhythm and intact distal pulses.  No murmur heard. Pulmonary/Chest: Effort normal and breath sounds normal. No stridor. No respiratory distress. She has no wheezes. She has no rales.  Abdominal: Soft. Bowel sounds are normal. There is no tenderness.  Musculoskeletal:  No leg swelling or calf tenderness.  Neurological: She is alert and oriented to person, place, and time. Coordination normal.  Skin: Skin is warm and dry. Capillary refill takes less than 2 seconds. She is not diaphoretic.  Psychiatric: She has a normal mood and affect. Her behavior is normal.  Nursing note and vitals reviewed.    ED Treatments / Results  Labs (all labs ordered are listed, but only abnormal results are displayed) Labs Reviewed  CBC - Abnormal; Notable for the following components:      Result Value   RBC 3.79 (*)    Hemoglobin 10.9 (*)    HCT 32.9 (*)    RDW 15.7 (*)    All other components within normal limits  BASIC METABOLIC PANEL - Abnormal; Notable for the following components:   Glucose, Bld 100 (*)    All other components within normal limits  PREGNANCY, URINE  TROPONIN I    EKG EKG Interpretation  Date/Time:  Saturday September 30 2017 18:32:04 EDT Ventricular Rate:  82 PR  Interval:  184 QRS Duration: 88 QT Interval:  384 QTC Calculation: 448 R Axis:   70 Text Interpretation:  Normal sinus rhythm Normal ECG No significant change since last tracing Confirmed by Shaune Pollack [phone]) on 09/30/2017 11:17:41 PM   Radiology Dg Chest 2 View  Result Date: 09/30/2017 CLINICAL DATA:  Patient with headache and chest pain. EXAM: CHEST - 2 VIEW COMPARISON:  Chest radiograph 06/24/2016 FINDINGS: Monitoring leads overlie the patient. Stable cardiac and mediastinal contours. No consolidative pulmonary opacities. No pleural effusion or pneumothorax. Regional skeleton unremarkable. IMPRESSION: No acute cardiopulmonary process. Electronically Signed   By: Annia Belt M.D.   On: 09/30/2017 22:57    Procedures Procedures (including critical care time)  Medications Ordered in ED Medications - No data to display   Initial Impression / Assessment and Plan / ED Course  I have reviewed the triage vital signs and the nursing notes.  Pertinent labs & imaging results that were available during my care of the patient were reviewed by me and considered in my medical decision making (see chart for details).    Patient's symptoms likely stress related given they are worsened when she is feeling overwhelmed at work.  She is PERC negative, doubt PE based on symptoms.  Troponin negative and EKG nonischemic, I do not suspect ACS.  Chest x-ray without pneumonia, pneumothorax or pneumomediastinum.  She is afebrile and nontoxic without leukocytosis and I do not suspect esophageal perforation.  No recent illness, positional changes to suggest or diffuse ST elevation to suggest pericarditis.  Patient will be discharged with instructions to establish care with the PCP for follow-up.  I have counseled her on return precautions and she agrees and appears reliable.  Final Clinical Impressions(s) / ED Diagnoses   Final diagnoses:  Nonspecific chest pain    ED Discharge Orders    None         Lawrence MarseillesShrosbree, Sulamita Lafountain J, PA-C 09/30/17 2319    Shaune PollackIsaacs, Cameron, MD 10/01/17 1158

## 2017-11-10 ENCOUNTER — Encounter (HOSPITAL_BASED_OUTPATIENT_CLINIC_OR_DEPARTMENT_OTHER): Payer: Self-pay | Admitting: Emergency Medicine

## 2017-11-10 ENCOUNTER — Other Ambulatory Visit: Payer: Self-pay

## 2017-11-10 ENCOUNTER — Emergency Department (HOSPITAL_BASED_OUTPATIENT_CLINIC_OR_DEPARTMENT_OTHER)
Admission: EM | Admit: 2017-11-10 | Discharge: 2017-11-10 | Disposition: A | Discharge status: Home / Self Care | Payer: Self-pay | Attending: Emergency Medicine | Admitting: Emergency Medicine

## 2017-11-10 DIAGNOSIS — L02412 Cutaneous abscess of left axilla: Secondary | ICD-10-CM | POA: Insufficient documentation

## 2017-11-10 DIAGNOSIS — F17210 Nicotine dependence, cigarettes, uncomplicated: Secondary | ICD-10-CM | POA: Insufficient documentation

## 2017-11-10 DIAGNOSIS — L02411 Cutaneous abscess of right axilla: Secondary | ICD-10-CM | POA: Insufficient documentation

## 2017-11-10 DIAGNOSIS — L0291 Cutaneous abscess, unspecified: Secondary | ICD-10-CM

## 2017-11-10 MED ORDER — DOXYCYCLINE HYCLATE 100 MG PO CAPS: 100.0000 mg | ORAL_CAPSULE | Freq: Two times a day (BID) | ORAL | 0 refills | Status: AC

## 2017-11-10 NOTE — ED Provider Notes (Signed)
[facility] EMERGENCY DEPARTMENT Provider Note   CSN: [id number] Arrival date & time: 11/10/17  4098     History   Chief Complaint Chief Complaint  Patient presents with  . Abscess    HPI Elizabeth Mendoza is a 37 y.o. female.  HPI  Patient is a 37 year old female with a history of anemia, anxiety/depression, hidradenitis suppurativa, who presents the emergency department today for evaluation of abscesses underneath her bilateral axilla which have been present intermittently for years but seem to have been bothering her for the last month.  She denies any acute worsening of her symptoms recently, but states that she came to the ED because she was concerned about possibility of an infection in her bloodstream.  She denies any fevers or chills.  She has had drainage from the bilateral axilla.  Has had constant pain to the areas.  Denies any exacerbating or alleviating factors.  She has tried no interventions for her symptoms.  She states she has never followed up with general surgery for her symptoms.  Past Medical History:  Diagnosis Date  . Anemia   . Anxiety   . Depression   . Gonorrhea   . Hernia of abdominal wall   . Shortness of breath   . Skin abnormalities    Hiradenitis suppurativa  . Tooth decay   . Ulcer     Patient Active Problem List   Diagnosis Date Noted  . 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 Hidradenitis suppurativa of left axilla 12/24/2014  . Acute blood loss anemia 12/24/2014  . Eczema 12/20/2013    Past Surgical History:  Procedure Laterality Date  . DILATION AND CURETTAGE OF UTERUS    . INDUCED ABORTION    . LAPAROSCOPY N/A 12/23/2014   Procedure: LAPAROSCOPY OPERATIVE, Right Salpingectomy with Removal Ectopic Pregnancy, Fulguration Left Fallopian Tube, Cystotomy Right Ovarian Cyst;  Surgeon: Lesly Dukes, MD;  Location: [facility];  Service: Gynecology;  Laterality: N/A;  . TUBAL LIGATION       OB  History    Gravida  10   Para  5   Term  5   Preterm  0   AB  5   Living  5     SAB  1   TAB  3   Ectopic  1   Multiple  0   Live Births  5            Home Medications    Prior to Admission medications   Medication Sig Start Date End Date Taking? Authorizing Provider  doxycycline (VIBRAMYCIN) 100 MG capsule Take 1 capsule (100 mg total) by mouth 2 (two) times daily for 7 days. 11/10/17 11/17/17  Zakery Normington S, PA-C    Family History Family History  Problem Relation Age of Onset  . Diabetes Father   . Anesthesia problems Neg Hx     Social History Social History   Tobacco Use  . Smoking status: Current Every Day Smoker    Packs/day: 0.50    Years: 15.00    Pack years: 7.50    Types: Cigarettes  . Smokeless tobacco: Never Used  Substance Use Topics  . Alcohol use: Not Currently    Comment: occ  . Drug use: No     Allergies   Aspirin   Review of Systems Review of Systems  Constitutional: Negative for chills and fever.  HENT: Negative for sore throat.   Eyes: Negative for visual disturbance.  Respiratory: Negative for shortness of breath.   Cardiovascular: Negative for chest pain.  Gastrointestinal: Negative for abdominal pain, nausea and vomiting.  Genitourinary: Negative for dysuria.  Musculoskeletal: Negative for myalgias.  Skin:       Abscesses to bilat axilla  Neurological: Negative for headaches.    Physical Exam Updated Vital Signs BP 135/87   Pulse 79   Temp 98.4 F (36.9 C) (Oral)   Ht 5\' 4"  (1.626 m)   Wt 104.3 kg   LMP 10/30/2017   SpO2 99%   BMI 39.48 kg/m   Physical Exam  Constitutional: She appears well-developed and well-nourished. No distress.  Talking on the phone when I enter the room. Nontoxic and in no distress.  HENT:  Head: Normocephalic and atraumatic.  Eyes: Conjunctivae are normal.  Neck: Neck supple.  Cardiovascular: Normal rate, regular rhythm and normal heart sounds.  Pulmonary/Chest:  Effort normal and breath sounds normal. No respiratory distress. She has no wheezes.  Abdominal: Soft. There is no tenderness.  Neurological: She is alert.  Skin: Skin is warm and dry. Capillary refill takes less than 2 seconds.  Multiple areas of fluctuance to the bilat axilla with TTP. No significant erythema or induration. Abscesses are draining to bilat axilla.  Psychiatric: She has a normal mood and affect.  Nursing note and vitals reviewed.    ED Treatments / Results  Labs (all labs ordered are listed, but only abnormal results are displayed) Labs Reviewed - No data to display  EKG None  Radiology No results found.  Procedures Procedures (including critical care time)  Medications Ordered in ED Medications - No data to display   Initial Impression / Assessment and Plan / ED Course  I have reviewed the triage vital signs and the nursing notes.  Pertinent labs & imaging results that were available during my care of the patient were reviewed by me and considered in my medical decision making (see chart for details).     Final Clinical Impressions(s) / ED Diagnoses   Final diagnoses:  Abscess   Pt with h/o hidradenitis suppurativa. Today with multiple small skin abscesses noted to bilat axilla that are draining purulent fluid. There is no significant surrounding erythema or fluctuance. Vital signs WNL. No fevers or signs of systemic infection. Offered to I&D the abscesses and pt declines at this time. She prefers to have an abx Rx at this time. Will give referral to gen surgery and also advised her to f/u with pcp next week. Advised her to return if worse. Pt voices an understanding of the plan and reasons to return. All questions answered. She requested a work note.  ED Discharge Orders         Ordered    doxycycline (VIBRAMYCIN) 100 MG capsule  2 times daily     11/10/17 [address] Pardeep Pautz S, PA-C 11/10/17 1023    Geoffery Lyons, MD 11/10/17  1430

## 2017-11-10 NOTE — ED Triage Notes (Signed)
Pt has abscess under both arms for one month.  No known fever.  Some drainage on the right.

## 2017-11-10 NOTE — Discharge Instructions (Addendum)
You were given a prescription for antibiotics. Please take the antibiotic prescription fully.   Please follow up with your primary care provider within 5-7 days for re-evaluation of your symptoms. If you do not have a primary care provider, information for a healthcare clinic has been provided for you to make arrangements for follow up care.   You were also given a referral to general surgery for follow up. You need to call the office to make an appointment.   Please return to the emergency room immediately if you experience any new or worsening symptoms or any symptoms that indicate worsening infection such as fevers, increased redness/swelling/pain, warmth, or drainage from the affected area.

## 2018-02-12 ENCOUNTER — Emergency Department (HOSPITAL_BASED_OUTPATIENT_CLINIC_OR_DEPARTMENT_OTHER): Payer: Self-pay

## 2018-02-12 ENCOUNTER — Encounter (HOSPITAL_BASED_OUTPATIENT_CLINIC_OR_DEPARTMENT_OTHER): Payer: Self-pay | Admitting: *Deleted

## 2018-02-12 ENCOUNTER — Emergency Department (HOSPITAL_BASED_OUTPATIENT_CLINIC_OR_DEPARTMENT_OTHER)
Admission: EM | Admit: 2018-02-12 | Discharge: 2018-02-13 | Disposition: A | Discharge status: Home / Self Care | Payer: Self-pay | Attending: Emergency Medicine | Admitting: Emergency Medicine

## 2018-02-12 ENCOUNTER — Other Ambulatory Visit: Payer: Self-pay

## 2018-02-12 DIAGNOSIS — F17210 Nicotine dependence, cigarettes, uncomplicated: Secondary | ICD-10-CM | POA: Insufficient documentation

## 2018-02-12 DIAGNOSIS — R1084 Generalized abdominal pain: Secondary | ICD-10-CM | POA: Insufficient documentation

## 2018-02-12 LAB — COMPREHENSIVE METABOLIC PANEL
ALBUMIN: 3.9 g/dL (ref 3.5–5.0)
ALK PHOS: 60 U/L (ref 38–126)
ALT: 11 U/L (ref 0–44)
ANION GAP: 6 (ref 5–15)
AST: 14 U/L — ABNORMAL LOW (ref 15–41)
BILIRUBIN TOTAL: 0.3 mg/dL (ref 0.3–1.2)
BUN: 17 mg/dL (ref 6–20)
CALCIUM: 9.1 mg/dL (ref 8.9–10.3)
CO2: 22 mmol/L (ref 22–32)
CREATININE: 0.66 mg/dL (ref 0.44–1.00)
Chloride: 107 mmol/L (ref 98–111)
GFR calc Af Amer: >60 mL/min (ref >60)
GFR calc non Af Amer: >60 mL/min (ref >60)
GLUCOSE: 108 mg/dL — AB (ref 70–99)
Potassium: 3.3 mmol/L — ABNORMAL LOW (ref 3.5–5.1)
Sodium: 135 mmol/L (ref 135–145)
TOTAL PROTEIN: 7.5 g/dL (ref 6.5–8.1)

## 2018-02-12 LAB — URINALYSIS, ROUTINE W REFLEX MICROSCOPIC
Bilirubin Urine: NEGATIVE
GLUCOSE, UA: NEGATIVE mg/dL
Hgb urine dipstick: NEGATIVE
KETONES UR: NEGATIVE mg/dL
Leukocytes, UA: NEGATIVE
Nitrite: NEGATIVE
PH: 6 (ref 5.0–8.0)
PROTEIN: NEGATIVE mg/dL
Specific Gravity, Urine: 1.025 (ref 1.005–1.030)

## 2018-02-12 LAB — CBC
HCT: 32.6 % — ABNORMAL LOW (ref 36.0–46.0)
Hemoglobin: 10.3 g/dL — ABNORMAL LOW (ref 12.0–15.0)
MCH: 28.5 pg (ref 26.0–34.0)
MCHC: 31.6 g/dL (ref 30.0–36.0)
MCV: 90.1 fL (ref 80.0–100.0)
PLATELETS: 311 10*3/uL (ref 150–400)
RBC: 3.62 MIL/uL — AB (ref 3.87–5.11)
RDW: 15.1 % (ref 11.5–15.5)
WBC: 10.4 10*3/uL (ref 4.0–10.5)
nRBC: 0 % (ref 0.0–0.2)

## 2018-02-12 LAB — LIPASE, BLOOD: Lipase: 26 U/L (ref 11–51)

## 2018-02-12 LAB — PREGNANCY, URINE: Preg Test, Ur: NEGATIVE

## 2018-02-12 NOTE — ED Triage Notes (Signed)
Abdominal pain for a week. Nausea.  

## 2018-02-13 MED ORDER — ONDANSETRON 4 MG PO TBDP: 4.0000 mg | ORAL_TABLET | Freq: Three times a day (TID) | ORAL | 0 refills | Status: DC | PRN

## 2018-02-13 MED ORDER — IOPAMIDOL (ISOVUE-300) INJECTION 61%
100.0000 mL | Freq: Once | INTRAVENOUS | Status: AC | PRN
  Administered 2018-02-13: 100 mL via INTRAVENOUS

## 2018-02-13 NOTE — Discharge Instructions (Addendum)
Your testing today is reassuring.  Take the nausea medication as prescribed and follow-up with your doctor.  Return to the ED with worsening pain especially on the right side of your abdomen, fever, vomiting or other concerns.

## 2018-02-13 NOTE — ED Provider Notes (Signed)
[facility] EMERGENCY DEPARTMENT Provider Note   CSN: [id number] Arrival date & time: 02/12/18  2144     History   Chief Complaint Chief Complaint  Patient presents with  . Abdominal Pain    HPI Elizabeth Mendoza is a 38 y.o. female.  Patient reports 5-day history of mid abdominal pain worse at her umbilicus.  She describes his pain is fairly constant and progressively worsening over the past 4 to 5 days.  Reports episode of diarrhea last week which is since resolved and she is now having normal bowel movement last bowel was today.  Has had nausea but no vomiting.  Good p.o. intake.  Still wants to eat.  No pain with urination or blood in the urine.  She denies any possibility of pregnancy.  She states she is having normal bowel movements now.  She still has an appetite and wants to eat.  The pain has not moved since of her started.  No chest pain or shortness of breath.  The history is provided by the patient.  Abdominal Pain  Associated symptoms: diarrhea and nausea   Associated symptoms: no cough, no dysuria, no fever, no hematuria, no shortness of breath, no vaginal bleeding, no vaginal discharge and no vomiting     Past Medical History:  Diagnosis Date  . Anemia   . Anxiety   . Depression   . Gonorrhea   . Hernia of abdominal wall   . Shortness of breath   . Skin abnormalities    Hiradenitis suppurativa  . Tooth decay   . Ulcer     Patient Active Problem List   Diagnosis Date Noted  . 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 Hidradenitis suppurativa of left axilla 12/24/2014  . Acute blood loss anemia 12/24/2014  . Eczema 12/20/2013    Past Surgical History:  Procedure Laterality Date  . DILATION AND CURETTAGE OF UTERUS    . INDUCED ABORTION    . LAPAROSCOPY N/A 12/23/2014   Procedure: LAPAROSCOPY OPERATIVE, Right Salpingectomy with Removal Ectopic Pregnancy, Fulguration Left Fallopian Tube, Cystotomy Right Ovarian  Cyst;  Surgeon: Lesly Dukes, MD;  Location: [facility];  Service: Gynecology;  Laterality: N/A;  . TUBAL LIGATION       OB History    Gravida  10   Para  5   Term  5   Preterm  0   AB  5   Living  5     SAB  1   TAB  3   Ectopic  1   Multiple  0   Live Births  5            Home Medications    Prior to Admission medications   Not on File    Family History Family History  Problem Relation Age of Onset  . Diabetes Father   . Anesthesia problems Neg Hx     Social History Social History   Tobacco Use  . Smoking status: Current Every Day Smoker    Packs/day: 0.50    Years: 15.00    Pack years: 7.50    Types: Cigarettes  . Smokeless tobacco: Never Used  Substance Use Topics  . Alcohol use: Not Currently    Comment: occ  . Drug use: No     Allergies   Aspirin   Review of Systems Review of Systems  Constitutional: Positive for appetite change. Negative for activity change and fever.  HENT: Negative for  congestion and rhinorrhea.   Eyes: Negative for visual disturbance.  Respiratory: Negative for cough, chest tightness and shortness of breath.   Gastrointestinal: Positive for abdominal pain, diarrhea and nausea. Negative for vomiting.  Genitourinary: Negative for dysuria, hematuria, vaginal bleeding and vaginal discharge.  Musculoskeletal: Negative for arthralgias, back pain and myalgias.  Skin: Negative for rash.  Neurological: Negative for dizziness and weakness.   all other systems are negative except as noted in the HPI and PMH.     Physical Exam Updated Vital Signs BP 120/82 (BP Location: Right Arm)   Pulse (!) 103   Temp 97.8 F (36.6 C) (Oral)   Resp 18   Ht 5\' 4"  (1.626 m)   Wt 102.1 kg   LMP 01/17/2018   SpO2 100%   BMI 38.62 kg/m   Physical Exam Vitals signs and nursing note reviewed.  Constitutional:      General: She is not in acute distress.    Appearance: She is well-developed.  HENT:     Head: Normocephalic  and atraumatic.     Mouth/Throat:     Pharynx: No oropharyngeal exudate.  Eyes:     Conjunctiva/sclera: Conjunctivae normal.     Pupils: Pupils are equal, round, and reactive to light.  Neck:     Musculoskeletal: Normal range of motion and neck supple.     Comments: No meningismus. Cardiovascular:     Rate and Rhythm: Normal rate and regular rhythm.     Heart sounds: Normal heart sounds. No murmur.  Pulmonary:     Effort: Pulmonary effort is normal. No respiratory distress.     Breath sounds: Normal breath sounds.  Abdominal:     Palpations: Abdomen is soft.     Tenderness: There is abdominal tenderness. There is no guarding or rebound.     Comments: Suprapubic abdominal pain and periumbilical pain.  No guarding or rebound. No right lower quadrant tenderness or tenderness McBurney's point.  Musculoskeletal: Normal range of motion.        General: No tenderness.     Comments: No CVA tenderness  Skin:    General: Skin is warm.  Neurological:     Mental Status: She is alert and oriented to person, place, and time.     Cranial Nerves: No cranial nerve deficit.     Motor: No abnormal muscle tone.     Coordination: Coordination normal.     Comments:  5/5 strength throughout. CN 2-12 intact.Equal grip strength.   Psychiatric:        Behavior: Behavior normal.      ED Treatments / Results  Labs (all labs ordered are listed, but only abnormal results are displayed) Labs Reviewed  COMPREHENSIVE METABOLIC PANEL - Abnormal; Notable for the following components:      Result Value   Potassium 3.3 (*)    Glucose, Bld 108 (*)    AST 14 (*)    All other components within normal limits  CBC - Abnormal; Notable for the following components:   RBC 3.62 (*)    Hemoglobin 10.3 (*)    HCT 32.6 (*)    All other components within normal limits  URINALYSIS, ROUTINE W REFLEX MICROSCOPIC - Abnormal; Notable for the following components:   APPearance HAZY (*)    All other components within  normal limits  LIPASE, BLOOD  PREGNANCY, URINE    EKG None  Radiology Ct Abdomen Pelvis W Contrast  Result Date: 02/13/2018 CLINICAL DATA:  Periumbilical pain for 1 week. Nausea.  EXAM: CT ABDOMEN AND PELVIS WITH CONTRAST TECHNIQUE: Multidetector CT imaging of the abdomen and pelvis was performed using the standard protocol following bolus administration of intravenous contrast. CONTRAST:  100mL ISOVUE-300 IOPAMIDOL (ISOVUE-300) INJECTION 61% COMPARISON:  11/28/2015 FINDINGS: Lower chest: Dependent atelectasis in the lung bases. Hepatobiliary: No focal liver abnormality is seen. No gallstones, gallbladder wall thickening, or biliary dilatation. Pancreas: Unremarkable. No pancreatic ductal dilatation or surrounding inflammatory changes. Spleen: Normal in size without focal abnormality. Adrenals/Urinary Tract: Adrenal glands are unremarkable. Kidneys are normal, without renal calculi, focal lesion, or hydronephrosis. Bladder is unremarkable. Stomach/Bowel: Stomach is within normal limits. Appendix appears normal. No evidence of bowel wall thickening, distention, or inflammatory changes. Vascular/Lymphatic: No significant vascular findings are present. No enlarged abdominal or pelvic lymph nodes. Reproductive: Uterus and bilateral adnexa are unremarkable. Other: No abdominal wall hernia or abnormality. No abdominopelvic ascites. Musculoskeletal: No acute or significant osseous findings. IMPRESSION: No acute process demonstrated in the abdomen or pelvis. No evidence of bowel obstruction or inflammation. Appendix is normal. Electronically Signed   By: Burman NievesWilliam  Stevens M.D.   On: 02/13/2018 01:47    Procedures Procedures (including critical care time)  Medications Ordered in ED Medications - No data to display   Initial Impression / Assessment and Plan / ED Course  I have reviewed the triage vital signs and the nursing notes.  Pertinent labs & imaging results that were available during my care of  the patient were reviewed by me and considered in my medical decision making (see chart for details).    5 days of mid abdominal pain with nausea and diarrhea.  hCG is negative and urinalysis is negative  Abdomen soft without peritoneal signs.  Periumbilical pain with voluntary guarding.  No right lower quadrant tenderness.  Foley in place Labs with stable anemia.  CT scan obtained to evaluate for appendix.  This is normal and does not show any other acute pathology.  Patient tolerating p.o. in the ED.  Follow-up with PCP.  Return precautions discussed including worsening pain, fever, vomiting or other concerns.  BP 117/86 (BP Location: Left Arm)   Pulse 83   Temp 97.8 F (36.6 C) (Oral)   Resp 12   Ht 5\' 4"  (1.626 m)   Wt 102.1 kg   LMP 01/17/2018   SpO2 99%   BMI 38.62 kg/m    Final Clinical Impressions(s) / ED Diagnoses   Final diagnoses:  Generalized abdominal pain    ED Discharge Orders    None       Glynn Octaveancour, Areg Bialas, MD 02/13/18 0410

## 2018-03-25 ENCOUNTER — Emergency Department (HOSPITAL_BASED_OUTPATIENT_CLINIC_OR_DEPARTMENT_OTHER)
Admission: EM | Admit: 2018-03-25 | Discharge: 2018-03-25 | Disposition: A | Discharge status: Home / Self Care | Payer: Self-pay | Attending: Emergency Medicine | Admitting: Emergency Medicine

## 2018-03-25 ENCOUNTER — Encounter (HOSPITAL_BASED_OUTPATIENT_CLINIC_OR_DEPARTMENT_OTHER): Payer: Self-pay | Admitting: Emergency Medicine

## 2018-03-25 ENCOUNTER — Other Ambulatory Visit: Payer: Self-pay

## 2018-03-25 DIAGNOSIS — F17210 Nicotine dependence, cigarettes, uncomplicated: Secondary | ICD-10-CM | POA: Insufficient documentation

## 2018-03-25 DIAGNOSIS — R69 Illness, unspecified: Secondary | ICD-10-CM

## 2018-03-25 DIAGNOSIS — J111 Influenza due to unidentified influenza virus with other respiratory manifestations: Secondary | ICD-10-CM | POA: Insufficient documentation

## 2018-03-25 MED ORDER — SODIUM CHLORIDE 0.9 % IV BOLUS
1000.0000 mL | Freq: Once | INTRAVENOUS | Status: AC
  Administered 2018-03-25: 1000 mL via INTRAVENOUS

## 2018-03-25 MED ORDER — ONDANSETRON 4 MG PO TBDP: 4.0000 mg | ORAL_TABLET | Freq: Three times a day (TID) | ORAL | 0 refills | Status: AC | PRN

## 2018-03-25 MED ORDER — ACETAMINOPHEN 325 MG PO TABS
ORAL_TABLET | ORAL | Status: AC
  Administered 2018-03-25: 650 mg via ORAL
  Filled 2018-03-25: qty 2

## 2018-03-25 MED ORDER — ONDANSETRON HCL 4 MG/2ML IJ SOLN
4.0000 mg | Freq: Once | INTRAMUSCULAR | Status: AC
  Administered 2018-03-25: 4 mg via INTRAVENOUS
  Filled 2018-03-25: qty 2

## 2018-03-25 MED ORDER — ACETAMINOPHEN 325 MG PO TABS
650.0000 mg | ORAL_TABLET | Freq: Once | ORAL | Status: AC | PRN
  Administered 2018-03-25: 650 mg via ORAL

## 2018-03-25 NOTE — ED Notes (Signed)
Ginger Ale given for PO challenge.

## 2018-03-25 NOTE — ED Provider Notes (Signed)
[facility] EMERGENCY DEPARTMENT Provider Note   CSN: [id number] Arrival date & time: 03/25/18  1446    History   Chief Complaint Chief Complaint  Patient presents with  . flu like symptoms    HPI Elizabeth Mendoza is a 38 y.o. female who presents for evaluation of 2 days of cough, generalized body aches, subjective fever chills, fatigue, headache, ear pain, nausea/diarrhea, generalized abdominal pain.  She states that she has had a productive cough that is productive of clear phlegm.  No hemoptysis.  She reports she has not been taking any NSAIDs for fever.  She is only been taking TheraFlu with minimal improvement.  Patient reports that she has decreased appetite and feels very fatigued.  She has not been vomiting.  She currently denies any abdominal pain.  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  She did get a flu shot this year.  She denies any chest pain, difficulty breathing.      The history is provided by the patient.    Past Medical History:  Diagnosis Date  . Anemia   . Anxiety   . Depression   . Gonorrhea   . Hernia of abdominal wall   . Shortness of breath   . Skin abnormalities    Hiradenitis suppurativa  . Tooth decay   . Ulcer     Patient Active Problem List   Diagnosis Date Noted  . 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 Hidradenitis suppurativa of left axilla 12/24/2014  . Acute blood loss anemia 12/24/2014  . Eczema 12/20/2013    Past Surgical History:  Procedure Laterality Date  . DILATION AND CURETTAGE OF UTERUS    . INDUCED ABORTION    . LAPAROSCOPY N/A 12/23/2014   Procedure: LAPAROSCOPY OPERATIVE, Right Salpingectomy with Removal Ectopic Pregnancy, Fulguration Left Fallopian Tube, Cystotomy Right Ovarian Cyst;  Surgeon: Lesly Dukes, MD;  Location: [facility];  Service: Gynecology;  Laterality: N/A;  . TUBAL LIGATION       OB History    Gravida  10   Para  5   Term  5   Preterm  0   AB  5   Living  5     SAB  1   TAB  3   Ectopic  1   Multiple  0   Live Births  5            Home Medications    Prior to Admission medications   Medication Sig Start Date End Date Taking? Authorizing Provider  ondansetron (ZOFRAN ODT) 4 MG disintegrating tablet Take 1 tablet (4 mg total) by mouth every 8 (eight) hours as needed for nausea or vomiting. 03/25/18   Maxwell Caul, PA-C    Family History Family History  Problem Relation Age of Onset  . Diabetes Father   . Anesthesia problems Neg Hx     Social History Social History   Tobacco Use  . Smoking status: Current Every Day Smoker    Packs/day: 0.50    Years: 15.00    Pack years: 7.50    Types: Cigarettes  . Smokeless tobacco: Never Used  Substance Use Topics  . Alcohol use: Not Currently    Comment: occ  . Drug use: No     Allergies   Aspirin   Review of Systems Review of Systems  Constitutional: Positive for appetite change, chills, fatigue and fever.  HENT: Positive for congestion.   Respiratory: Positive  for cough. Negative for shortness of breath.   Cardiovascular: Negative for chest pain.  Gastrointestinal: Positive for abdominal pain, diarrhea and nausea. Negative for vomiting.  Genitourinary: Negative for dysuria and hematuria.  Musculoskeletal: Positive for myalgias.  Neurological: Positive for headaches.  All other systems reviewed and are negative.    Physical Exam Updated Vital Signs BP 125/79 (BP Location: Left Arm)   Pulse (!) 101   Temp 99.2 F (37.3 C) (Oral)   Resp 16   Ht 5\' 4"  (1.626 m)   Wt 102.1 kg   LMP 03/12/2018   SpO2 96%   BMI 38.62 kg/m   Physical Exam Vitals signs and nursing note reviewed.  Constitutional:      Appearance: Normal appearance. She is well-developed.  HENT:     Head: Normocephalic and atraumatic.     Right Ear: Tympanic membrane normal.     Left Ear: Tympanic membrane normal.     Mouth/Throat:     Pharynx: Oropharynx is  clear.     Comments: Posterior oropharynx is clear without any warmth, erythema or edema. No exudates.  Eyes:     General: Lids are normal.     Conjunctiva/sclera: Conjunctivae normal.     Pupils: Pupils are equal, round, and reactive to light.  Neck:     Musculoskeletal: Full passive range of motion without pain.  Cardiovascular:     Rate and Rhythm: Normal rate and regular rhythm.     Pulses: Normal pulses.     Heart sounds: Normal heart sounds. No murmur. No friction rub. No gallop.   Pulmonary:     Effort: Pulmonary effort is normal.     Breath sounds: Normal breath sounds.     Comments: Lungs clear to auscultation bilaterally.  Symmetric chest rise.  No wheezing, rales, rhonchi. Abdominal:     Palpations: Abdomen is soft. Abdomen is not rigid.     Tenderness: There is no abdominal tenderness. There is no guarding.     Comments: Abdomen is soft, non-distended, non-tender. No rigidity, No guarding. No peritoneal signs.  Musculoskeletal: Normal range of motion.  Skin:    General: Skin is warm and dry.     Capillary Refill: Capillary refill takes less than 2 seconds.  Neurological:     Mental Status: She is alert and oriented to person, place, and time.  Psychiatric:        Speech: Speech normal.       ED Treatments / Results  Labs (all labs ordered are listed, but only abnormal results are displayed) Labs Reviewed - No data to display  EKG None  Radiology No results found.  Procedures Procedures (including critical care time)  Medications Ordered in ED Medications  acetaminophen (TYLENOL) tablet 650 mg (650 mg Oral Given 03/25/18 1457)  sodium chloride 0.9 % bolus 1,000 mL ( Intravenous Stopped 03/25/18 1751)  ondansetron (ZOFRAN) injection 4 mg (4 mg Intravenous Given 03/25/18 1649)     Initial Impression / Assessment and Plan / ED Course  I have reviewed the triage vital signs and the nursing notes.  Pertinent labs & imaging results that were available  during my care of the patient were reviewed by me and considered in my medical decision making (see chart for details).        38 year old female who presents for evaluation of 2 days of generalized body aches, cough, congestion, subjective fever chills, decreased appetite.  She also had some nausea/diarrhea and generalized abdominal pain.  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  She got a flu shot here.  On initial ED arrival, patient is febrile and slightly tachycardic.  Vitals otherwise stable.  Fever improved after analgesics here in the ED.  Lungs clear to auscultation bilaterally.  Abdominal exam is benign.  Given consolation of symptoms, consider influenza.  She has passed the 48-hour mark for treatment therefore no indication for swabbing her for the flu.  Will give fluids for tachycardia and evaluate.   Reevaluation after fluids.  Tachycardia improved significantly.  She is still slightly tachycardic patient states she is ready to go.  Fever has improved significantly.  Encourage at home supportive care measures. At this time, patient exhibits no emergent life-threatening condition that require further evaluation in ED or admission. Patient had ample opportunity for questions and discussion. All patient's questions were answered with full understanding. Strict return precautions discussed. Patient expresses understanding and agreement to plan.   Portions of this note were generated with Scientist, clinical (histocompatibility and immunogenetics). Dictation errors may occur despite best attempts at proofreading.   Final Clinical Impressions(s) / ED Diagnoses   Final diagnoses:  Influenza-like illness    ED Discharge Orders         Ordered    ondansetron (ZOFRAN ODT) 4 MG disintegrating tablet  Every 8 hours PRN     03/25/18 1832           Maxwell Caul, PA-C 03/26/18 1610    Pricilla Loveless, MD 03/26/18 [phone]

## 2018-03-25 NOTE — ED Triage Notes (Signed)
Cough, body aches x 2 days

## 2018-03-25 NOTE — Discharge Instructions (Signed)
You can take Tylenol or Ibuprofen as directed for pain. You can alternate Tylenol and Ibuprofen every 4 hours. If you take Tylenol at 1pm, then you can take Ibuprofen at 5pm. Then you can take Tylenol again at 9pm.   Zofran as directed for nausea/vomiting.  Make sure you are getting plenty of rest and fluids.  Return emergency department any high fever despite Tylenol or ibuprofen, vomiting, chest pain, difficulty breathing or any other worsening or concerning symptoms.

## 2018-05-03 ENCOUNTER — Emergency Department (HOSPITAL_BASED_OUTPATIENT_CLINIC_OR_DEPARTMENT_OTHER)
Admission: EM | Admit: 2018-05-03 | Discharge: 2018-05-03 | Disposition: A | Discharge status: Home / Self Care | Payer: Self-pay | Attending: Emergency Medicine | Admitting: Emergency Medicine

## 2018-05-03 ENCOUNTER — Other Ambulatory Visit: Payer: Self-pay

## 2018-05-03 ENCOUNTER — Encounter (HOSPITAL_BASED_OUTPATIENT_CLINIC_OR_DEPARTMENT_OTHER): Payer: Self-pay | Admitting: Emergency Medicine

## 2018-05-03 DIAGNOSIS — R197 Diarrhea, unspecified: Secondary | ICD-10-CM | POA: Insufficient documentation

## 2018-05-03 DIAGNOSIS — R112 Nausea with vomiting, unspecified: Secondary | ICD-10-CM | POA: Insufficient documentation

## 2018-05-03 DIAGNOSIS — F17210 Nicotine dependence, cigarettes, uncomplicated: Secondary | ICD-10-CM | POA: Insufficient documentation

## 2018-05-03 MED ORDER — ONDANSETRON 4 MG PO TBDP: 4.0000 mg | ORAL_TABLET | Freq: Three times a day (TID) | ORAL | 0 refills | Status: AC | PRN

## 2018-05-03 NOTE — Discharge Instructions (Addendum)
You were evaluated in the Emergency Department and after careful evaluation, we did not find any emergent condition requiring admission or further testing in the hospital.  Your symptoms today seem to be due to a viral illness.  Please use the Zofran medication as needed for nausea.  Please drink plenty of fluids at home.  Please use Tylenol or ibuprofen at home for general discomfort.  It is unlikely that you have the novel coronavirus.  Still, we recommend home isolation for at least 7 days from the time he became ill.  And please do not return to work until you are without fever for at least 3 days.  Please return to the Emergency Department if you experience any worsening of your condition.  We encourage you to follow up with a primary care provider.  Thank you for allowing [place] to be a part of your care.

## 2018-05-03 NOTE — ED Triage Notes (Signed)
Pt c/o of vomiting since yesterday, headache and diarrhea today.

## 2018-05-03 NOTE — ED Provider Notes (Signed)
[facility] Emergency Department Provider Note MRN:  [id number]  Arrival date & time: 05/03/18     Chief Complaint   Emesis   History of Present Illness   Elizabeth Mendoza is a 38 y.o. year-old female with a history of anxiety, depression presenting to the ED with chief complaint of emesis.  1 day of nausea, vomiting, diarrhea.  Denies abdominal pain.  Symptoms have been constant, no blood in the stool or vomit, no recent travel, no recent antibiotics.  Denies fever, no cough, no chest pain or shortness of breath, no numbness weakness to the arms or legs.  Symptoms are constant, moderate, no exacerbating relieving factors.  Review of Systems  A complete 10 system review of systems was obtained and all systems are negative except as noted in the HPI and PMH.   Patient's Health History    Past Medical History:  Diagnosis Date  . Anemia   . Anxiety   . Depression   . Gonorrhea   . Hernia of abdominal wall   . Shortness of breath   . Skin abnormalities    Hiradenitis suppurativa  . Tooth decay   . Ulcer     Past Surgical History:  Procedure Laterality Date  . DILATION AND CURETTAGE OF UTERUS    . INDUCED ABORTION    . LAPAROSCOPY N/A 12/23/2014   Procedure: LAPAROSCOPY OPERATIVE, Right Salpingectomy with Removal Ectopic Pregnancy, Fulguration Left Fallopian Tube, Cystotomy Right Ovarian Cyst;  Surgeon: Lesly Dukes, MD;  Location: [facility];  Service: Gynecology;  Laterality: N/A;  . TUBAL LIGATION      Family History  Problem Relation Age of Onset  . Diabetes Father   . Anesthesia problems Neg Hx     Social History   Socioeconomic History  . Marital status: Single    Spouse name: Not on file  . Number of children: Not on file  . Years of education: Not on file  . Highest education level: Not on file  Occupational History  . Not on file  Social Needs  . Financial resource strain: Not on file  . Food insecurity:    Worry: Not on file   Inability: Not on file  . Transportation needs:    Medical: Not on file    Non-medical: Not on file  Tobacco Use  . Smoking status: Current Every Day Smoker    Packs/day: 0.50    Years: 15.00    Pack years: 7.50    Types: Cigarettes  . Smokeless tobacco: Never Used  Substance and Sexual Activity  . Alcohol use: Not Currently    Comment: occ  . Drug use: No  . Sexual activity: Yes    Birth control/protection: None  Lifestyle  . Physical activity:    Days per week: Not on file    Minutes per session: Not on file  . Stress: Not on file  Relationships  . Social connections:    Talks on phone: Not on file    Gets together: Not on file    Attends religious service: Not on file    Active member of club or organization: Not on file    Attends meetings of clubs or organizations: Not on file    Relationship status: Not on file  . Intimate partner violence:    Fear of current or ex partner: Not on file    Emotionally abused: Not on file    Physically abused: Not on file    Forced sexual  activity: Not on file  Other Topics Concern  . Not on file  Social History Narrative  . Not on file     Physical Exam  Vital Signs and Nursing Notes reviewed Vitals:   05/03/18 0821  BP: (!) 125/95  Pulse: 89  Resp: 14  Temp: 97.8 F (36.6 C)  SpO2: 99%    CONSTITUTIONAL: Well-appearing, NAD NEURO:  Alert and oriented x 3, no focal deficits EYES:  eyes equal and reactive ENT/NECK:  no LAD, no JVD CARDIO: Regular rate, well-perfused, normal S1 and S2 PULM:  CTAB no wheezing or rhonchi GI/GU:  normal bowel sounds, non-distended, non-tender MSK/SPINE:  No gross deformities, no edema SKIN:  no rash, atraumatic PSYCH:  Appropriate speech and behavior  Diagnostic and Interventional Summary    Labs Reviewed - No data to display  No orders to display    Medications - No data to display   Procedures Critical Care  ED Course and Medical Decision Making  I have reviewed the triage  vital signs and the nursing notes.  Pertinent labs & imaging results that were available during my care of the patient were reviewed by me and considered in my medical decision making (see below for details).  Well-appearing, normal vital signs, soft and completely nontender abdomen in this 38 year old female with symptoms consistent with viral gastroenteritis.  No indication for laboratory or imaging testing here today, appropriate for discharge with strict return precautions for abdominal pain or worsening symptoms.  After the discussed management above, the patient was determined to be safe for discharge.  The patient was in agreement with this plan and all questions regarding their care were answered.  ED return precautions were discussed and the patient will return to the ED with any significant worsening of condition.  Elmer Sow. Pilar Plate, MD [facility] Emergency Medicine [facility] [email or web address]  Final Clinical Impressions(s) / ED Diagnoses     ICD-10-CM   1. Nausea vomiting and diarrhea R11.2    R19.7     ED Discharge Orders         Ordered    ondansetron (ZOFRAN ODT) 4 MG disintegrating tablet  Every 8 hours PRN     05/03/18 0844             Sabas Sous, MD 05/03/18 [phone]

## 2018-05-03 NOTE — ED Notes (Signed)
C/o vomiting onset yesterday w ha and diarrhea

## 2020-01-16 IMAGING — CT CT ABD-PELV W/ CM
2 of 4 series · 16 of 46 positions shown, 18 images · IV contrast (iopamidol)
Comparison: 11/28/2015

CLINICAL DATA: Periumbilical pain for 1 week. Nausea.

EXAM:
CT ABDOMEN AND PELVIS WITH CONTRAST
TECHNIQUE: Multidetector CT imaging of the abdomen and pelvis was performed
using the standard protocol following bolus administration of
intravenous contrast.
CONTRAST:  100mL OMTHKO-8SS IOPAMIDOL (OMTHKO-8SS) INJECTION 61%

[Series 2: axial st · axial · 0.83mm/px · z∈[-355,+60]mm · 13 of 91 slices shown, 15 images]
[im 4/91  soft-tissue]
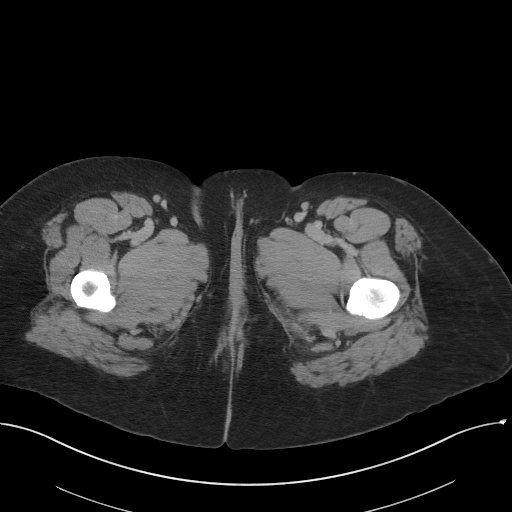
[im 4/91  bone]
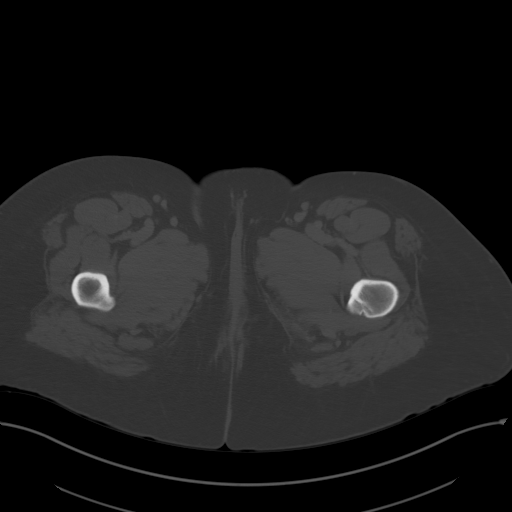
[im 12/91  soft-tissue]
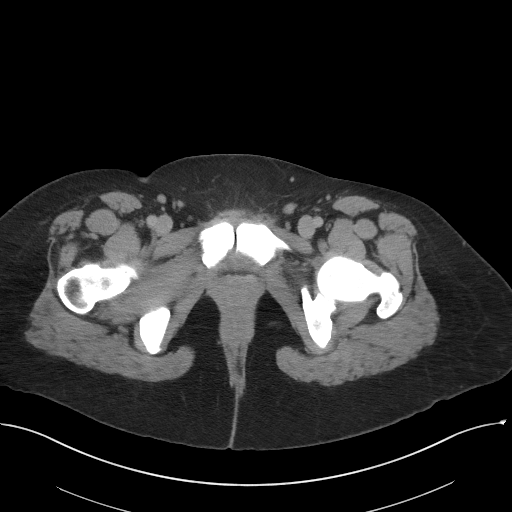
[im 19/91  soft-tissue]
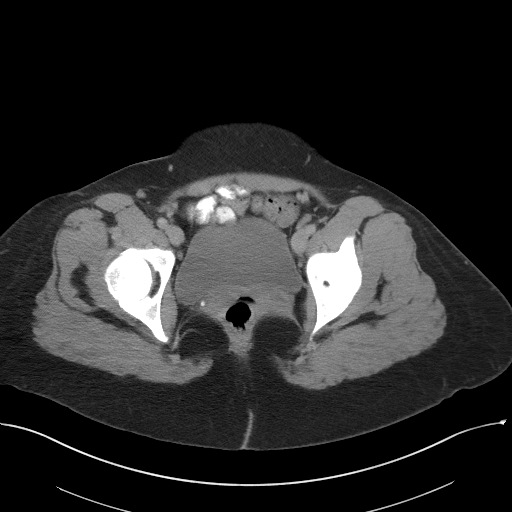
[im 27/91  soft-tissue]
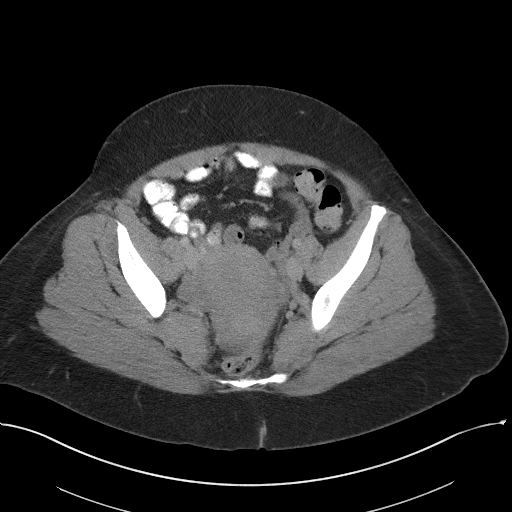
[im 31/91  soft-tissue]
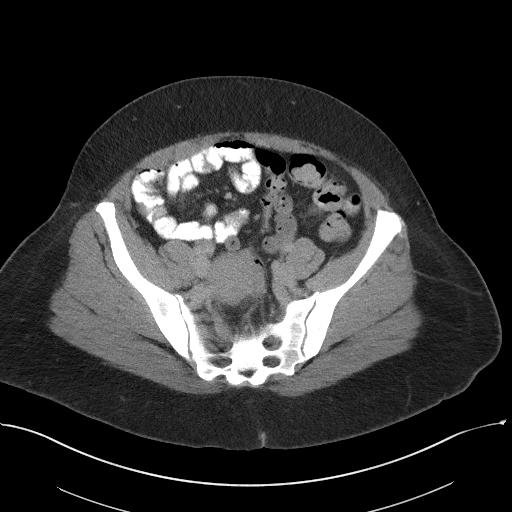
[im 38/91  soft-tissue]
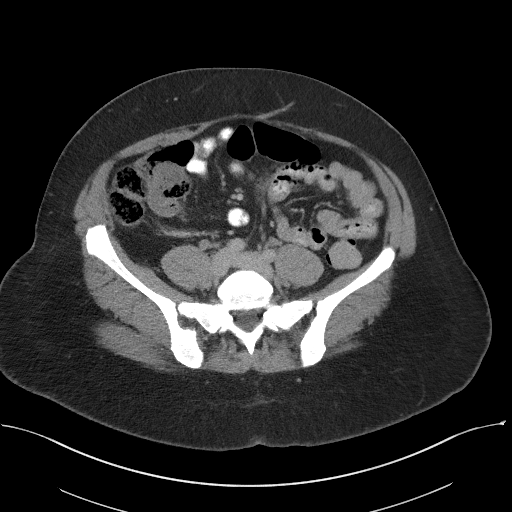
[im 46/91  soft-tissue]
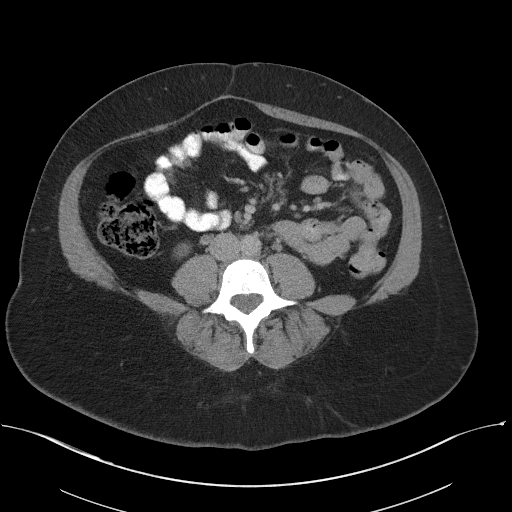
[im 53/91  soft-tissue]
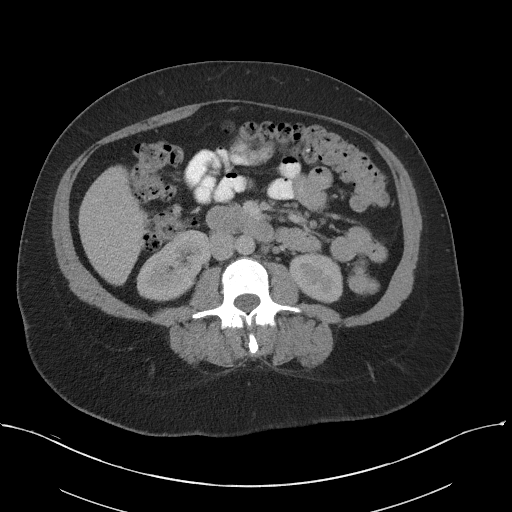
[im 61/91  soft-tissue]
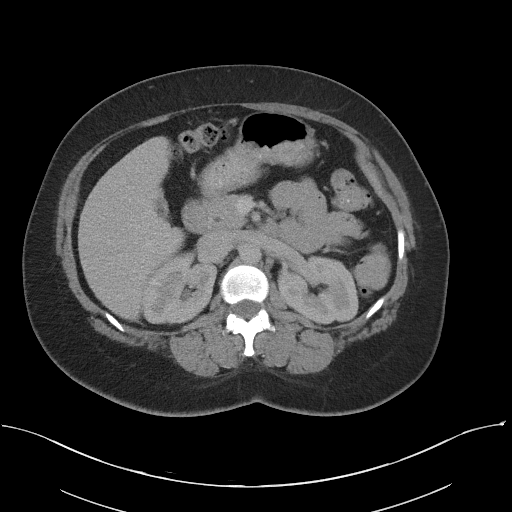
[im 61/91  bone]
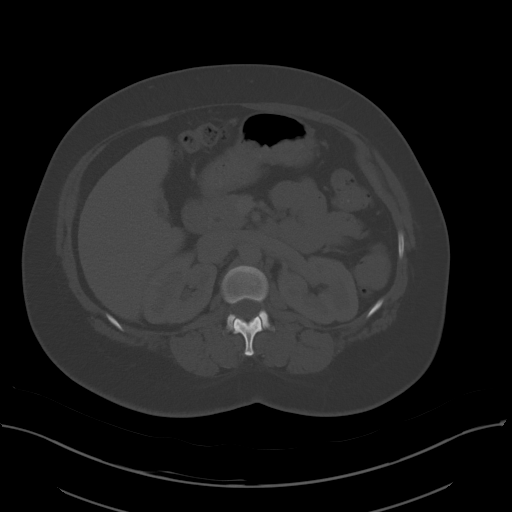
[im 64/91  soft-tissue]
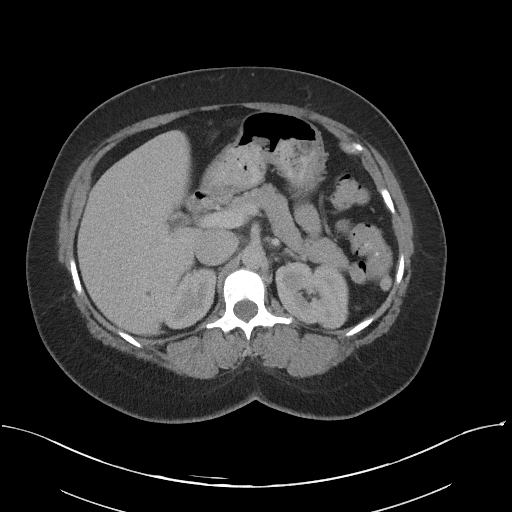
[im 72/91  soft-tissue]
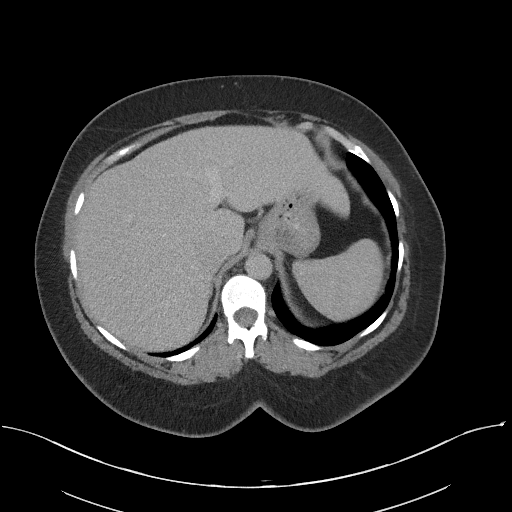
[im 79/91  soft-tissue]
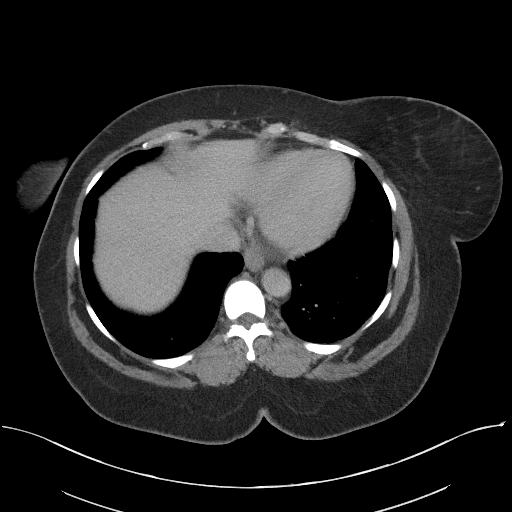
[im 87/91  soft-tissue]
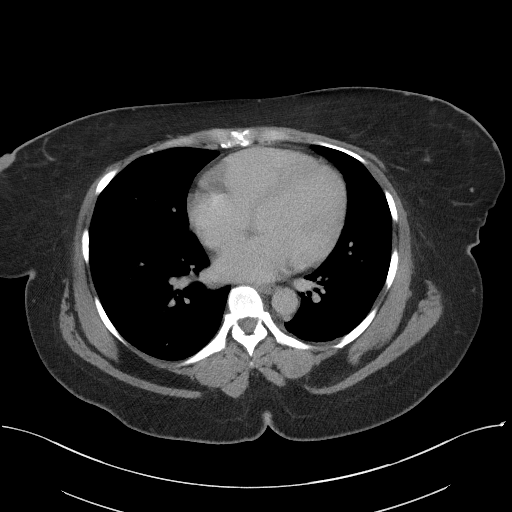

[Series 5: coronal st · coronal · 0.77mm/px · 3 of 95 slices shown]
[im 32/95  soft-tissue]
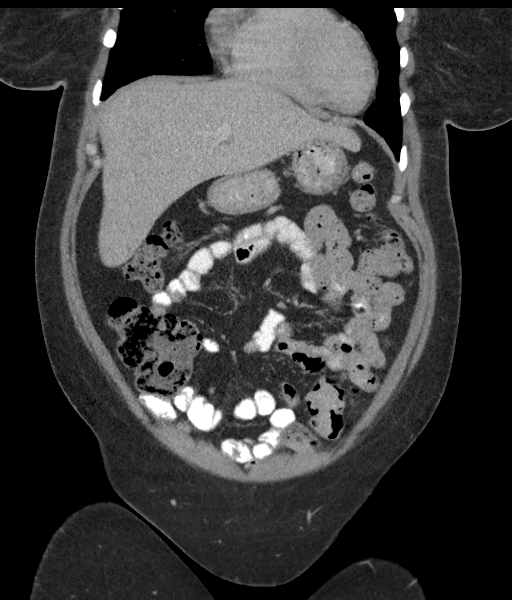
[im 42/95  soft-tissue]
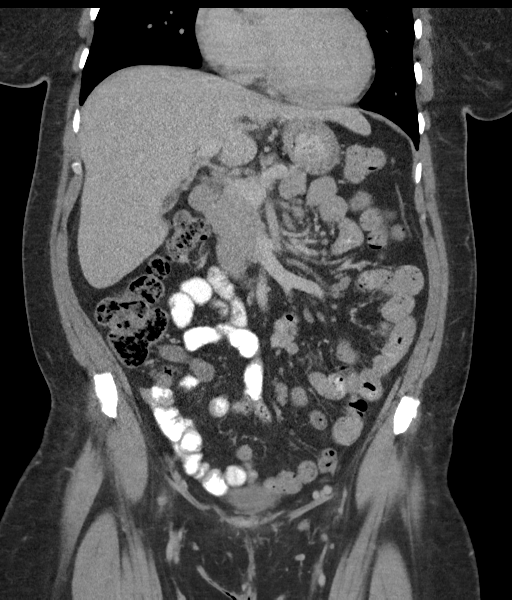
[im 53/95  soft-tissue]
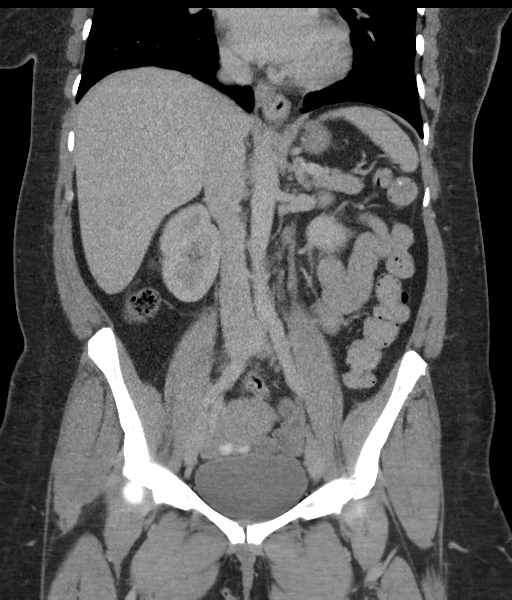

[16 of 46 positions shown; findings below may reference images not displayed]

FINDINGS: Lower chest: Dependent atelectasis in the lung bases.

Hepatobiliary: No focal liver abnormality is seen. No gallstones,
gallbladder wall thickening, or biliary dilatation.

Pancreas: Unremarkable. No pancreatic ductal dilatation or
surrounding inflammatory changes.

Spleen: Normal in size without focal abnormality.

Adrenals/Urinary Tract: Adrenal glands are unremarkable. Kidneys are
normal, without renal calculi, focal lesion, or hydronephrosis.
Bladder is unremarkable.

Stomach/Bowel: Stomach is within normal limits. Appendix appears
normal. No evidence of bowel wall thickening, distention, or
inflammatory changes.

Vascular/Lymphatic: No significant vascular findings are present. No
enlarged abdominal or pelvic lymph nodes.

Reproductive: Uterus and bilateral adnexa are unremarkable.

Other: No abdominal wall hernia or abnormality. No abdominopelvic
ascites.

Musculoskeletal: No acute or significant osseous findings.
IMPRESSION: No acute process demonstrated in the abdomen or pelvis. No evidence
of bowel obstruction or inflammation. Appendix is normal.

## 2020-09-16 ENCOUNTER — Encounter (HOSPITAL_COMMUNITY): Payer: Self-pay | Admitting: Emergency Medicine

## 2020-09-16 ENCOUNTER — Emergency Department (HOSPITAL_COMMUNITY)
Admission: EM | Admit: 2020-09-16 | Discharge: 2020-09-16 | Disposition: A | Discharge status: Home / Self Care | Payer: No Typology Code available for payment source | Attending: Emergency Medicine | Admitting: Emergency Medicine

## 2020-09-16 DIAGNOSIS — M2550 Pain in unspecified joint: Secondary | ICD-10-CM | POA: Diagnosis present

## 2020-09-16 DIAGNOSIS — I10 Essential (primary) hypertension: Secondary | ICD-10-CM | POA: Insufficient documentation

## 2020-09-16 DIAGNOSIS — Z5321 Procedure and treatment not carried out due to patient leaving prior to being seen by health care provider: Secondary | ICD-10-CM | POA: Insufficient documentation

## 2020-09-16 NOTE — ED Provider Notes (Signed)
Emergency Medicine Provider Triage Evaluation Note  Elizabeth Mendoza , a 40 y.o. female  was evaluated in triage.  Pt complains of multiple complaints.  Complains of pain to dorsum of bilateral feet, left wrist pain, and numbness to left fourth and fifth fingers.  Patient reports that these pains have been ongoing for months.  Patient reports that she initially came to the emergency department today after being told she had an elevated blood pressure while in class today.  Was told that the systolic number was in the 150s.  Patient endorses history of hypertension, states that she has not taken any medications for hypertension for a while.  Review of Systems  Positive: Arthralgias, hypertension Negative: Fever, chills  Physical Exam  BP 135/90   Pulse (!) 103   Temp 98.4 F (36.9 C) (Oral)   Resp 18   LMP 09/04/2020   SpO2 99%  Gen:   Awake, no distress   Resp:  Normal effort  MSK:   Moves extremities without difficulty, patient has full range of motion to left wrist without tenderness or deformity.  Sensation intact to all digits of left hand.  Patient has edema noted to bilateral feet and ankles. Other:  +2 radial pulse bilaterally.  Patient able to stand and ambulate without difficulty.  Medical Decision Making  Medically screening exam initiated at 3:07 PM.  Appropriate orders placed.  Elizabeth Mendoza was informed that the remainder of the evaluation will be completed by another provider, this initial triage assessment does not replace that evaluation, and the importance of remaining in the ED until their evaluation is complete.  The patient appears stable so that the remainder of the work up may be completed by another provider.      Elizabeth Mendoza 09/16/20 1510    Terrilee Files, MD 09/16/20 [phone]

## 2020-09-16 NOTE — ED Triage Notes (Signed)
Patient c/o joint pain all over for months. States she initially came after taking BP in class with elevated reading. 135/90 in triage.

## 2020-09-16 NOTE — ED Notes (Signed)
Pt has been called x3 at noted times. Pt has not been present to name call or visible throughout ED lobby. Triage RN notified. 

## 2022-01-27 ENCOUNTER — Ambulatory Visit: Payer: No Typology Code available for payment source | Admitting: Obstetrics

## 2022-05-31 ENCOUNTER — Other Ambulatory Visit: Payer: Self-pay

## 2022-05-31 ENCOUNTER — Emergency Department (HOSPITAL_COMMUNITY)

## 2022-05-31 ENCOUNTER — Emergency Department (HOSPITAL_COMMUNITY)
Admission: EM | Admit: 2022-05-31 | Discharge: 2022-05-31 | Disposition: A | Discharge status: Home / Self Care | Attending: Emergency Medicine | Admitting: Emergency Medicine

## 2022-05-31 DIAGNOSIS — D75839 Thrombocytosis, unspecified: Secondary | ICD-10-CM | POA: Diagnosis not present

## 2022-05-31 DIAGNOSIS — D72829 Elevated white blood cell count, unspecified: Secondary | ICD-10-CM | POA: Diagnosis not present

## 2022-05-31 DIAGNOSIS — I10 Essential (primary) hypertension: Secondary | ICD-10-CM | POA: Insufficient documentation

## 2022-05-31 DIAGNOSIS — L732 Hidradenitis suppurativa: Secondary | ICD-10-CM | POA: Insufficient documentation

## 2022-05-31 DIAGNOSIS — R350 Frequency of micturition: Secondary | ICD-10-CM | POA: Diagnosis not present

## 2022-05-31 DIAGNOSIS — R7401 Elevation of levels of liver transaminase levels: Secondary | ICD-10-CM | POA: Diagnosis not present

## 2022-05-31 DIAGNOSIS — R1030 Lower abdominal pain, unspecified: Secondary | ICD-10-CM | POA: Insufficient documentation

## 2022-05-31 DIAGNOSIS — R770 Abnormality of albumin: Secondary | ICD-10-CM | POA: Diagnosis not present

## 2022-05-31 DIAGNOSIS — L02413 Cutaneous abscess of right upper limb: Secondary | ICD-10-CM | POA: Diagnosis present

## 2022-05-31 LAB — COMPREHENSIVE METABOLIC PANEL
ALT: 17 U/L (ref 0–44)
AST: 13 U/L — ABNORMAL LOW (ref 15–41)
Albumin: 3.3 g/dL — ABNORMAL LOW (ref 3.5–5.0)
Alkaline Phosphatase: 72 U/L (ref 38–126)
Anion gap: 11 (ref 5–15)
BUN: 11 mg/dL (ref 6–20)
CO2: 26 mmol/L (ref 22–32)
Calcium: 9.5 mg/dL (ref 8.9–10.3)
Chloride: 102 mmol/L (ref 98–111)
Creatinine, Ser: 0.7 mg/dL (ref 0.44–1.00)
GFR, Estimated: >60 mL/min (ref >60)
Glucose, Bld: 98 mg/dL (ref 70–99)
Potassium: 3.8 mmol/L (ref 3.5–5.1)
Sodium: 139 mmol/L (ref 135–145)
Total Bilirubin: 0.4 mg/dL (ref 0.3–1.2)
Total Protein: 7.2 g/dL (ref 6.5–8.1)

## 2022-05-31 LAB — CBC
HCT: 33.7 % — ABNORMAL LOW (ref 36.0–46.0)
Hemoglobin: 11 g/dL — ABNORMAL LOW (ref 12.0–15.0)
MCH: 27.8 pg (ref 26.0–34.0)
MCHC: 32.6 g/dL (ref 30.0–36.0)
MCV: 85.3 fL (ref 80.0–100.0)
Platelets: 424 10*3/uL — ABNORMAL HIGH (ref 150–400)
RBC: 3.95 MIL/uL (ref 3.87–5.11)
RDW: 16.6 % — ABNORMAL HIGH (ref 11.5–15.5)
WBC: 11.7 10*3/uL — ABNORMAL HIGH (ref 4.0–10.5)
nRBC: 0 % (ref 0.0–0.2)

## 2022-05-31 LAB — I-STAT BETA HCG BLOOD, ED (MC, WL, AP ONLY): I-stat hCG, quantitative: <5 m[IU]/mL (ref <5)

## 2022-05-31 LAB — LIPASE, BLOOD: Lipase: 28 U/L (ref 11–51)

## 2022-05-31 LAB — PREGNANCY, URINE: Preg Test, Ur: NEGATIVE

## 2022-05-31 LAB — URINALYSIS, ROUTINE W REFLEX MICROSCOPIC
Bilirubin Urine: NEGATIVE
Glucose, UA: NEGATIVE mg/dL
Ketones, ur: NEGATIVE mg/dL
Nitrite: NEGATIVE
Protein, ur: NEGATIVE mg/dL
Specific Gravity, Urine: 1.025 (ref 1.005–1.030)
pH: 5 (ref 5.0–8.0)

## 2022-05-31 MED ORDER — AMOXICILLIN-POT CLAVULANATE 875-125 MG PO TABS: 1.0000 | ORAL_TABLET | Freq: Two times a day (BID) | ORAL | 0 refills | Status: AC

## 2022-05-31 NOTE — ED Triage Notes (Signed)
Pt states she is having lower back pain. Has been to her PCP and ED for the pain w/o any relief. Pt states she is urinating a lot and frequently. Also c/o stomach pain as well.

## 2022-05-31 NOTE — ED Provider Notes (Signed)
Signout received on this 42 year old female who presented with 3 weeks of symptoms.  She states she was previously seen in the ED and was prescribed medication that did not provide any improvement.  She denies any dysuria but endorses urgency and frequency.  No vaginal discharge.  She is in a monogamous relationship with her husband.  She does not have concern for for STI.  She also is concerned about her right axillary region where she has hidradenitis suppurativa.  She states she has 2 lesions which are spontaneously draining.  I believe the previous provider noted an area that was not draining.  She states that the area is also draining now.  No indication for I&D at this time.  Patient is in agreement with this plan.  She does have a PCP that she will follow-up with.  I will give her gynecology follow-up for her symptoms.  Physical Exam  BP (!) 134/108 (BP Location: Left Arm)   Pulse (!) 119   Temp 98.3 F (36.8 C) (Oral)   Resp 18   SpO2 100%     Procedures  Procedures  ED Course / MDM    Medical Decision Making Amount and/or Complexity of Data Reviewed Labs: ordered. Radiology: ordered.   Patient discharged in stable condition.  Will start her on Augmentin.  Return precautions discussed.  Initially tachycardic however this improved.       Marita Kansas, PA-C 05/31/22 1829    Mardene Sayer, MD 06/01/22 [phone]

## 2022-05-31 NOTE — ED Provider Notes (Cosign Needed Addendum)
EMERGENCY DEPARTMENT AT [facility] Provider Note   CSN: [id number] Arrival date & time: 05/31/22  1153     History  Chief Complaint  Patient presents with   Back Pain   Abscess    Right arm    Elizabeth Mendoza is a 42 y.o. female with history of hypertension and hidradenitis suppurativa presents the emergency room today for evaluation of suprapubic abdominal pain which she reports radiates to her back.  She describes it as a cramping sensation.  This is been going on for the past 3 weeks.  She reports some urgency and frequency.  She denies any saddle anesthesia, fevers, IV drug use, fecal incontinence, urinary incontinence, nausea, vomiting, diarrhea, constipation, dysuria, hematuria, vaginal discharge, or vaginal bleeding.  Patient reports that she has been seen by multiple doctors for this issue and they cannot find out why she is having this pain.  She has given birth 5 times.  No known drug allergies.  Tobacco use.  Denies any alcohol or illicit drug use.   Back Pain Associated symptoms: abdominal pain   Associated symptoms: no chest pain, no dysuria, no fever, no numbness, no pelvic pain and no weakness   Abscess Associated symptoms: no fever, no nausea and no vomiting        Home Medications Prior to Admission medications   Medication Sig Start Date End Date Taking? Authorizing Provider  amoxicillin-clavulanate (AUGMENTIN) 875-125 MG tablet Take 1 tablet by mouth every 12 (twelve) hours. 05/31/22  Yes Achille Rich, PA-C  ondansetron (ZOFRAN ODT) 4 MG disintegrating tablet Take 1 tablet (4 mg total) by mouth every 8 (eight) hours as needed for nausea or vomiting. 03/25/18   Graciella Freer A, PA-C  ondansetron (ZOFRAN ODT) 4 MG disintegrating tablet Take 1 tablet (4 mg total) by mouth every 8 (eight) hours as needed for nausea or vomiting. 05/03/18   Sabas Sous, MD      Allergies    Aspirin    Review of Systems   Review of Systems  Constitutional:   Negative for chills and fever.  Respiratory:  Negative for shortness of breath.   Cardiovascular:  Negative for chest pain.  Gastrointestinal:  Positive for abdominal pain. Negative for abdominal distention, constipation, diarrhea, nausea and vomiting.       Denies any fecal incontinence  Genitourinary:  Positive for frequency and urgency. Negative for dysuria, hematuria, pelvic pain, vaginal bleeding and vaginal discharge.       Denies any urinary incontinence or urinary retention.  Musculoskeletal:  Positive for back pain.       Denies any saddle anesthesia  Neurological:  Negative for weakness and numbness.    Physical Exam Updated Vital Signs BP (!) 135/92 (BP Location: Right Arm)   Pulse 91   Temp 98.3 F (36.8 C) (Oral)   Resp 18   SpO2 97%  Physical Exam Vitals and nursing note reviewed.  Constitutional:      General: She is not in acute distress.    Appearance: Normal appearance. She is not toxic-appearing.  HENT:     Head: Normocephalic and atraumatic.     Mouth/Throat:     Mouth: Mucous membranes are moist.  Eyes:     General: No scleral icterus. Cardiovascular:     Rate and Rhythm: Normal rate and regular rhythm.  Pulmonary:     Effort: Pulmonary effort is normal. No respiratory distress.     Breath sounds: Normal breath sounds.  Abdominal:  General: Bowel sounds are normal. There is no distension.     Palpations: Abdomen is soft.     Tenderness: There is abdominal tenderness. There is no guarding or rebound.     Comments: Mild tenderness to the suprapubic area. No rebound or guarding.   Musculoskeletal:        General: No tenderness or deformity.     Cervical back: Normal range of motion.     Comments: No midline or paraspinal tenderness to palpation. Negative straight leg raise.  Strength is equal in the patient's upper and lower bilateral extremities.  Skin:    General: Skin is warm and dry.  Neurological:     General: No focal deficit present.      Mental Status: She is alert. Mental status is at baseline.     Gait: Gait normal.     ED Results / Procedures / Treatments   Labs (all labs ordered are listed, but only abnormal results are displayed) Labs Reviewed  URINE CULTURE - Abnormal; Notable for the following components:      Result Value   Culture MULTIPLE SPECIES PRESENT, SUGGEST RECOLLECTION (*)    All other components within normal limits  URINALYSIS, ROUTINE W REFLEX MICROSCOPIC - Abnormal; Notable for the following components:   APPearance HAZY (*)    Hgb urine dipstick MODERATE (*)    Leukocytes,Ua TRACE (*)    Bacteria, UA FEW (*)    All other components within normal limits  COMPREHENSIVE METABOLIC PANEL - Abnormal; Notable for the following components:   Albumin 3.3 (*)    AST 13 (*)    All other components within normal limits  CBC - Abnormal; Notable for the following components:   WBC 11.7 (*)    Hemoglobin 11.0 (*)    HCT 33.7 (*)    RDW 16.6 (*)    Platelets 424 (*)    All other components within normal limits  LIPASE, BLOOD  PREGNANCY, URINE  I-STAT BETA HCG BLOOD, ED ([facility], [facility], [facility] ONLY)    EKG None  Radiology No results found.  Procedures Procedures   Medications Ordered in ED Medications - No data to display  ED Course/ Medical Decision Making/ A&P                           Medical Decision Making Amount and/or Complexity of Data Reviewed Labs: ordered. Radiology: ordered.  Risk Prescription drug management.  42 y.o. female presents to the ER for evaluation of suprapubic abdominal pain, back pain, and urgency/frequency. Differential diagnosis includes but is not limited to AAA, mesenteric ischemia, appendicitis, diverticulitis, DKA, gastroenteritis, nephrolithiasis, pancreatitis, constipation, UTI, bowel obstruction, biliary disease, IBD, PUD, hepatitis, ectopic pregnancy, ovarian torsion, PID. Vital signs shows mildly elevated blood pressure otherwise unremarkable. Physical exam as  noted above.   Will order CT abdomen pelvis with L-spine add on.  Basic labs ordered as well.  I independently reviewed and interpreted the patient's labs. hCG negative.  CMP shows mildly decreased albumin and AST otherwise no electrolyte or LFT abnormality.  Lipase within normal limits.  CBC shows slight elevated white blood cell count 11.7 with a slightly elevated platelet count as well.  Anemia with a hemoglobin of 11, appears around patient's baseline.  Urinalysis shows hazy urine with mod amount of hemoglobin.  Trace leukocytes, few bacteria but 6-10 white blood cells seen.  I doubt any cauda equina as patient does not have any reproducible back pain.  She has no fecal or urinary incontinence, urinary retention, fever, or IV drug use.  Likely some pelvic floor dysfunction given her multigravida.  Handoff to oncoming shift  Nancy Fetter to follow-up on lab work and imaging.  Portions of this report may have been transcribed using voice recognition software. Every effort was made to ensure accuracy; however, inadvertent computerized transcription errors may be present.   Final Clinical Impression(s) / ED Diagnoses Final diagnoses:  Hidradenitis suppurativa  Urinary frequency    Rx / DC Orders ED Discharge Orders          Ordered    amoxicillin-clavulanate (AUGMENTIN) 875-125 MG tablet  Every 12 hours        05/31/22 1916              Achille Rich, PA-C 06/03/22 1609    Achille Rich, PA-C 06/03/22 1610    Loetta Rough, MD 06/06/22 1555

## 2022-05-31 NOTE — Discharge Instructions (Signed)
Your workup today was reassuring.  CT scan did not show any concerning findings for your abdominal pain.  Could be secondary to the UTI given some of the findings on your urine.  We will send a urine culture to confirm.  I have given the antibiotic that will cover both for UTI, and infection in the armpit.  For any concerning symptoms return to the emergency department.  Follow-up with your primary care provider.  Have also given information for gynecology.

## 2022-06-02 LAB — URINE CULTURE
